# Patient Record
Sex: Female | Born: 1970 | Race: Black or African American | Hispanic: No | Marital: Married | Smoking: Never smoker
Health system: Southern US, Community
[De-identification: ages and names within clinical notes are randomized; demographics above are authoritative.]

## PROBLEM LIST (undated history)

## (undated) DIAGNOSIS — K56609 Unspecified intestinal obstruction, unspecified as to partial versus complete obstruction: Secondary | ICD-10-CM

## (undated) DIAGNOSIS — F329 Major depressive disorder, single episode, unspecified: Secondary | ICD-10-CM

## (undated) DIAGNOSIS — K219 Gastro-esophageal reflux disease without esophagitis: Secondary | ICD-10-CM

## (undated) DIAGNOSIS — D219 Benign neoplasm of connective and other soft tissue, unspecified: Secondary | ICD-10-CM

## (undated) DIAGNOSIS — F32A Depression, unspecified: Secondary | ICD-10-CM

## (undated) DIAGNOSIS — R0602 Shortness of breath: Secondary | ICD-10-CM

## (undated) DIAGNOSIS — G4730 Sleep apnea, unspecified: Secondary | ICD-10-CM

## (undated) DIAGNOSIS — J45909 Unspecified asthma, uncomplicated: Secondary | ICD-10-CM

## (undated) DIAGNOSIS — C801 Malignant (primary) neoplasm, unspecified: Secondary | ICD-10-CM

## (undated) DIAGNOSIS — E669 Obesity, unspecified: Secondary | ICD-10-CM

## (undated) DIAGNOSIS — K589 Irritable bowel syndrome without diarrhea: Secondary | ICD-10-CM

## (undated) DIAGNOSIS — D649 Anemia, unspecified: Secondary | ICD-10-CM

## (undated) DIAGNOSIS — J302 Other seasonal allergic rhinitis: Secondary | ICD-10-CM

## (undated) DIAGNOSIS — G2581 Restless legs syndrome: Secondary | ICD-10-CM

## (undated) DIAGNOSIS — R112 Nausea with vomiting, unspecified: Secondary | ICD-10-CM

## (undated) DIAGNOSIS — C49A0 Gastrointestinal stromal tumor, unspecified site: Secondary | ICD-10-CM

## (undated) DIAGNOSIS — R51 Headache: Secondary | ICD-10-CM

## (undated) DIAGNOSIS — F419 Anxiety disorder, unspecified: Secondary | ICD-10-CM

## (undated) HISTORY — DX: Unspecified intestinal obstruction, unspecified as to partial versus complete obstruction: K56.609

## (undated) HISTORY — DX: Sleep apnea, unspecified: G47.30

## (undated) HISTORY — DX: Gastrointestinal stromal tumor, unspecified site: C49.A0

## (undated) HISTORY — PX: CHOLECYSTECTOMY: SHX55

## (undated) HISTORY — PX: GASTRECTOMY: SHX58

## (undated) HISTORY — PX: LAPAROSCOPIC GASTRIC SLEEVE RESECTION: SHX5895

## (undated) HISTORY — DX: Nausea with vomiting, unspecified: R11.2

## (undated) HISTORY — DX: Irritable bowel syndrome, unspecified: K58.9

## (undated) HISTORY — DX: Benign neoplasm of connective and other soft tissue, unspecified: D21.9

## (undated) HISTORY — DX: Obesity, unspecified: E66.9

---

## 1998-08-01 ENCOUNTER — Encounter: Payer: Self-pay | Admitting: Emergency Medicine

## 1998-08-01 ENCOUNTER — Emergency Department (HOSPITAL_COMMUNITY): Admission: EM | Admit: 1998-08-01 | Discharge: 1998-08-01 | Payer: Self-pay | Admitting: Emergency Medicine

## 1998-10-04 ENCOUNTER — Emergency Department (HOSPITAL_COMMUNITY): Admission: EM | Admit: 1998-10-04 | Discharge: 1998-10-04 | Payer: Self-pay | Admitting: Emergency Medicine

## 2000-01-31 ENCOUNTER — Emergency Department (HOSPITAL_COMMUNITY): Admission: EM | Admit: 2000-01-31 | Discharge: 2000-01-31 | Payer: Self-pay | Admitting: Emergency Medicine

## 2000-08-10 ENCOUNTER — Emergency Department (HOSPITAL_COMMUNITY): Admission: EM | Admit: 2000-08-10 | Discharge: 2000-08-10 | Payer: Self-pay

## 2000-11-24 ENCOUNTER — Encounter: Payer: Self-pay | Admitting: Emergency Medicine

## 2000-11-24 ENCOUNTER — Emergency Department (HOSPITAL_COMMUNITY): Admission: EM | Admit: 2000-11-24 | Discharge: 2000-11-24 | Payer: Self-pay | Admitting: Emergency Medicine

## 2000-12-06 ENCOUNTER — Emergency Department (HOSPITAL_COMMUNITY): Admission: EM | Admit: 2000-12-06 | Discharge: 2000-12-06 | Payer: Self-pay | Admitting: Emergency Medicine

## 2001-04-19 ENCOUNTER — Emergency Department (HOSPITAL_COMMUNITY): Admission: EM | Admit: 2001-04-19 | Discharge: 2001-04-20 | Payer: Self-pay | Admitting: Emergency Medicine

## 2001-06-25 ENCOUNTER — Emergency Department (HOSPITAL_COMMUNITY): Admission: EM | Admit: 2001-06-25 | Discharge: 2001-06-25 | Payer: Self-pay | Admitting: Emergency Medicine

## 2001-11-28 ENCOUNTER — Emergency Department (HOSPITAL_COMMUNITY): Admission: EM | Admit: 2001-11-28 | Discharge: 2001-11-28 | Payer: Self-pay | Admitting: *Deleted

## 2001-11-29 ENCOUNTER — Emergency Department (HOSPITAL_COMMUNITY): Admission: EM | Admit: 2001-11-29 | Discharge: 2001-11-29 | Payer: Self-pay | Admitting: Emergency Medicine

## 2002-03-12 ENCOUNTER — Emergency Department (HOSPITAL_COMMUNITY): Admission: EM | Admit: 2002-03-12 | Discharge: 2002-03-12 | Payer: Self-pay | Admitting: Emergency Medicine

## 2002-03-13 ENCOUNTER — Emergency Department (HOSPITAL_COMMUNITY): Admission: EM | Admit: 2002-03-13 | Discharge: 2002-03-13 | Payer: Self-pay | Admitting: Emergency Medicine

## 2002-06-16 ENCOUNTER — Emergency Department (HOSPITAL_COMMUNITY): Admission: EM | Admit: 2002-06-16 | Discharge: 2002-06-16 | Payer: Self-pay | Admitting: Emergency Medicine

## 2004-03-05 ENCOUNTER — Other Ambulatory Visit: Admission: RE | Admit: 2004-03-05 | Discharge: 2004-03-05 | Payer: Self-pay | Admitting: Family Medicine

## 2005-01-17 ENCOUNTER — Emergency Department (HOSPITAL_COMMUNITY): Admission: EM | Admit: 2005-01-17 | Discharge: 2005-01-17 | Payer: Self-pay | Admitting: Emergency Medicine

## 2005-01-21 ENCOUNTER — Ambulatory Visit: Payer: Self-pay | Admitting: Family Medicine

## 2005-02-04 ENCOUNTER — Encounter: Admission: RE | Admit: 2005-02-04 | Discharge: 2005-02-04 | Payer: Self-pay | Admitting: Family Medicine

## 2005-02-08 ENCOUNTER — Ambulatory Visit: Payer: Self-pay | Admitting: Family Medicine

## 2005-02-15 ENCOUNTER — Ambulatory Visit: Payer: Self-pay | Admitting: Family Medicine

## 2005-04-15 ENCOUNTER — Emergency Department (HOSPITAL_COMMUNITY): Admission: EM | Admit: 2005-04-15 | Discharge: 2005-04-15 | Payer: Self-pay | Admitting: Emergency Medicine

## 2005-12-02 ENCOUNTER — Emergency Department (HOSPITAL_COMMUNITY): Admission: EM | Admit: 2005-12-02 | Discharge: 2005-12-03 | Payer: Self-pay | Admitting: *Deleted

## 2006-03-09 ENCOUNTER — Emergency Department (HOSPITAL_COMMUNITY): Admission: EM | Admit: 2006-03-09 | Discharge: 2006-03-09 | Payer: Self-pay | Admitting: Emergency Medicine

## 2006-08-25 ENCOUNTER — Inpatient Hospital Stay (HOSPITAL_COMMUNITY): Admission: AD | Admit: 2006-08-25 | Discharge: 2006-08-25 | Payer: Self-pay | Admitting: Gynecology

## 2006-08-28 ENCOUNTER — Inpatient Hospital Stay (HOSPITAL_COMMUNITY): Admission: AD | Admit: 2006-08-28 | Discharge: 2006-08-28 | Payer: Self-pay | Admitting: Obstetrics and Gynecology

## 2006-09-11 ENCOUNTER — Ambulatory Visit (HOSPITAL_COMMUNITY): Admission: RE | Admit: 2006-09-11 | Discharge: 2006-09-11 | Payer: Self-pay | Admitting: Gynecology

## 2007-01-04 ENCOUNTER — Encounter (INDEPENDENT_AMBULATORY_CARE_PROVIDER_SITE_OTHER): Payer: Self-pay | Admitting: *Deleted

## 2007-01-04 ENCOUNTER — Inpatient Hospital Stay (HOSPITAL_COMMUNITY): Admission: AD | Admit: 2007-01-04 | Discharge: 2007-01-05 | Payer: Self-pay | Admitting: Obstetrics & Gynecology

## 2008-08-18 ENCOUNTER — Emergency Department (HOSPITAL_COMMUNITY): Admission: EM | Admit: 2008-08-18 | Discharge: 2008-08-18 | Payer: Self-pay | Admitting: Emergency Medicine

## 2009-04-16 ENCOUNTER — Encounter: Admission: RE | Admit: 2009-04-16 | Discharge: 2009-04-16 | Payer: Self-pay | Admitting: Family Medicine

## 2010-05-27 ENCOUNTER — Emergency Department (HOSPITAL_COMMUNITY): Admission: EM | Admit: 2010-05-27 | Discharge: 2010-05-27 | Payer: Self-pay | Admitting: Emergency Medicine

## 2011-11-25 ENCOUNTER — Other Ambulatory Visit (HOSPITAL_COMMUNITY)
Admission: RE | Admit: 2011-11-25 | Discharge: 2011-11-25 | Disposition: A | Discharge status: Home / Self Care | Payer: BC Managed Care – PPO | Source: Ambulatory Visit | Attending: Gynecology | Admitting: Gynecology

## 2011-11-25 ENCOUNTER — Ambulatory Visit (INDEPENDENT_AMBULATORY_CARE_PROVIDER_SITE_OTHER): Payer: BC Managed Care – PPO | Admitting: Gynecology

## 2011-11-25 ENCOUNTER — Encounter: Payer: Self-pay | Admitting: Gynecology

## 2011-11-25 VITALS — BP 130/80 | Ht 63.0 in | Wt 309.0 lb

## 2011-11-25 DIAGNOSIS — N946 Dysmenorrhea, unspecified: Secondary | ICD-10-CM

## 2011-11-25 DIAGNOSIS — N920 Excessive and frequent menstruation with regular cycle: Secondary | ICD-10-CM

## 2011-11-25 DIAGNOSIS — Z131 Encounter for screening for diabetes mellitus: Secondary | ICD-10-CM

## 2011-11-25 DIAGNOSIS — D219 Benign neoplasm of connective and other soft tissue, unspecified: Secondary | ICD-10-CM | POA: Insufficient documentation

## 2011-11-25 DIAGNOSIS — Z01419 Encounter for gynecological examination (general) (routine) without abnormal findings: Secondary | ICD-10-CM

## 2011-11-25 DIAGNOSIS — Z13220 Encounter for screening for lipoid disorders: Secondary | ICD-10-CM

## 2011-11-25 DIAGNOSIS — D259 Leiomyoma of uterus, unspecified: Secondary | ICD-10-CM

## 2011-11-25 LAB — URINALYSIS W MICROSCOPIC + REFLEX CULTURE
Leukocytes, UA: NEGATIVE
Protein, ur: NEGATIVE mg/dL
Urobilinogen, UA: 0.2 mg/dL (ref 0.0–1.0)

## 2011-11-25 LAB — CBC WITH DIFFERENTIAL/PLATELET
Basophils Relative: 0 % (ref 0–1)
Eosinophils Absolute: 0.4 10*3/uL (ref 0.0–0.7)
HCT: 28.7 % — ABNORMAL LOW (ref 36.0–46.0)
Hemoglobin: 8.4 g/dL — ABNORMAL LOW (ref 12.0–15.0)
MCH: 21.2 pg — ABNORMAL LOW (ref 26.0–34.0)
MCHC: 29.3 g/dL — ABNORMAL LOW (ref 30.0–36.0)
Monocytes Absolute: 0.5 10*3/uL (ref 0.1–1.0)
Monocytes Relative: 8 % (ref 3–12)

## 2011-11-25 LAB — LIPID PANEL
Total CHOL/HDL Ratio: 2.9 Ratio
VLDL: 11 mg/dL (ref 0–40)

## 2011-11-25 LAB — GLUCOSE, RANDOM: Glucose, Bld: 83 mg/dL (ref 70–99)

## 2011-11-25 NOTE — Patient Instructions (Signed)
Follow up for your ultrasound study and blood work results.

## 2011-11-25 NOTE — Progress Notes (Signed)
Stacy Hill [birth year] [id number]        41 y.o. new patient for annual exam.  Has several issues as noted below  Past medical history,surgical history, medications, allergies, family history and social history were all reviewed and documented in the EPIC chart. ROS:  Was performed and pertinent positives and negatives are included in the history.  Exam: Kim chaperone present Filed Vitals:   11/25/11 0959  BP: 130/80   General appearance  Normal Skin grossly normal Head/Neck normal with no cervical or supraclavicular adenopathy thyroid normal Lungs  clear Cardiac RR, without RMG Abdominal  soft, nontender, midline 18 week mass consistent with leiomyoma, no organomegaly or hernia Breasts  examined lying and sitting without masses, retractions, discharge or axillary adenopathy. Pelvic  Ext/BUS/vagina  normal   Cervix  normal  Pap done  Uterus  18 weeks a regular consistent with leiomyoma, midline and well baby nontender   Adnexa difficult to evaluate due to uterine size  Anus and perineum  normal   Rectovaginal  normal sphincter tone without palpated masses or tenderness.    Assessment/Plan:  41 y.o. female for annual exam.    1. Leiomyoma. Patient has history of leiomyoma last evaluated 4 years ago. Says her periods are getting worse more crampy and heavier where she wears double protection has bleed through episodes. Exam consistent with 18 week size uterus. We'll start with sonohysterogram although her cervix was somewhat distorted pointing posteriorly which I think is due to the fibroids which may make this more difficult.  Options for management to include observation hormonal manipulation Mirena IUD uterine artery embolization myomectomy and hysterectomy were all discussed. She does not feel childbearing is an issue. Her last pregnancy ended in stillbirth which was traumatic to her and she's not interested in pursuing pregnancy at this point. Possible Depo-Lupron suppression  temporarily or try low-dose oral contraceptives discussed. The side effect profile of the Depo-Lupron was reviewed and she actually  did have a course of Depo-Lupron before and did not like the side effects. Low-dose oral contraceptives was also discussed the side effect risk profile reviewed to include stroke heart attack DVT. She is not being followed for any medical issues does not smoke and is thinking about temporarily trying this. Patient will follow up for her sonohysterogram and then we'll rediscuss options. I'm also checking a TSH and CBC given her menorrhagia history. 2. Birth control. We discussed condom use and the potential for failure and risk of pregnancy. We'll further discuss options after the ultrasound I did review in general options available to her. 3. Pap smear. Pap smear was done today as she has not had one done in a number of years. She has no history of abnormalities previously. 4. Breast self. Screening mammography between 35 and 40 was reviewed. She has no strong family history firstly closer to 40. SBE monthly discussed. 5. Weight loss. Patient is planning on attempting a weight loss regimen and I encouraged her to do so. 6. Health maintenance. We'll check baseline lipid profile glucose hemoglobin A1c and urinalysis along with her CBC and TSH.  Dara Lords MD, 11:10 AM 11/25/2011

## 2011-11-28 ENCOUNTER — Other Ambulatory Visit: Payer: Self-pay | Admitting: *Deleted

## 2011-11-28 DIAGNOSIS — D582 Other hemoglobinopathies: Secondary | ICD-10-CM

## 2011-11-30 ENCOUNTER — Telehealth: Payer: Self-pay | Admitting: *Deleted

## 2011-11-30 DIAGNOSIS — D219 Benign neoplasm of connective and other soft tissue, unspecified: Secondary | ICD-10-CM

## 2011-11-30 NOTE — Telephone Encounter (Signed)
Pt is a new patient with practice had office visit on 2/8. She is scheduled for [facility] on 2/22, pt said that she will have to pay $500 due to insurance. Pt would like to know if there is any other ultrasound that could be done? Please advise

## 2011-12-01 NOTE — Telephone Encounter (Signed)
Pt informed with the below note, order in computer for ultrasound.

## 2011-12-01 NOTE — Telephone Encounter (Signed)
We can do just a regular ultrasound as given her clinical picture I don't think that the sonohysterogram part of it will ultimately change the recommendations.

## 2011-12-09 ENCOUNTER — Other Ambulatory Visit: Payer: BC Managed Care – PPO

## 2011-12-09 ENCOUNTER — Other Ambulatory Visit (HOSPITAL_COMMUNITY)
Admission: RE | Admit: 2011-12-09 | Discharge: 2011-12-09 | Disposition: A | Discharge status: Home / Self Care | Payer: BC Managed Care – PPO | Source: Ambulatory Visit | Attending: Gynecology | Admitting: Gynecology

## 2011-12-09 ENCOUNTER — Encounter: Payer: Self-pay | Admitting: Gynecology

## 2011-12-09 ENCOUNTER — Other Ambulatory Visit: Payer: Self-pay | Admitting: Gynecology

## 2011-12-09 ENCOUNTER — Ambulatory Visit (INDEPENDENT_AMBULATORY_CARE_PROVIDER_SITE_OTHER): Payer: BC Managed Care – PPO

## 2011-12-09 ENCOUNTER — Ambulatory Visit: Payer: BC Managed Care – PPO | Admitting: Gynecology

## 2011-12-09 ENCOUNTER — Ambulatory Visit (INDEPENDENT_AMBULATORY_CARE_PROVIDER_SITE_OTHER): Payer: BC Managed Care – PPO | Admitting: Gynecology

## 2011-12-09 DIAGNOSIS — Z01419 Encounter for gynecological examination (general) (routine) without abnormal findings: Secondary | ICD-10-CM | POA: Insufficient documentation

## 2011-12-09 DIAGNOSIS — D219 Benign neoplasm of connective and other soft tissue, unspecified: Secondary | ICD-10-CM

## 2011-12-09 DIAGNOSIS — N920 Excessive and frequent menstruation with regular cycle: Secondary | ICD-10-CM

## 2011-12-09 DIAGNOSIS — D259 Leiomyoma of uterus, unspecified: Secondary | ICD-10-CM

## 2011-12-09 DIAGNOSIS — R87616 Satisfactory cervical smear but lacking transformation zone: Secondary | ICD-10-CM

## 2011-12-09 DIAGNOSIS — N83209 Unspecified ovarian cyst, unspecified side: Secondary | ICD-10-CM

## 2011-12-09 MED ORDER — ETONOGESTREL-ETHINYL ESTRADIOL 0.12-0.015 MG/24HR VA RING: VAGINAL_RING | VAGINAL | Status: DC

## 2011-12-09 NOTE — Progress Notes (Signed)
  Patient follows up with to issues. The first is her Pap smear needs to be repeated as it had insufficient endocervical cells. Second is her follow up ultrasound due to her leiomyoma menorrhagia. She was initially scheduled for a sonohysterogram but only had the ultrasound done but I think in retrospect that's fine. The ultrasound shows an endometrial echo of 9.0 mm and a single large posterior myoma at 122 mm. Right ovary is normal left ovary has an echo-free thin-walled avascular cyst 30 mm mean. Internal echoes suggestive of hemorrhage versus endometrioma.  Exam with Elane Fritz chaperone present Abdomen with palpable uterus suprapubically. Pelvic external BUS vagina normal. Cervix normal Pap done. Uterus enlarged midline. Adnexa without gross masses.  Assessment and plan: 1. Pap redone. Assuming negative and follow expectantly. 2. Single large leiomyoma. Reviewed options with her to include attempted hormonal manipulation, Lupron suppression, Mirena IUD, myomectomy either robotic attempted versus open, uterine artery embolization, hysterectomy. Patient is interested in at least hearing about robotic myomectomy on going to refer her for this discussion. I reviewed with her that if I did an open myomectomy or hysterectomy that I would do her through a midline incision. She relates that she did have a Pfannenstiel with her C-section and became infected and took months to heal. 3. Contraception. Patient requested contraception. I reviewed options with her to include pill patch ring, Mirena IUD, Depo-Provera, Nexplanon. She's not defaulting medical issues does not smoke does not have hypertension or diabetes. She wants to try the NuvaRing I gave her sample ring with annual refill a starter pack and instructions how to use it. 4. Ovarian cyst. I reviewed the left ovarian cyst. I think it's either a hemorrhagic cyst possible endometrioma. Possibilities of 2 including cancer were reviewed. The recommendation would  be to repeat the ultrasound in 3 months if she does not have surgery before then. If it would enlarge and consider surgery. She agrees with this and the importance of follow up.

## 2011-12-09 NOTE — Patient Instructions (Signed)
Office will contact you to arrange surgical consult in reference to your leiomyoma. Start NuvaRing call if you have any questions or problems. Use backup contraception for at least the first month. If you do not have surgery sooner follow up in 3 months for ultrasound to recheck your left ovary.

## 2011-12-16 ENCOUNTER — Telehealth: Payer: Self-pay | Admitting: *Deleted

## 2011-12-16 NOTE — Telephone Encounter (Signed)
appt set up on 12/21/11 @2 :30 with Dr. Mia Creek.  Patient has been informed by their office.

## 2011-12-16 NOTE — Telephone Encounter (Signed)
Message copied by Libby Maw on Fri Dec 16, 2011 12:08 PM ------      Message from: Dara Lords      Created: Fri Dec 09, 2011  4:22 PM       Patient has single large fibroid and is interested in hearing about robotic myomectomy versus robotic hysterectomy. Make an appointment for her to see Dr. Mia Creek at [facility]

## 2012-01-04 ENCOUNTER — Telehealth: Payer: Self-pay | Admitting: *Deleted

## 2012-01-04 NOTE — Telephone Encounter (Signed)
Patient called to say she was not satisfied with the visit with Dr. Mia Creek and has many more questions and would like to see TF.  Appts to set up ov.

## 2012-01-11 ENCOUNTER — Encounter: Payer: Self-pay | Admitting: Gynecology

## 2012-01-11 ENCOUNTER — Ambulatory Visit (INDEPENDENT_AMBULATORY_CARE_PROVIDER_SITE_OTHER): Payer: BC Managed Care – PPO | Admitting: Gynecology

## 2012-01-11 DIAGNOSIS — D259 Leiomyoma of uterus, unspecified: Secondary | ICD-10-CM

## 2012-01-11 DIAGNOSIS — D500 Iron deficiency anemia secondary to blood loss (chronic): Secondary | ICD-10-CM

## 2012-01-11 DIAGNOSIS — N920 Excessive and frequent menstruation with regular cycle: Secondary | ICD-10-CM

## 2012-01-11 NOTE — Patient Instructions (Signed)
Office will contact you about starting Depo-Lupron. Continue with your iron one pill daily. Use backup contraception while you were on Depo-Lupron.

## 2012-01-11 NOTE — Progress Notes (Signed)
Patient presents having seen Dr. Mia Hill in reference to robotic hysterectomy. I received a copy of his office nowhere he had seen her that she was a candidate for by hysterectomy next that are scheduled for next week. He then called her back and said that he wanted her to lose weight first before proceeding with surgery and canceled her surgery. She was a little put off by this and wanted to check with me.  Exam was Stacy Hill chaperone present Abdomen with palpable uterus at umbilicus Pelvic external BUS vagina normal. Cervix grossly normal. Uterus grossly enlarged prostate 18 week size midline. Adnexa without gross masses.  Assessment and plan: Leiomyoma, iron deficiency anemia, menorrhagia with irregular bleeding. He did do a endometrial biopsy by her history I don't have a copy of this am going to request this. I reviewed options with her and her plan is to lose weight and then proceed with robotic hysterectomy. Apparently her sister had this done at [facility] she's to follow up with that physician to consider this. I did suggest Lupron in the antrum to suppress her bleeding as well as for hemoglobin recovery and she wants to go ahead and do this. We'll make arrangements for Depo-Lupron 11.25. The need for backup contraception on the Lupron was discussed with her. She does have significant hot flashes or symptoms which I discussed with her that we could start her on norethindrone she'll call me. The issues of transient loss of calcium from the bones was also reviewed with her.

## 2012-01-18 ENCOUNTER — Telehealth: Payer: Self-pay | Admitting: *Deleted

## 2012-01-18 NOTE — Telephone Encounter (Signed)
Lupron request sent by [facility] 01/12/12; yesterday I had to fax an order to [facility] per Insurance.

## 2012-01-20 NOTE — Telephone Encounter (Signed)
Pt informed of delivery date 01/24/12. She wants a call once it is here to set up a time to get shot. KW

## 2012-01-24 ENCOUNTER — Other Ambulatory Visit: Payer: Self-pay | Admitting: *Deleted

## 2012-01-24 MED ORDER — LEUPROLIDE ACETATE (3 MONTH) 11.25 MG IM KIT
11.2500 mg | PACK | Freq: Once | INTRAMUSCULAR | Status: AC
  Administered 2012-01-30: 11.25 mg via INTRAMUSCULAR

## 2012-01-24 NOTE — Telephone Encounter (Signed)
Lupron arrived, appt set up 01/27/12 @4pm .

## 2012-01-27 ENCOUNTER — Ambulatory Visit: Payer: BC Managed Care – PPO

## 2012-01-30 ENCOUNTER — Ambulatory Visit (INDEPENDENT_AMBULATORY_CARE_PROVIDER_SITE_OTHER): Payer: BC Managed Care – PPO | Admitting: *Deleted

## 2012-01-30 DIAGNOSIS — D219 Benign neoplasm of connective and other soft tissue, unspecified: Secondary | ICD-10-CM

## 2012-01-30 DIAGNOSIS — D259 Leiomyoma of uterus, unspecified: Secondary | ICD-10-CM

## 2012-02-17 ENCOUNTER — Telehealth: Payer: Self-pay | Admitting: *Deleted

## 2012-02-17 NOTE — Telephone Encounter (Signed)
Dr Audie Box received a letter from [facility] regarding patients recent Lupron injection and it contraindicating with her recent pregnancy. Dr Audie Box asked me to follow up on this with the patient. I called the patient to verify that she isnt pregnant or recently been pregnant without our knowledge. She stated "no". I went onto tell her why I was asking and therefore I called [facility] and they said they are forwarded diagnosis codes from her medical insurance. I Spoke with Ree Kida at [employer] and he documented that the patient was not pregnant. I called the patient back and told her that she would need to follow up with her medical insurance plan to look at those DOS. 11/04/11 and 11/08/11. These are the dates that [facility] have the diagnosis of pregnancy. The patient said she would call. She states she is on her first period since receiving the Lupron injection. I told her if its abnormal or has irregular bleeding then to let us know other wise this period is normal.

## 2012-05-23 ENCOUNTER — Ambulatory Visit (HOSPITAL_BASED_OUTPATIENT_CLINIC_OR_DEPARTMENT_OTHER): Payer: BC Managed Care – PPO | Attending: Internal Medicine | Admitting: Radiology

## 2012-05-23 VITALS — Ht 63.0 in | Wt 297.0 lb

## 2012-05-23 DIAGNOSIS — G4700 Insomnia, unspecified: Secondary | ICD-10-CM | POA: Insufficient documentation

## 2012-05-23 DIAGNOSIS — G4733 Obstructive sleep apnea (adult) (pediatric): Secondary | ICD-10-CM

## 2012-05-23 DIAGNOSIS — G4730 Sleep apnea, unspecified: Secondary | ICD-10-CM | POA: Insufficient documentation

## 2012-05-23 DIAGNOSIS — G4761 Periodic limb movement disorder: Secondary | ICD-10-CM | POA: Insufficient documentation

## 2012-06-03 DIAGNOSIS — G4730 Sleep apnea, unspecified: Secondary | ICD-10-CM

## 2012-06-03 DIAGNOSIS — G4761 Periodic limb movement disorder: Secondary | ICD-10-CM

## 2012-06-03 DIAGNOSIS — G4700 Insomnia, unspecified: Secondary | ICD-10-CM

## 2012-06-04 NOTE — Procedures (Signed)
NAME:  Stacy Hill, Stacy Hill           ACCOUNT NO.:  [id number]  MEDICAL RECORD NO.:  [id number]          PATIENT TYPE:  OUT  LOCATION:  SLEEP CENTER                 FACILITY:  [facility]  PHYSICIAN:  Breann Losano D. Maple Hudson, MD, FCCP, FACPDATE OF BIRTH:  25-Feb-1971  DATE OF STUDY:  05/23/2012                           NOCTURNAL POLYSOMNOGRAM  REFERRING PHYSICIAN:  Donnel Saxon, MD  INDICATION FOR STUDY:  Insomnia with sleep apnea.  EPWORTH SLEEPINESS SCORE:  11/24, BMI 52.6, weight 297 pounds, height 63 inches, neck 13 inches.  MEDICATIONS:  Home medications are charted and reviewed.  SLEEP ARCHITECTURE:  Total sleep time 283 minutes with sleep efficiency 79.2%.  Stage I was 10.4%, stage II 71.6%.  Stage III 0.2%, REM 17.8% of total sleep time.  Sleep latency 31.5 minutes, REM latency 179.5 minutes, awake after sleep onset 34.5 minutes.  Arousal index 23.1.  Bedtime Medication:  None.  RESPIRATORY DATA:  Apnea hypopnea index (AHI) 1.1 per hour.  A total of 5 events was scored including 2 central apneas and 3 hypopneas.  Events were not positional.  REM AHI 2.4 per hour.  OXYGEN DATA:  Snoring was absent to occasionally moderate with oxygen desaturation to a nadir of 92% and mean oxygen saturation on room air 98.1%.  CARDIAC DATA:  Normal sinus rhythm.  MOVEMENT-PARASOMNIA:  A total of 46 limb jerks were counted, of which 25 were associated with arousals or awakenings for periodic limb movement with arousal index of 5.3 per hour.  No bathroom trips.  IMPRESSION-RECOMMENDATIONS: 1. Occasional respiratory events with sleep disturbance, within normal     limits.  AHI 1.1 per hour (the normal range for adult is from 0-5     events per hour).  Occasional snoring was moderate with oxygen     desaturation to a nadir of 92% and mean oxygen saturation through     the study of 98.1% on room air.  This was ordered as a diagnostic     NPSG protocol and CPAP titration was not done. 2. Periodic limb  movement with arousal syndrome.  A total of 46 limb     jerks were counted, of which 25 were associated with arousals or     awakening for periodic limb movements with arousal index of 5.3 per     hour.  If     limb jerks are a significant cause of sleep disturbance in the home     environment, then a therapeutic trial of specific medication such     as ReQuip or Mirapex might be appropriate if clinically indicated.     Enid Maultsby D. Maple Hudson, MD, [facility], FACP Diplomate, American Board of Sleep Medicine    CDY/MEDQ  D:  06/03/2012 13:15:23  T:  06/04/2012 03:19:56  Job:  [id number]

## 2012-12-10 ENCOUNTER — Encounter: Payer: Self-pay | Admitting: Gynecology

## 2012-12-10 ENCOUNTER — Ambulatory Visit (INDEPENDENT_AMBULATORY_CARE_PROVIDER_SITE_OTHER): Payer: BC Managed Care – PPO | Admitting: Gynecology

## 2012-12-10 VITALS — BP 120/78 | Ht 63.0 in | Wt 296.0 lb

## 2012-12-10 DIAGNOSIS — Z13220 Encounter for screening for lipoid disorders: Secondary | ICD-10-CM

## 2012-12-10 DIAGNOSIS — Z01419 Encounter for gynecological examination (general) (routine) without abnormal findings: Secondary | ICD-10-CM

## 2012-12-10 DIAGNOSIS — D259 Leiomyoma of uterus, unspecified: Secondary | ICD-10-CM

## 2012-12-10 LAB — CBC WITH DIFFERENTIAL/PLATELET
Eosinophils Absolute: 0.2 10*3/uL (ref 0.0–0.7)
Eosinophils Relative: 4 % (ref 0–5)
Lymphs Abs: 2.5 10*3/uL (ref 0.7–4.0)
MCH: 25.5 pg — ABNORMAL LOW (ref 26.0–34.0)
MCV: 80.1 fL (ref 78.0–100.0)
Monocytes Absolute: 0.3 10*3/uL (ref 0.1–1.0)
Platelets: 330 10*3/uL (ref 150–400)
RBC: 4.28 MIL/uL (ref 3.87–5.11)

## 2012-12-10 NOTE — Patient Instructions (Signed)
Follow up for ultrasound as scheduled 

## 2012-12-10 NOTE — Progress Notes (Signed)
Stacy Hill January 10, 1971 [id number]        42 y.o.  G2P1101 for annual exam.  Several issues noted below.  Past medical history,surgical history, medications, allergies, family history and social history were all reviewed and documented in the EPIC chart. ROS:  Was performed and pertinent positives and negatives are included in the history.  Exam: Kim assistant Filed Vitals:   12/10/12 1000  BP: 120/78  Height: 5\' 3"  (1.6 m)  Weight: 296 lb (134.265 kg)   General appearance  Normal Skin grossly normal Head/Neck normal with no cervical or supraclavicular adenopathy thyroid normal Lungs  clear Cardiac RR, without RMG Abdominal  Obese, soft, nontender, without masses, organomegaly or hernia. Breasts  examined lying and sitting without masses, retractions, discharge or axillary adenopathy. Pelvic  Ext/BUS/vagina  normal ]\  Cervix  normal   Uterus  Grossly enlarged difficult to palpate due to obesity.  Adnexa  Difficult to assess. No gross masses or tenderness   Anus and perineum  normal   Rectovaginal  normal sphincter tone without palpated masses or tenderness.    Assessment/Plan:  42 y.o. G72P1101 female for annual exam.   1. Menorrhagia/frequent menses. History of large single myoma 12 cm. Had seen Dr. Pura Spice with tentative plans for robotic hysterectomy but ultimately was canceled and the patient was told to lose weight and then return. Periods still continue heavy and frequent. Check CBC TSH today. I again reviewed options of myomectomy versus hysterectomy. She strongly wants to proceed with hysterectomy. I reviewed abdominal versus attempted robotic. She did have a cesarean section with wound breakdown via Pfannenstiel incision afterwards.  I reviewed the options of midline incision but the risk of wound complications following the procedure. Patient wants to pursue attempted robotic and I will discuss with robotic surgeons to see where I can refer her.  Will repeat  ultrasound now for pelvic assessment as exam is limited. 2. Mammography. Patient turns 42 this year I recommended screening mammogram as she agrees to arrange this. SBE monthly reviewed. 3. Pap smear or 2013. No Pap smear done today. No history of abnormal Pap smears previously. Plan 3 year interval repeat. 4. Health maintenance. Will check baseline CBC, comprehensive metabolic panel, TSH urinalysis. Follow up for ultrasound.    Dara Lords MD, 11:15 AM 12/10/2012

## 2012-12-11 LAB — URINALYSIS W MICROSCOPIC + REFLEX CULTURE
Bacteria, UA: NONE SEEN
Bilirubin Urine: NEGATIVE
Crystals: NONE SEEN
Ketones, ur: NEGATIVE mg/dL
Nitrite: NEGATIVE
Specific Gravity, Urine: 1.025 (ref 1.005–1.030)
pH: 6 (ref 5.0–8.0)

## 2012-12-11 LAB — LIPID PANEL
Cholesterol: 126 mg/dL (ref 0–200)
HDL: 41 mg/dL (ref >39)
Total CHOL/HDL Ratio: 3.1 Ratio
Triglycerides: 53 mg/dL (ref <150)
VLDL: 11 mg/dL (ref 0–40)

## 2012-12-11 LAB — COMPREHENSIVE METABOLIC PANEL
ALT: 14 U/L (ref 0–35)
BUN: 10 mg/dL (ref 6–23)
CO2: 23 mEq/L (ref 19–32)
Creat: 0.54 mg/dL (ref 0.50–1.10)
Glucose, Bld: 81 mg/dL (ref 70–99)
Total Bilirubin: 0.3 mg/dL (ref 0.3–1.2)

## 2012-12-11 LAB — URINE CULTURE: Organism ID, Bacteria: NO GROWTH

## 2012-12-19 ENCOUNTER — Other Ambulatory Visit: Payer: BC Managed Care – PPO

## 2012-12-19 ENCOUNTER — Ambulatory Visit: Payer: BC Managed Care – PPO | Admitting: Gynecology

## 2012-12-28 ENCOUNTER — Other Ambulatory Visit: Payer: Self-pay | Admitting: Gynecology

## 2012-12-28 ENCOUNTER — Ambulatory Visit: Payer: BC Managed Care – PPO

## 2012-12-28 ENCOUNTER — Encounter: Payer: Self-pay | Admitting: Gynecology

## 2012-12-28 ENCOUNTER — Ambulatory Visit: Payer: BC Managed Care – PPO | Admitting: Gynecology

## 2012-12-28 DIAGNOSIS — D259 Leiomyoma of uterus, unspecified: Secondary | ICD-10-CM

## 2012-12-28 DIAGNOSIS — N852 Hypertrophy of uterus: Secondary | ICD-10-CM

## 2012-12-28 DIAGNOSIS — N920 Excessive and frequent menstruation with regular cycle: Secondary | ICD-10-CM

## 2012-12-28 NOTE — Progress Notes (Signed)
Patient goals up for ultrasound. History of leiomyoma and menorrhagia.  Ultrasound shows uterus enlarged with single myoma measuring 16 mm. Right left ovaries are visualized and normal. Cul-de-sac negative.   Assessment and plan: Large single myoma, menorrhagia wants to proceed with hysterectomy.  I again reviewed options to include medical management versus myomectomy versus hysterectomy she strongly wants to proceed with hysterectomy. I have discussed with Dr. Seymour Bars who has extensive experience with robotic surgery her case and she feels comfortable evaluating her for possible robotic hysterectomy with a large myoma in a morbidly obese patient.  Patient agrees with following up with her for further evaluation and possible surgery. We'll go ahead and help her make these arrangements.

## 2012-12-28 NOTE — Patient Instructions (Signed)
Office will contact you with Dr. Sharol Roussel appointment information.

## 2012-12-31 ENCOUNTER — Telehealth: Payer: Self-pay | Admitting: *Deleted

## 2012-12-31 NOTE — Telephone Encounter (Signed)
appt on 01/16/13 @ 10:30 am unable to leave voicemail on moblie due to vm not set up. Home # is no longer working, and work # no one answers the phone. Will try back later at [facility] #

## 2012-12-31 NOTE — Telephone Encounter (Signed)
Message copied by Aura Camps on Mon Dec 31, 2012 10:38 AM ------      Message from: Dara Lords      Created: Fri Dec 28, 2012  1:05 PM       Help patient arrange appointment with Dr.Lavoie at [facility] reference referral for robotic hysterectomy, obese patient with large myoma. I have talked to Dr. Seymour Bars personally and she agreed to see this patient. ------

## 2012-12-31 NOTE — Telephone Encounter (Signed)
Pt informed with the below note. 

## 2012-12-31 NOTE — Telephone Encounter (Signed)
Notes faxed to office

## 2013-01-17 ENCOUNTER — Other Ambulatory Visit: Payer: Self-pay | Admitting: Obstetrics & Gynecology

## 2013-01-17 DIAGNOSIS — D219 Benign neoplasm of connective and other soft tissue, unspecified: Secondary | ICD-10-CM

## 2013-01-22 ENCOUNTER — Encounter (HOSPITAL_COMMUNITY): Payer: Self-pay | Admitting: Pharmacy Technician

## 2013-01-23 ENCOUNTER — Ambulatory Visit
Admission: RE | Admit: 2013-01-23 | Discharge: 2013-01-23 | Disposition: A | Discharge status: Home / Self Care | Payer: BC Managed Care – PPO | Source: Ambulatory Visit | Attending: Obstetrics & Gynecology | Admitting: Obstetrics & Gynecology

## 2013-01-23 DIAGNOSIS — D219 Benign neoplasm of connective and other soft tissue, unspecified: Secondary | ICD-10-CM

## 2013-01-23 MED ORDER — GADOBENATE DIMEGLUMINE 529 MG/ML IV SOLN
20.0000 mL | Freq: Once | INTRAVENOUS | Status: AC | PRN
  Administered 2013-01-23: 20 mL via INTRAVENOUS

## 2013-01-28 ENCOUNTER — Encounter (HOSPITAL_COMMUNITY)
Admission: RE | Admit: 2013-01-28 | Discharge: 2013-01-28 | Disposition: A | Discharge status: Home / Self Care | Payer: BC Managed Care – PPO | Source: Ambulatory Visit | Attending: Obstetrics & Gynecology | Admitting: Obstetrics & Gynecology

## 2013-01-28 ENCOUNTER — Encounter (HOSPITAL_COMMUNITY): Payer: Self-pay

## 2013-01-28 DIAGNOSIS — Z01818 Encounter for other preprocedural examination: Secondary | ICD-10-CM | POA: Insufficient documentation

## 2013-01-28 DIAGNOSIS — Z01812 Encounter for preprocedural laboratory examination: Secondary | ICD-10-CM | POA: Insufficient documentation

## 2013-01-28 HISTORY — DX: Restless legs syndrome: G25.81

## 2013-01-28 HISTORY — DX: Shortness of breath: R06.02

## 2013-01-28 HISTORY — DX: Gastro-esophageal reflux disease without esophagitis: K21.9

## 2013-01-28 HISTORY — DX: Anemia, unspecified: D64.9

## 2013-01-28 HISTORY — DX: Anxiety disorder, unspecified: F41.9

## 2013-01-28 HISTORY — DX: Depression, unspecified: F32.A

## 2013-01-28 HISTORY — DX: Headache: R51

## 2013-01-28 HISTORY — DX: Major depressive disorder, single episode, unspecified: F32.9

## 2013-01-28 HISTORY — DX: Other seasonal allergic rhinitis: J30.2

## 2013-01-28 LAB — CBC
Hemoglobin: 11.5 g/dL — ABNORMAL LOW (ref 12.0–15.0)
MCH: 27.9 pg (ref 26.0–34.0)
MCHC: 32.4 g/dL (ref 30.0–36.0)
MCV: 86.2 fL (ref 78.0–100.0)
RBC: 4.12 MIL/uL (ref 3.87–5.11)

## 2013-01-28 NOTE — Patient Instructions (Addendum)
    Your procedure is scheduled on:Wednesday, April 30th Enter through the [employer] of [facility] at: [employer] up the phone at the desk and dial [phone] and inform us of your arrival.  Please call this number if you have any problems the morning of surgery: [phone]  Remember: Do not eat or drink anything after midnight on Tuesday. Please take these medications morning of surgery: Prozac with sips of water.  Do not wear jewelry, make-up, or FINGER nail polish No metal in your hair or on your body. Do not wear lotions, powders, perfumes. You may wear deodorant.  Please use your CHG wash as directed prior to surgery.  Do not shave anywhere for at least 12 hours prior to first CHG shower.  Do not bring valuables to the hospital. Contacts may not be worn to OR.  Leave suitcase in the car. After Surgery it may be brought to your room.  For patients being admitted to the hospital, checkout time is 11:00am the day of discharge.   For patients being discharged-you must have a ride home.

## 2013-01-31 ENCOUNTER — Other Ambulatory Visit: Payer: Self-pay | Admitting: Obstetrics & Gynecology

## 2013-02-12 ENCOUNTER — Encounter (HOSPITAL_COMMUNITY): Payer: Self-pay | Admitting: Anesthesiology

## 2013-02-12 MED ORDER — CEFOXITIN SODIUM 2 G IV SOLR
2.0000 g | Freq: Once | INTRAVENOUS | Status: AC
  Administered 2013-02-13: 2 g via INTRAVENOUS
  Filled 2013-02-12: qty 2

## 2013-02-12 NOTE — Anesthesia Preprocedure Evaluation (Addendum)
Anesthesia Evaluation    Airway Mallampati: III TM Distance: >3 FB Neck ROM: Full    Dental no notable dental hx. (+) Teeth Intact   Pulmonary shortness of breath,  breath sounds clear to auscultation  Pulmonary exam normal       Cardiovascular negative cardio ROS  Rhythm:Regular Rate:Normal     Neuro/Psych  Headaches, PSYCHIATRIC DISORDERS Anxiety Depression Restless Legs Syndrome    GI/Hepatic Neg liver ROS, GERD-  Medicated and Controlled,  Endo/Other  Morbid obesity  Renal/GU negative Renal ROS     Musculoskeletal negative musculoskeletal ROS (+)   Abdominal (+) + obese,   Peds  Hematology  (+) Blood dyscrasia, anemia ,   Anesthesia Other Findings   Reproductive/Obstetrics Large Symptomatic fibroid Anemia Menorrhagia                          Anesthesia Physical Anesthesia Plan  ASA: III  Anesthesia Plan: General   Post-op Pain Management:    Induction: Intravenous  Airway Management Planned: Oral ETT  Additional Equipment:   Intra-op Plan:   Post-operative Plan: Extubation in OR  Informed Consent: I have reviewed the patients History and Physical, chart, labs and discussed the procedure including the risks, benefits and alternatives for the proposed anesthesia with the patient or authorized representative who has indicated his/her understanding and acceptance.   Dental advisory given  Plan Discussed with: CRNA, Anesthesiologist and Surgeon  Anesthesia Plan Comments: (Risks of anesthesia for robotic surgery including but not limited to airway edema, post-op ventilatory support, post op visual loss, nausea/vomiting, difficulty with intubation,dental damage and aspiration discussed with patient in detail. )       Anesthesia Quick Evaluation

## 2013-02-13 ENCOUNTER — Encounter (HOSPITAL_COMMUNITY): Payer: Self-pay | Admitting: *Deleted

## 2013-02-13 ENCOUNTER — Ambulatory Visit (HOSPITAL_COMMUNITY): Payer: BC Managed Care – PPO | Admitting: Anesthesiology

## 2013-02-13 ENCOUNTER — Inpatient Hospital Stay (HOSPITAL_COMMUNITY)
Admission: RE | Admit: 2013-02-13 | Discharge: 2013-02-15 | DRG: 359 | Disposition: A | Discharge status: Home / Self Care | Payer: BC Managed Care – PPO | Source: Ambulatory Visit | Attending: Obstetrics & Gynecology | Admitting: Obstetrics & Gynecology

## 2013-02-13 ENCOUNTER — Encounter (HOSPITAL_COMMUNITY): Payer: Self-pay | Admitting: Anesthesiology

## 2013-02-13 ENCOUNTER — Encounter (HOSPITAL_COMMUNITY)
Admission: RE | Disposition: A | Discharge status: Home / Self Care | Payer: Self-pay | Source: Ambulatory Visit | Attending: Obstetrics & Gynecology

## 2013-02-13 DIAGNOSIS — D6489 Other specified anemias: Secondary | ICD-10-CM | POA: Diagnosis present

## 2013-02-13 DIAGNOSIS — N736 Female pelvic peritoneal adhesions (postinfective): Secondary | ICD-10-CM | POA: Diagnosis present

## 2013-02-13 DIAGNOSIS — Z90710 Acquired absence of both cervix and uterus: Secondary | ICD-10-CM | POA: Diagnosis not present

## 2013-02-13 DIAGNOSIS — N8000 Endometriosis of the uterus, unspecified: Secondary | ICD-10-CM | POA: Diagnosis present

## 2013-02-13 DIAGNOSIS — Z5331 Laparoscopic surgical procedure converted to open procedure: Secondary | ICD-10-CM

## 2013-02-13 DIAGNOSIS — N840 Polyp of corpus uteri: Secondary | ICD-10-CM | POA: Diagnosis present

## 2013-02-13 DIAGNOSIS — K219 Gastro-esophageal reflux disease without esophagitis: Secondary | ICD-10-CM | POA: Diagnosis present

## 2013-02-13 DIAGNOSIS — N920 Excessive and frequent menstruation with regular cycle: Secondary | ICD-10-CM | POA: Diagnosis present

## 2013-02-13 DIAGNOSIS — D250 Submucous leiomyoma of uterus: Secondary | ICD-10-CM | POA: Diagnosis present

## 2013-02-13 DIAGNOSIS — D251 Intramural leiomyoma of uterus: Principal | ICD-10-CM | POA: Diagnosis present

## 2013-02-13 DIAGNOSIS — N838 Other noninflammatory disorders of ovary, fallopian tube and broad ligament: Secondary | ICD-10-CM | POA: Diagnosis present

## 2013-02-13 DIAGNOSIS — D252 Subserosal leiomyoma of uterus: Secondary | ICD-10-CM | POA: Diagnosis present

## 2013-02-13 HISTORY — PX: ABDOMINAL HYSTERECTOMY: SHX81

## 2013-02-13 LAB — CREATININE, SERUM
GFR calc Af Amer: >90 mL/min (ref >90)
GFR calc non Af Amer: >90 mL/min (ref >90)

## 2013-02-13 LAB — CBC
HCT: 33.2 % — ABNORMAL LOW (ref 36.0–46.0)
MCV: 86.9 fL (ref 78.0–100.0)
RBC: 3.82 MIL/uL — ABNORMAL LOW (ref 3.87–5.11)
WBC: 11.5 10*3/uL — ABNORMAL HIGH (ref 4.0–10.5)

## 2013-02-13 LAB — ABO/RH: ABO/RH(D): B POS

## 2013-02-13 LAB — TYPE AND SCREEN
ABO/RH(D): B POS
Antibody Screen: NEGATIVE

## 2013-02-13 SURGERY — HYSTERECTOMY, ABDOMINAL
Anesthesia: General | Site: Abdomen | Wound class: Clean Contaminated

## 2013-02-13 MED ORDER — HYDROMORPHONE HCL PF 1 MG/ML IJ SOLN
INTRAMUSCULAR | Status: AC
  Administered 2013-02-13: 1 mg via INTRAVENOUS
  Filled 2013-02-13: qty 1

## 2013-02-13 MED ORDER — ONDANSETRON HCL 4 MG/2ML IJ SOLN: 4.0000 mg | Freq: Four times a day (QID) | INTRAMUSCULAR | Status: DC | PRN

## 2013-02-13 MED ORDER — ARTIFICIAL TEARS OP OINT
TOPICAL_OINTMENT | OPHTHALMIC | Status: DC | PRN
  Administered 2013-02-13: 1 via OPHTHALMIC

## 2013-02-13 MED ORDER — LIDOCAINE HCL (CARDIAC) 20 MG/ML IV SOLN
INTRAVENOUS | Status: DC | PRN
  Administered 2013-02-13: 50 mg via INTRAVENOUS

## 2013-02-13 MED ORDER — SCOPOLAMINE 1 MG/3DAYS TD PT72
1.0000 | MEDICATED_PATCH | TRANSDERMAL | Status: DC
Start: 2013-02-13 — End: 2013-02-13

## 2013-02-13 MED ORDER — DEXAMETHASONE SODIUM PHOSPHATE 10 MG/ML IJ SOLN
INTRAMUSCULAR | Status: AC
  Filled 2013-02-13: qty 1

## 2013-02-13 MED ORDER — LACTATED RINGERS IV SOLN
INTRAVENOUS | Status: DC
  Administered 2013-02-13 (×2): via INTRAVENOUS

## 2013-02-13 MED ORDER — EPHEDRINE SULFATE 50 MG/ML IJ SOLN
INTRAMUSCULAR | Status: DC | PRN
  Administered 2013-02-13 (×2): 10 mg via INTRAVENOUS

## 2013-02-13 MED ORDER — LACTATED RINGERS IR SOLN: Status: DC | PRN

## 2013-02-13 MED ORDER — FERROUS SULFATE 325 (65 FE) MG PO TABS
325.0000 mg | ORAL_TABLET | Freq: Three times a day (TID) | ORAL | Status: DC
  Administered 2013-02-14 – 2013-02-15 (×4): 325 mg via ORAL
  Filled 2013-02-13 (×4): qty 1

## 2013-02-13 MED ORDER — METOCLOPRAMIDE HCL 5 MG/ML IJ SOLN: 10.0000 mg | Freq: Once | INTRAMUSCULAR | Status: DC | PRN

## 2013-02-13 MED ORDER — FLUOXETINE HCL 10 MG PO CAPS
10.0000 mg | ORAL_CAPSULE | Freq: Three times a day (TID) | ORAL | Status: DC
  Administered 2013-02-14 – 2013-02-15 (×4): 10 mg via ORAL
  Filled 2013-02-13 (×9): qty 1

## 2013-02-13 MED ORDER — ARTIFICIAL TEARS OP OINT
TOPICAL_OINTMENT | OPHTHALMIC | Status: AC
  Filled 2013-02-13: qty 3.5

## 2013-02-13 MED ORDER — OXYCODONE-ACETAMINOPHEN 5-325 MG PO TABS
1.0000 | ORAL_TABLET | ORAL | Status: DC | PRN
  Administered 2013-02-14 – 2013-02-15 (×5): 2 via ORAL
  Filled 2013-02-13 (×6): qty 2

## 2013-02-13 MED ORDER — PHENYLEPHRINE 40 MCG/ML (10ML) SYRINGE FOR IV PUSH (FOR BLOOD PRESSURE SUPPORT)
PREFILLED_SYRINGE | INTRAVENOUS | Status: AC
  Filled 2013-02-13: qty 5

## 2013-02-13 MED ORDER — DIPHENHYDRAMINE HCL 50 MG/ML IJ SOLN: 12.5000 mg | Freq: Four times a day (QID) | INTRAMUSCULAR | Status: DC | PRN

## 2013-02-13 MED ORDER — DEXTROSE 5 % IV SOLN
2.0000 g | Freq: Once | INTRAVENOUS | Status: AC
  Administered 2013-02-13: 2 g via INTRAVENOUS
  Filled 2013-02-13: qty 2

## 2013-02-13 MED ORDER — VASOPRESSIN 20 UNIT/ML IJ SOLN
INTRAMUSCULAR | Status: AC
  Filled 2013-02-13: qty 1

## 2013-02-13 MED ORDER — FENTANYL CITRATE 0.05 MG/ML IJ SOLN
INTRAMUSCULAR | Status: AC
  Administered 2013-02-13: 50 ug via INTRAVENOUS
  Filled 2013-02-13: qty 2

## 2013-02-13 MED ORDER — DEXAMETHASONE SODIUM PHOSPHATE 4 MG/ML IJ SOLN
INTRAMUSCULAR | Status: DC | PRN
  Administered 2013-02-13: 8 mg via INTRAVENOUS

## 2013-02-13 MED ORDER — FENTANYL CITRATE 0.05 MG/ML IJ SOLN
INTRAMUSCULAR | Status: DC | PRN
  Administered 2013-02-13: 100 ug via INTRAVENOUS
  Administered 2013-02-13: 150 ug via INTRAVENOUS

## 2013-02-13 MED ORDER — ACETAMINOPHEN 10 MG/ML IV SOLN
INTRAVENOUS | Status: AC
  Filled 2013-02-13: qty 100

## 2013-02-13 MED ORDER — PHENYLEPHRINE HCL 10 MG/ML IJ SOLN
INTRAMUSCULAR | Status: DC | PRN
  Administered 2013-02-13 (×2): .08 mg via INTRAVENOUS
  Administered 2013-02-13: .04 mg via INTRAVENOUS

## 2013-02-13 MED ORDER — EPHEDRINE 5 MG/ML INJ
INTRAVENOUS | Status: AC
  Filled 2013-02-13: qty 10

## 2013-02-13 MED ORDER — HYDROMORPHONE 0.3 MG/ML IV SOLN
INTRAVENOUS | Status: DC
  Administered 2013-02-13: 4.5 mg via INTRAVENOUS
  Administered 2013-02-13: 9 mg via INTRAVENOUS
  Administered 2013-02-14: 0.9 mg via INTRAVENOUS
  Administered 2013-02-14: 1.2 mg via INTRAVENOUS
  Filled 2013-02-13: qty 25

## 2013-02-13 MED ORDER — HYDROMORPHONE HCL PF 1 MG/ML IJ SOLN: 1.0000 mg | Freq: Once | INTRAMUSCULAR | Status: AC

## 2013-02-13 MED ORDER — ONDANSETRON HCL 4 MG/2ML IJ SOLN
INTRAMUSCULAR | Status: DC | PRN
  Administered 2013-02-13: 4 mg via INTRAVENOUS

## 2013-02-13 MED ORDER — MEPERIDINE HCL 25 MG/ML IJ SOLN: 6.2500 mg | INTRAMUSCULAR | Status: DC | PRN

## 2013-02-13 MED ORDER — SODIUM CHLORIDE 0.9 % IJ SOLN: 9.0000 mL | INTRAMUSCULAR | Status: DC | PRN

## 2013-02-13 MED ORDER — DIPHENHYDRAMINE HCL 12.5 MG/5ML PO ELIX
12.5000 mg | ORAL_SOLUTION | Freq: Four times a day (QID) | ORAL | Status: DC | PRN
  Administered 2013-02-13 – 2013-02-14 (×3): 12.5 mg via ORAL
  Filled 2013-02-13 (×3): qty 5

## 2013-02-13 MED ORDER — BUPIVACAINE LIPOSOME 1.3 % IJ SUSP
20.0000 mL | Freq: Once | INTRAMUSCULAR | Status: DC
  Filled 2013-02-13: qty 20

## 2013-02-13 MED ORDER — ROCURONIUM BROMIDE 50 MG/5ML IV SOLN
INTRAVENOUS | Status: AC
  Filled 2013-02-13: qty 1

## 2013-02-13 MED ORDER — HYDROMORPHONE HCL PF 1 MG/ML IJ SOLN
INTRAMUSCULAR | Status: AC
  Filled 2013-02-13: qty 1

## 2013-02-13 MED ORDER — BUPIVACAINE HCL (PF) 0.25 % IJ SOLN
INTRAMUSCULAR | Status: AC
  Filled 2013-02-13: qty 30

## 2013-02-13 MED ORDER — ACETAMINOPHEN 10 MG/ML IV SOLN
1000.0000 mg | Freq: Once | INTRAVENOUS | Status: AC
  Administered 2013-02-13: 1000 mg via INTRAVENOUS

## 2013-02-13 MED ORDER — ONDANSETRON HCL 4 MG/2ML IJ SOLN
INTRAMUSCULAR | Status: AC
  Filled 2013-02-13: qty 2

## 2013-02-13 MED ORDER — LACTATED RINGERS IV SOLN
INTRAVENOUS | Status: DC
  Administered 2013-02-13 – 2013-02-14 (×2): via INTRAVENOUS

## 2013-02-13 MED ORDER — HYDROMORPHONE HCL PF 1 MG/ML IJ SOLN
INTRAMUSCULAR | Status: DC | PRN
  Administered 2013-02-13 (×2): 0.5 mg via INTRAVENOUS

## 2013-02-13 MED ORDER — SCOPOLAMINE 1 MG/3DAYS TD PT72
MEDICATED_PATCH | TRANSDERMAL | Status: AC
  Filled 2013-02-13: qty 1

## 2013-02-13 MED ORDER — BUPIVACAINE HCL (PF) 0.25 % IJ SOLN
INTRAMUSCULAR | Status: DC | PRN
  Administered 2013-02-13: 20 mL

## 2013-02-13 MED ORDER — ROCURONIUM BROMIDE 100 MG/10ML IV SOLN
INTRAVENOUS | Status: DC | PRN
  Administered 2013-02-13: 50 mg via INTRAVENOUS
  Administered 2013-02-13: 20 mg via INTRAVENOUS

## 2013-02-13 MED ORDER — MIDAZOLAM HCL 5 MG/5ML IJ SOLN
INTRAMUSCULAR | Status: DC | PRN
  Administered 2013-02-13: 2 mg via INTRAVENOUS

## 2013-02-13 MED ORDER — FENTANYL CITRATE 0.05 MG/ML IJ SOLN
INTRAMUSCULAR | Status: AC
  Filled 2013-02-13: qty 5

## 2013-02-13 MED ORDER — MIDAZOLAM HCL 2 MG/2ML IJ SOLN
INTRAMUSCULAR | Status: AC
  Filled 2013-02-13: qty 2

## 2013-02-13 MED ORDER — HEPARIN SODIUM (PORCINE) 5000 UNIT/ML IJ SOLN
5000.0000 [IU] | Freq: Three times a day (TID) | INTRAMUSCULAR | Status: DC
  Administered 2013-02-13 – 2013-02-15 (×6): 5000 [IU] via SUBCUTANEOUS
  Filled 2013-02-13 (×9): qty 1

## 2013-02-13 MED ORDER — BUPIVACAINE LIPOSOME 1.3 % IJ SUSP
INTRAMUSCULAR | Status: DC | PRN
  Administered 2013-02-13: 20 mL

## 2013-02-13 MED ORDER — GLYCOPYRROLATE 0.2 MG/ML IJ SOLN
INTRAMUSCULAR | Status: DC | PRN
  Administered 2013-02-13: 0.2 mg via INTRAVENOUS
  Administered 2013-02-13: 0.4 mg via INTRAVENOUS

## 2013-02-13 MED ORDER — LIDOCAINE HCL (CARDIAC) 20 MG/ML IV SOLN
INTRAVENOUS | Status: AC
  Filled 2013-02-13: qty 5

## 2013-02-13 MED ORDER — GLYCOPYRROLATE 0.2 MG/ML IJ SOLN
INTRAMUSCULAR | Status: AC
  Filled 2013-02-13: qty 3

## 2013-02-13 MED ORDER — 0.9 % SODIUM CHLORIDE (POUR BTL) OPTIME
TOPICAL | Status: DC | PRN
  Administered 2013-02-13 (×2): 1000 mL

## 2013-02-13 MED ORDER — IBUPROFEN 600 MG PO TABS
600.0000 mg | ORAL_TABLET | Freq: Four times a day (QID) | ORAL | Status: DC | PRN
  Administered 2013-02-14 – 2013-02-15 (×3): 600 mg via ORAL
  Filled 2013-02-13 (×3): qty 1

## 2013-02-13 MED ORDER — PROPOFOL 10 MG/ML IV EMUL
INTRAVENOUS | Status: AC
  Filled 2013-02-13: qty 20

## 2013-02-13 MED ORDER — SCOPOLAMINE 1 MG/3DAYS TD PT72
MEDICATED_PATCH | TRANSDERMAL | Status: AC
  Administered 2013-02-13: 1.5 mg via TRANSDERMAL
  Filled 2013-02-13: qty 1

## 2013-02-13 MED ORDER — NEOSTIGMINE METHYLSULFATE 1 MG/ML IJ SOLN
INTRAMUSCULAR | Status: AC
  Filled 2013-02-13: qty 1

## 2013-02-13 MED ORDER — NALOXONE HCL 0.4 MG/ML IJ SOLN: 0.4000 mg | INTRAMUSCULAR | Status: DC | PRN

## 2013-02-13 MED ORDER — NEOSTIGMINE METHYLSULFATE 1 MG/ML IJ SOLN
INTRAMUSCULAR | Status: DC | PRN
  Administered 2013-02-13: 2 mg via INTRAVENOUS

## 2013-02-13 MED ORDER — PROPOFOL 10 MG/ML IV EMUL
INTRAVENOUS | Status: DC | PRN
  Administered 2013-02-13: 40 mg via INTRAVENOUS
  Administered 2013-02-13 (×2): 20 mg via INTRAVENOUS
  Administered 2013-02-13: 40 mg via INTRAVENOUS
  Administered 2013-02-13: 300 mg via INTRAVENOUS
  Administered 2013-02-13: 30 mg via INTRAVENOUS

## 2013-02-13 MED ORDER — FENTANYL CITRATE 0.05 MG/ML IJ SOLN
25.0000 ug | INTRAMUSCULAR | Status: DC | PRN
  Administered 2013-02-13: 50 ug via INTRAVENOUS

## 2013-02-13 MED ORDER — PROPOFOL 10 MG/ML IV EMUL
INTRAVENOUS | Status: AC
  Filled 2013-02-13: qty 40

## 2013-02-13 SURGICAL SUPPLY — 75 items
BAG URINE DRAINAGE (UROLOGICAL SUPPLIES) ×3 IMPLANT
BARRIER ADHS 3X4 INTERCEED (GAUZE/BANDAGES/DRESSINGS) ×3 IMPLANT
BLADE LAPAROSCOPIC MORCELL KIT (BLADE) IMPLANT
CATH FOLEY 3WAY  5CC 16FR (CATHETERS) ×1
CATH FOLEY 3WAY 5CC 16FR (CATHETERS) ×2 IMPLANT
CLOTH BEACON ORANGE TIMEOUT ST (SAFETY) ×3 IMPLANT
CONT PATH 16OZ SNAP LID 3702 (MISCELLANEOUS) ×3 IMPLANT
COVER MAYO STAND STRL (DRAPES) ×3 IMPLANT
COVER TABLE BACK 60X90 (DRAPES) ×6 IMPLANT
COVER TIP SHEARS 8 DVNC (MISCELLANEOUS) ×2 IMPLANT
COVER TIP SHEARS 8MM DA VINCI (MISCELLANEOUS) ×1
DECANTER SPIKE VIAL GLASS SM (MISCELLANEOUS) IMPLANT
DERMABOND ADVANCED (GAUZE/BANDAGES/DRESSINGS) ×1
DERMABOND ADVANCED .7 DNX12 (GAUZE/BANDAGES/DRESSINGS) ×2 IMPLANT
DRAPE HUG U DISPOSABLE (DRAPE) ×3 IMPLANT
DRAPE LG THREE QUARTER DISP (DRAPES) ×6 IMPLANT
DRAPE STERI URO 9X17 APER PCH (DRAPES) ×3 IMPLANT
DRAPE WARM FLUID 44X44 (DRAPE) ×3 IMPLANT
ELECT LIGASURE LONG (ELECTRODE) ×3 IMPLANT
ELECT REM PT RETURN 9FT ADLT (ELECTROSURGICAL) ×3
ELECTRODE REM PT RTRN 9FT ADLT (ELECTROSURGICAL) ×2 IMPLANT
EVACUATOR SMOKE 8.L (FILTER) ×3 IMPLANT
GAUZE VASELINE 3X9 (GAUZE/BANDAGES/DRESSINGS) IMPLANT
GLOVE BIO SURGEON STRL SZ 6.5 (GLOVE) ×3 IMPLANT
GLOVE BIO SURGEON STRL SZ7 (GLOVE) ×3 IMPLANT
GLOVE BIOGEL PI IND STRL 7.0 (GLOVE) ×2 IMPLANT
GLOVE BIOGEL PI INDICATOR 7.0 (GLOVE) ×1
GOWN STRL REIN XL XLG (GOWN DISPOSABLE) ×18 IMPLANT
IV STOPCOCK 4 WAY 40  W/Y SET (IV SOLUTION)
IV STOPCOCK 4 WAY 40 W/Y SET (IV SOLUTION) IMPLANT
KIT ACCESSORY DA VINCI DISP (KITS) ×1
KIT ACCESSORY DVNC DISP (KITS) ×2 IMPLANT
LEGGING LITHOTOMY PAIR STRL (DRAPES) ×3 IMPLANT
NEEDLE HYPO 22GX1.5 SAFETY (NEEDLE) ×3 IMPLANT
OCCLUDER COLPOPNEUMO (BALLOONS) ×3 IMPLANT
PACK LAVH (CUSTOM PROCEDURE TRAY) ×3 IMPLANT
PAD PREP 24X48 CUFFED NSTRL (MISCELLANEOUS) ×6 IMPLANT
PLUG CATH AND CAP STER (CATHETERS) ×3 IMPLANT
PROTECTOR NERVE ULNAR (MISCELLANEOUS) ×6 IMPLANT
RETAINER VISCERAL (MISCELLANEOUS) ×3 IMPLANT
SET CYSTO W/LG BORE CLAMP LF (SET/KITS/TRAYS/PACK) IMPLANT
SET IRRIG TUBING LAPAROSCOPIC (IRRIGATION / IRRIGATOR) ×3 IMPLANT
SOLUTION ELECTROLUBE (MISCELLANEOUS) ×3 IMPLANT
SPONGE LAP 18X18 X RAY DECT (DISPOSABLE) ×15 IMPLANT
STAPLER VISISTAT 35W (STAPLE) ×3 IMPLANT
SUT PLAIN 2 0 XLH (SUTURE) ×6 IMPLANT
SUT VIC AB 0 CT1 18XCR BRD8 (SUTURE) ×4 IMPLANT
SUT VIC AB 0 CT1 27 (SUTURE) ×2
SUT VIC AB 0 CT1 27XBRD ANTBC (SUTURE) IMPLANT
SUT VIC AB 0 CT1 27XCR 8 STRN (SUTURE) ×4 IMPLANT
SUT VIC AB 0 CT1 36 (SUTURE) ×3 IMPLANT
SUT VIC AB 0 CT1 8-18 (SUTURE) ×2
SUT VIC AB 4-0 PS2 18 (SUTURE) ×9 IMPLANT
SUT VIC AB 4-0 PS2 27 (SUTURE) ×6 IMPLANT
SUT VICRYL 0 27 CT2 27 ABS (SUTURE) IMPLANT
SUT VICRYL 0 TIES 12 18 (SUTURE) ×3 IMPLANT
SUT VICRYL 0 UR6 27IN ABS (SUTURE) ×6 IMPLANT
SUT VLOC 180 0 9IN  GS21 (SUTURE)
SUT VLOC 180 0 9IN GS21 (SUTURE) IMPLANT
SYR 50ML LL SCALE MARK (SYRINGE) ×3 IMPLANT
SYSTEM CONVERTIBLE TROCAR (TROCAR) ×3 IMPLANT
TIP RUMI ORANGE 6.7MMX12CM (TIP) IMPLANT
TIP UTERINE 5.1X6CM LAV DISP (MISCELLANEOUS) IMPLANT
TIP UTERINE 6.7X10CM GRN DISP (MISCELLANEOUS) IMPLANT
TIP UTERINE 6.7X6CM WHT DISP (MISCELLANEOUS) IMPLANT
TIP UTERINE 6.7X8CM BLUE DISP (MISCELLANEOUS) IMPLANT
TOWEL OR 17X24 6PK STRL BLUE (TOWEL DISPOSABLE) ×9 IMPLANT
TROCAR 12M 150ML BLUNT (TROCAR) ×3 IMPLANT
TROCAR DISP BLADELESS 8 DVNC (TROCAR) ×2 IMPLANT
TROCAR DISP BLADELESS 8MM (TROCAR) ×1
TROCAR XCEL 12X100 BLDLESS (ENDOMECHANICALS) IMPLANT
TROCAR XCEL NON-BLD 5MMX100MML (ENDOMECHANICALS) ×3 IMPLANT
TUBING FILTER THERMOFLATOR (ELECTROSURGICAL) ×3 IMPLANT
WARMER LAPAROSCOPE (MISCELLANEOUS) ×3 IMPLANT
WATER STERILE IRR 1000ML POUR (IV SOLUTION) ×9 IMPLANT

## 2013-02-13 NOTE — H&P (Signed)
Stacy Hill is an 42 y.o. female G2P1101  RP:  Large uterine myoma for [facility]  Pertinent Gynecological History: Menses: flow is excessive with use of many pads or tampons on heaviest days Contraception: oral progesterone-only contraceptive Blood transfusions: none Sexually transmitted diseases: no past history Last pap: normal  OB History: G2P1101  C/S  Menstrual History:  No LMP recorded.    Past Medical History  Diagnosis Date  . Fibroid   . Depression   . Seasonal allergies   . Shortness of breath     On exhertion,recently,with anemia per pt.  . Anemia   . Headache   . GERD (gastroesophageal reflux disease)     Takes Tums prn.  . Restless leg syndrome   . Anxiety     [facility] visit to [facility] 2008 gave birth to stillborn, pt teary today discussing    Past Surgical History  Procedure Laterality Date  . Cholecystectomy    . Cesarean section      Family History  Problem Relation Age of Onset  . Cancer Mother     Bladder cancer  . Hypertension Mother   . COPD Mother   . Hypertension Sister   . Heart attack Maternal Grandfather     Social History:  reports that she has never smoked. She has never used smokeless tobacco. She reports that  drinks alcohol. She reports that she does not use illicit drugs.  Allergies:  Allergies  Allergen Reactions  . Ivp Dye (Iodinated Diagnostic Agents) Hives  . Shellfish Allergy Hives    Prescriptions prior to admission  Medication Sig Dispense Refill  . FLUoxetine (PROZAC) 10 MG capsule Take 10 mg by mouth 3 (three) times daily.      . IRON PO Take 3 tablets by mouth daily.       . cetirizine (ZYRTEC) 10 MG tablet Take 20 mg by mouth daily.      . norethindrone (CAMILA) 0.35 MG tablet Take 1 tablet by mouth daily.         Blood pressure 122/82, pulse 97, temperature 98.1 F (36.7 C), temperature source Oral, resp. rate 20, SpO2 100.00%.  MRI 16 cm uterine myoma with benign features.  Both ovaries wnl.  Results  for orders placed during the hospital encounter of 02/13/13 (from the past 24 hour(s))  PREGNANCY, URINE     Status: None   Collection Time    02/13/13  5:50 AM      Result Value Range   Preg Test, Ur NEGATIVE  NEGATIVE    No results found.  Assessment/Plan: Large symptomatic uterine myoma with menorrhagia for [facility], morcellation.  16 cm myoma with benign appearance on MRI.  Surgery and risks reviewed.  Stacy Hill,Stacy Hill 02/13/2013, 7:15 AM

## 2013-02-13 NOTE — Op Note (Signed)
02/13/2013  11:10 AM  PATIENT:  Stacy Hill  42 y.o. female  PRE-OPERATIVE DIAGNOSIS:  Large Uterine Myoma, Menorrhagia, Anemia  [id number]  POST-OPERATIVE DIAGNOSIS:  Large uterine myoma,menorrhagia,severe omental adhesions, morbid obesity  PROCEDURE:  Procedure(s): DIAGNOSTIC LAPAROSCOPY, TOTAL ABDOMINAL HYSTERECTOMY WITH BILATERAL SALPINGECTOMY AND EXTENSIVE LYSIS OF ADHESIONS  SURGEON:  Surgeon(s): Genia Del, MD Lenoard Aden, MD  ASSISTANTS: Olivia Mackie MD   ANESTHESIA:   general  PROCEDURE:  Under general anesthesia with endotracheal intubation the patient is in lithotomy position. She is prepped with ChloraPrep on the abdomen and with Chloroxylenol PCMX on the suprapubic, vulvar and vaginal areas. She is draped as usual.  The Foley is inserted in the bladder.  The vaginal exam confirms a very large uterus, above the umbilicus, mobile. The weighted speculum was introduced in the vagina and the anterior lip of the cervix is grasped with a tenaculum. We dilate the cervix with Hegar dilators. The hysterometry is more than 12 cm therefore a #12 roomy is used with the medium Coe ring. Those aren't inserted and put in place easily. The other instruments are removed.  Abdominally we infiltrate the subcutaneous tissue with Marcaine one quarter plain at about 5 cm above the fundus of the uterus in the midline. We make a 2 cm incision at that level with the scalpel. We opened the aponeurosis with Mayo scissors under direct vision. We opened the parietal peritoneum with Mayo scissors under direct vision as well. A pursestring stitch of Vicryl 0 is done on the aponeurosis. The Roseanne Reno was introduced at that level and a pneumoperitoneum is created. The camera is then inserted.  We note extensive adhesions of the omentum with on the anterior aspect of the uterus all the way from the pelvis to the fundus.  The omentum is also adherent to the anterior abdominal wall in the midline and  laterally.  The uterus is barely moving with manipulation of the Coe ring.  The decision is therefore taken to proceed with a total abdominal hysterectomy.  The camera is therefore removed. The port is removed as well. The pursestring stitch is attached.  A subcuticular stitch of Vicryl 4-0 is done and Dermabond was added. The Coe ring is removed from the vagina. The patient is repositioned for laparotomy.        We make a Pfannenstiel incision with the scalpel at the site of the previous C-section incision.  The adipose tissue was opened with the scalpel and then the electrocautery.  We opened the aponeurosis transversely with the electrocautery and then completed on each side with Mayo scissors.  The parietal peritoneum was opened longitudinally with Metzenbaums scissors.  Extensive adhesions of the omentum with the anterior wall and with the uterus and with the lateral walls of the abdomen and pelvis were lysed with the electrocautery and Metzenbaums scissors.  We were then able to bring the uterus out at the skin incision.  We used the long LigaSure for most of the surgery.  We cauterized with ligature and section with Metzenbaums scissors the right round ligament. We cauterized and section the right utero-ovarian ligament.  We were then able to make a window at the level of the broad ligament on the right side and completed the cauterization and section of the right utero ovarian ligament.  We then cauterized and section the lower right mesosalpinx to remove the right tube.  The visceral anterior peritoneum was opened with the Metzenbaums scissors and the bladder was reclined downwards. Adhesions were  present at that level which were lysed. We proceeded exactly the same way on the left side. The bladder was reclined downwards further.  We then skeletonized the left uterine artery.  We cauterized it with LigaSure.  Were also cauterized on the uterine side for prevention of backflow. We proceeded the same way on  the right side. We then section the left uterine artery and the right uterine artery. We descended on each side of the uterus cauterizing with ligature, staying very close to the cervix.  We then sectioned the cervix just above that level with the scalpel.  The upper part of the cervix with the uterus and both tubes were removed and sent to pathology.  We grasped the cervix anteriorly and posteriorly with Coker clamps.  We then used straight Heaneys to clamp on either side of the uterus and section with the scalpel and sutured with Vicryl 0 in a Heaney stitch.  We then clamped the uterosacral ligaments on the left side sectioned with Mayo scissors and sutured with Vicryl 0 in a Heaney stitch. We we did the same thing on the right side. Those were kept on a  Hemostat. We then reached the the angle of the vagina. We Used a Heaney clamp on both sides.  We Sectioned with mayo scissors and sutured with Vicryl 0 in a Heaney stitch.  Those were a Hemostat as well. We closed the middle of the vaginal vault with a Vicryl 0 in a figure-of-eight stitch.  Were irrigated and suctioned the abdominopelvic cavities. We verified all pedicles and completed where necessary with the electrocautery.  We then closed the aponeurosis with a running suture of Vicryl 0 to close back an extension we had to do in the midline vertically at the lower aponeurosis to increase exposure.  We then closed the aponeurosis transversely with 2 half running suture of Vicryl 0.  The adipose tissue was closed with a running suture of plain 2-0. We reapproximated the skin with staples. A dry dressing was applied.      Dr. Brayton Caves performed a transverse abdominal block under ultrasound guidance.      The patient was brought to recovery room in good and stable status.  ESTIMATED BLOOD LOSS:  600 cc   Intake/Output Summary (Last 24 hours) at 02/13/13 1110 Last data filed at 02/13/13 1028  Gross per 24 hour  Intake   3000 ml  Output    800 ml   Net   2200 ml     BLOOD ADMINISTERED:none   LOCAL MEDICATIONS USED:  MARCAINE     SPECIMEN:  Source of Specimen:  Uterus with large IM myoma, tubes and cervix  DISPOSITION OF SPECIMEN:  PATHOLOGY  COUNTS:  YES  PLAN OF CARE: Transfer to PACU  Genia Del  MD  02/13/2013 at 11:10 am

## 2013-02-13 NOTE — Transfer of Care (Signed)
Immediate Anesthesia Transfer of Care Note  Patient: Stacy Hill  Procedure(s) Performed: Procedure(s): HYSTERECTOMY ABDOMINAL WITH BILATERAL SALPINGECTOMY WITH EXTENSIVE LYSIS OF ADHESIONS (N/A)  Patient Location: PACU  Anesthesia Type:General  Level of Consciousness: awake, alert  and oriented  Airway & Oxygen Therapy: Patient Spontanous Breathing and Patient connected to nasal cannula oxygen  Post-op Assessment: Report given to PACU RN and Post -op Vital signs reviewed and stable  Post vital signs: Reviewed and stable  Complications: No apparent anesthesia complications

## 2013-02-13 NOTE — Anesthesia Procedure Notes (Addendum)
Procedure Name: Intubation Date/Time: 02/13/2013 7:31 AM Performed by: Shanon Payor Pre-anesthesia Checklist: Suction available, Emergency Drugs available, Timeout performed, Patient identified and Patient being monitored Patient Re-evaluated:Patient Re-evaluated prior to inductionOxygen Delivery Method: Circle system utilized Preoxygenation: Pre-oxygenation with 100% oxygen Intubation Type: IV induction Ventilation: Mask ventilation without difficulty Laryngoscope Size: Mac and 3 Grade View: Grade I Tube type: Oral Tube size: 7.0 mm Number of attempts: 1 Placement Confirmation: ETT inserted through vocal cords under direct vision,  positive ETCO2 and breath sounds checked- equal and bilateral Secured at: 22 cm Tube secured with: Tape Dental Injury: Teeth and Oropharynx as per pre-operative assessment  Comments: Patient positioned on several blankets for adequate sniffing position. Preoxygenated for 5 minutes. Atraumatic intubation.    Anesthesia Regional Block:  TAP block  Pre-Anesthetic Checklist: ,, timeout performed, Correct Patient, Correct Site, Correct Laterality, Correct Procedure, Correct Position, site marked, Risks and benefits discussed,  Surgical consent,  Pre-op evaluation,  At surgeon's request and post-op pain management   Prep: chloraprep       Needles:  Injection technique: Single-shot  Needle Type: Stimiplex     Needle Length: 10cm 10 cm     Additional Needles:  Procedures: ultrasound guided (picture in chart) TAP block Narrative:  Start time: 02/13/2013 10:40 AM End time: 02/13/2013 10:58 AM Injection made incrementally with aspirations every 5 mL.  Performed by: Personally   Additional Notes: Rescue block at surgeons request.  Patient tolerated procedure well, without complications.  Supraclavicular block

## 2013-02-13 NOTE — Anesthesia Postprocedure Evaluation (Signed)
  Anesthesia Post Note  Patient: Stacy Hill  Procedure(s) Performed: Procedure(s) (LRB): HYSTERECTOMY ABDOMINAL WITH BILATERAL SALPINGECTOMY WITH EXTENSIVE LYSIS OF ADHESIONS (N/A)  Anesthesia type: GA  Patient location: PACU  Post pain: Pain level controlled  Post assessment: Post-op Vital signs reviewed  Last Vitals:  Filed Vitals:   02/13/13 1245  BP: 120/65  Pulse: 95  Temp:   Resp: 10    Post vital signs: Reviewed  Level of consciousness: sedated  Complications: No apparent anesthesia complications

## 2013-02-14 ENCOUNTER — Encounter (HOSPITAL_COMMUNITY): Payer: Self-pay | Admitting: Obstetrics & Gynecology

## 2013-02-14 DIAGNOSIS — Z90710 Acquired absence of both cervix and uterus: Secondary | ICD-10-CM | POA: Diagnosis not present

## 2013-02-14 LAB — CBC
HCT: 29.2 % — ABNORMAL LOW (ref 36.0–46.0)
Hemoglobin: 9.5 g/dL — ABNORMAL LOW (ref 12.0–15.0)
MCHC: 32.5 g/dL (ref 30.0–36.0)
RDW: 17.6 % — ABNORMAL HIGH (ref 11.5–15.5)
WBC: 7.6 10*3/uL (ref 4.0–10.5)

## 2013-02-14 MED ORDER — DIPHENHYDRAMINE HCL 25 MG PO CAPS
25.0000 mg | ORAL_CAPSULE | Freq: Four times a day (QID) | ORAL | Status: DC | PRN
  Administered 2013-02-15: 25 mg via ORAL
  Filled 2013-02-14: qty 1

## 2013-02-14 MED ORDER — DIPHENHYDRAMINE HCL 25 MG PO CAPS
50.0000 mg | ORAL_CAPSULE | Freq: Once | ORAL | Status: AC
  Administered 2013-02-14: 50 mg via ORAL
  Filled 2013-02-14: qty 2

## 2013-02-14 NOTE — Progress Notes (Signed)
1 Day Post-Op Procedure(s) (LRB): HYSTERECTOMY ABDOMINAL WITH BILATERAL SALPINGECTOMY WITH EXTENSIVE LYSIS OF ADHESIONS (N/A)  Subjective: Patient reports that pain is well managed.  Tolerating normal diet as tolerated  diet without difficulty. No nausea / vomiting.  Ambulating and voiding.  Objective: BP 123/70  Pulse 83  Temp(Src) 97.8 F (36.6 C) (Oral)  Resp 18  Ht 5\' 3"  (1.6 m)  Wt 142.429 kg (314 lb)  BMI 55.64 kg/m2  SpO2 100% Lungs: clear Heart: normal rate and rhythm Abdomen:soft and appropriately tender Extremities: Homans sign is negative, no sign of DVT Incision: healing well  Hb postop 9.5 (from 10.7)  Assessment: s/p Procedure(s): HYSTERECTOMY ABDOMINAL WITH BILATERAL SALPINGECTOMY WITH EXTENSIVE LYSIS OF ADHESIONS: progressing well  Plan: Advance diet Encourage ambulation Discontinue IV fluids D/C foley Will d/c Prophylaxis Heparin tomorrow if ambulating well.  LOS: 1 day    Draylon Mercadel,MARIE-LYNE 02/14/2013, 11:02 AM

## 2013-02-15 MED ORDER — OXYCODONE-ACETAMINOPHEN 7.5-325 MG PO TABS: 1.0000 | ORAL_TABLET | ORAL | Status: DC | PRN

## 2013-02-15 NOTE — Plan of Care (Signed)
Problem: Discharge Progression Outcomes Goal: Discontinue staples (if applicable) Outcome: Not Applicable Date Met:  02/15/13 Staples to be removed in MD office 02-19-13

## 2013-02-15 NOTE — Discharge Summary (Signed)
  Physician Discharge Summary  Patient ID: Stacy Hill MRN: [id number] DOB/AGE: [birth year] 40 y.o.  Admit date: 02/13/2013 Discharge date: 02/15/2013  Admission Diagnoses: Large Uterine Myoma, Menorrhagia, Anemia  [address]  Discharge Diagnoses: Large Uterine Myoma, Menorrhagia, Anemia  78469, Adhesions        Active Problems:   S/P hysterectomy   Discharged Condition: good  Hospital Course: Good  Consults: None  Treatments: surgery: Dx LPS, TAH, lysis of adhesions  Disposition:      Medication List    STOP taking these medications       CAMILA 0.35 MG tablet  Generic drug:  norethindrone      TAKE these medications       cetirizine 10 MG tablet  Commonly known as:  ZYRTEC  Take 20 mg by mouth daily.     FLUoxetine 10 MG capsule  Commonly known as:  PROZAC  Take 10 mg by mouth 3 (three) times daily.     IRON PO  Take 3 tablets by mouth daily.     oxyCODONE-acetaminophen 7.5-325 MG per tablet  Commonly known as:  PERCOCET  Take 1 tablet by mouth every 4 (four) hours as needed for pain.           Follow-up Information   Follow up with Jeriyah Granlund,MARIE-LYNE, MD In 3 weeks. (F/U to remove staple 02/19/13 at [facility] office.)    Contact information:   [address] [place] [address] [phone]       Signed: Genia Del, MD 02/15/2013, 1:36 PM

## 2013-02-15 NOTE — Progress Notes (Signed)
2 Days Post-Op Procedure(s) (LRB): HYSTERECTOMY ABDOMINAL WITH BILATERAL SALPINGECTOMY WITH EXTENSIVE LYSIS OF ADHESIONS (N/A)  Subjective: Patient reports that pain is well managed.  Tolerating normal diet as tolerated  diet without difficulty. No nausea / vomiting.  Ambulating and voiding.  Objective: BP 125/62  Pulse 89  Temp(Src) 98 F (36.7 C) (Oral)  Resp 20  Ht 5\' 3"  (1.6 m)  Wt 142.429 kg (314 lb)  BMI 55.64 kg/m2  SpO2 100% Lungs: clear Heart: normal rate and rhythm Abdomen:soft and appropriately tender Extremities: Homans sign is negative, no sign of DVT Incision: healing well  Assessment: s/p Procedure(s): HYSTERECTOMY ABDOMINAL WITH BILATERAL SALPINGECTOMY WITH EXTENSIVE LYSIS OF ADHESIONS: progressing well  Plan: Discharge home  LOS: 2 days    Christiann Hagerty,MARIE-LYNE 02/15/2013, 1:25 PM

## 2013-02-15 NOTE — Progress Notes (Addendum)
Pt discharged to home with adult niece.  Condition stable.  Pt ambulated to car with E. Pinion, NT.  No equipment for home ordered at discharge.  Pt walking with cane from home per her choice - states her mother gave it to her for post-op use.  States she does not use cane normally.

## 2013-03-13 ENCOUNTER — Other Ambulatory Visit: Payer: Self-pay | Admitting: Obstetrics & Gynecology

## 2013-03-13 DIAGNOSIS — R1900 Intra-abdominal and pelvic swelling, mass and lump, unspecified site: Secondary | ICD-10-CM

## 2013-03-14 ENCOUNTER — Ambulatory Visit
Admission: RE | Admit: 2013-03-14 | Discharge: 2013-03-14 | Disposition: A | Discharge status: Home / Self Care | Payer: BC Managed Care – PPO | Source: Ambulatory Visit | Attending: Obstetrics & Gynecology | Admitting: Obstetrics & Gynecology

## 2013-03-14 DIAGNOSIS — R1900 Intra-abdominal and pelvic swelling, mass and lump, unspecified site: Secondary | ICD-10-CM

## 2013-03-14 MED ORDER — IOHEXOL 300 MG/ML  SOLN
125.0000 mL | Freq: Once | INTRAMUSCULAR | Status: AC | PRN
  Administered 2013-03-14: 125 mL via INTRAVENOUS

## 2013-03-15 ENCOUNTER — Other Ambulatory Visit (HOSPITAL_COMMUNITY): Payer: Self-pay | Admitting: Obstetrics & Gynecology

## 2013-03-15 DIAGNOSIS — R1900 Intra-abdominal and pelvic swelling, mass and lump, unspecified site: Secondary | ICD-10-CM

## 2013-03-16 ENCOUNTER — Emergency Department (HOSPITAL_COMMUNITY)
Admission: EM | Admit: 2013-03-16 | Discharge: 2013-03-17 | Disposition: A | Discharge status: Home / Self Care | Payer: BC Managed Care – PPO | Attending: Emergency Medicine | Admitting: Emergency Medicine

## 2013-03-16 ENCOUNTER — Encounter (HOSPITAL_COMMUNITY): Payer: Self-pay | Admitting: *Deleted

## 2013-03-16 ENCOUNTER — Emergency Department (HOSPITAL_COMMUNITY): Payer: BC Managed Care – PPO

## 2013-03-16 DIAGNOSIS — Y839 Surgical procedure, unspecified as the cause of abnormal reaction of the patient, or of later complication, without mention of misadventure at the time of the procedure: Secondary | ICD-10-CM | POA: Insufficient documentation

## 2013-03-16 DIAGNOSIS — R109 Unspecified abdominal pain: Secondary | ICD-10-CM | POA: Insufficient documentation

## 2013-03-16 DIAGNOSIS — IMO0002 Reserved for concepts with insufficient information to code with codable children: Secondary | ICD-10-CM | POA: Insufficient documentation

## 2013-03-16 LAB — COMPREHENSIVE METABOLIC PANEL
ALT: 15 U/L (ref 0–35)
AST: 15 U/L (ref 0–37)
Alkaline Phosphatase: 86 U/L (ref 39–117)
CO2: 25 mEq/L (ref 19–32)
Calcium: 9 mg/dL (ref 8.4–10.5)
Chloride: 100 mEq/L (ref 96–112)
GFR calc Af Amer: >90 mL/min (ref >90)
GFR calc non Af Amer: >90 mL/min (ref >90)
Glucose, Bld: 78 mg/dL (ref 70–99)
Potassium: 3.6 mEq/L (ref 3.5–5.1)
Sodium: 136 mEq/L (ref 135–145)
Total Bilirubin: 0.1 mg/dL — ABNORMAL LOW (ref 0.3–1.2)

## 2013-03-16 LAB — CBC WITH DIFFERENTIAL/PLATELET
Basophils Absolute: 0 10*3/uL (ref 0.0–0.1)
Lymphocytes Relative: 40 % (ref 12–46)
Lymphs Abs: 3.3 10*3/uL (ref 0.7–4.0)
MCV: 83 fL (ref 78.0–100.0)
Neutro Abs: 4.2 10*3/uL (ref 1.7–7.7)
Neutrophils Relative %: 51 % (ref 43–77)
Platelets: 319 10*3/uL (ref 150–400)
RBC: 3.65 MIL/uL — ABNORMAL LOW (ref 3.87–5.11)
RDW: 14.5 % (ref 11.5–15.5)
WBC: 8.3 10*3/uL (ref 4.0–10.5)

## 2013-03-16 LAB — URINALYSIS, ROUTINE W REFLEX MICROSCOPIC
Hgb urine dipstick: NEGATIVE
Leukocytes, UA: NEGATIVE
Protein, ur: NEGATIVE mg/dL
Specific Gravity, Urine: 1.025 (ref 1.005–1.030)
Urobilinogen, UA: 0.2 mg/dL (ref 0.0–1.0)

## 2013-03-16 MED ORDER — HYDROMORPHONE HCL PF 1 MG/ML IJ SOLN
1.0000 mg | Freq: Once | INTRAMUSCULAR | Status: AC
  Administered 2013-03-16: 1 mg via INTRAVENOUS
  Filled 2013-03-16: qty 1

## 2013-03-16 NOTE — ED Notes (Signed)
The pt is here for abd pain that she had had for 2 weeks.  She had a mri  Thursday. She is due to have fluid in her abd drained Tuesday by general surgery.  She is having pain

## 2013-03-16 NOTE — ED Notes (Signed)
Pt. Completed po contrast. CT made aware.

## 2013-03-17 MED ORDER — OXYCODONE-ACETAMINOPHEN 5-325 MG PO TABS: 1.0000 | ORAL_TABLET | ORAL | Status: DC | PRN

## 2013-03-17 NOTE — ED Provider Notes (Signed)
History     CSN: [id number]  Arrival date & time 03/16/13  1758   First MD Initiated Contact with Patient 03/16/13 1814      Chief Complaint  Patient presents with  . Abdominal Pain    (Consider location/radiation/quality/duration/timing/severity/associated sxs/prior treatment) Patient is a 42 y.o. female presenting with abdominal pain. The history is provided by the patient and a relative. No language interpreter was used.  Abdominal Pain The current episode started 1 to 4 weeks ago. Associated symptoms include abdominal pain. Pertinent negatives include no chest pain, coughing, fever, myalgias, nausea, rash, vomiting or weakness. Associated symptoms comments: She reports recent hysterectomy by Dr. Seymour Bars at the end of April. For the past 2 weeks she has had increasing lower and periumbilical abdominal pain and in follow up with Dr. Seymour Bars a CT scan was done that showed a seroma that was likely post-surgical. She is scheduled to have it drained next week but presents tonight with increased size of the area that is painful and increased pain that makes it difficult to stand or move. She reports normal bowel habits, normal urination. No fever. She has not had nausea or vomiting. .    Past Medical History  Diagnosis Date  . Fibroid   . Depression   . Seasonal allergies   . Shortness of breath     On exhertion,recently,with anemia per pt.  . Anemia   . Headache(784.0)   . GERD (gastroesophageal reflux disease)     Takes Tums prn.  . Restless leg syndrome   . Anxiety     Last visit to [facility] 2008 gave birth to stillborn, pt teary today discussing    Past Surgical History  Procedure Laterality Date  . Cholecystectomy    . Cesarean section    . Abdominal hysterectomy N/A 02/13/2013    Procedure: HYSTERECTOMY ABDOMINAL WITH BILATERAL SALPINGECTOMY WITH EXTENSIVE LYSIS OF ADHESIONS;  Surgeon: Genia Del, MD;  Location: [facility];  Service: Gynecology;  Laterality: N/A;    Family  History  Problem Relation Age of Onset  . Cancer Mother     Bladder cancer  . Hypertension Mother   . COPD Mother   . Hypertension Sister   . Heart attack Maternal Grandfather     History  Substance Use Topics  . Smoking status: Never Smoker   . Smokeless tobacco: Never Used  . Alcohol Use: Yes     Comment: rare    OB History   Grav Para Term Preterm Abortions TAB SAB Ect Mult Living   2 2 1 1      1       Review of Systems  Constitutional: Negative for fever.  Respiratory: Negative for cough and shortness of breath.   Cardiovascular: Negative for chest pain.  Gastrointestinal: Positive for abdominal pain. Negative for nausea and vomiting.  Genitourinary: Negative for dysuria and vaginal bleeding.  Musculoskeletal: Negative for myalgias.  Skin: Negative for rash and wound.  Neurological: Negative for dizziness, weakness and light-headedness.    Allergies  Ivp dye and Shellfish allergy  Home Medications   Current Outpatient Rx  Name  Route  Sig  Dispense  Refill  . cetirizine (ZYRTEC) 10 MG tablet   Oral   Take 20 mg by mouth daily.         . diphenhydrAMINE (SOMINEX) 25 MG tablet   Oral   Take 25 mg by mouth at bedtime as needed for allergies or sleep.         Marland Kitchen  FLUoxetine (PROZAC) 10 MG capsule   Oral   Take 10 mg by mouth 3 (three) times daily.         Marland Kitchen ibuprofen (ADVIL,MOTRIN) 200 MG tablet   Oral   Take 200 mg by mouth every 6 (six) hours as needed for pain.         . IRON PO   Oral   Take 3 tablets by mouth daily.          . Multiple Vitamin (MULTIVITAMIN WITH MINERALS) TABS   Oral   Take 1 tablet by mouth daily.           BP 106/57  Pulse 72  Temp(Src) 98.4 F (36.9 C) (Oral)  Resp 20  SpO2 96%  LMP 01/10/2013  Physical Exam  Constitutional: She is oriented to person, place, and time. She appears well-developed and well-nourished. No distress.  HENT:  Head: Normocephalic.  Neck: Normal range of motion.   Pulmonary/Chest: Effort normal. She has no wheezes. She has no rales.  Abdominal: Soft. There is tenderness. There is guarding.  Obese abdomen with large pannus that is tender left of midline with mild induration. No redness or warmth. No ulceration or wound.   Neurological: She is alert and oriented to person, place, and time.  Skin: Skin is warm and dry.  Psychiatric: She has a normal mood and affect.    ED Course  Procedures (including critical care time)  Labs Reviewed  CBC WITH DIFFERENTIAL - Abnormal; Notable for the following:    RBC 3.65 (*)    Hemoglobin 10.1 (*)    HCT 30.3 (*)    All other components within normal limits  COMPREHENSIVE METABOLIC PANEL - Abnormal; Notable for the following:    Creatinine, Ser 0.46 (*)    Albumin 3.0 (*)    Total Bilirubin 0.1 (*)    All other components within normal limits  URINALYSIS, ROUTINE W REFLEX MICROSCOPIC  LIPASE, BLOOD   Results for orders placed during the hospital encounter of 03/16/13  URINALYSIS, ROUTINE W REFLEX MICROSCOPIC      Result Value Range   Color, Urine YELLOW  YELLOW   APPearance CLEAR  CLEAR   Specific Gravity, Urine 1.025  1.005 - 1.030   pH 6.0  5.0 - 8.0   Glucose, UA NEGATIVE  NEGATIVE mg/dL   Hgb urine dipstick NEGATIVE  NEGATIVE   Bilirubin Urine NEGATIVE  NEGATIVE   Ketones, ur NEGATIVE  NEGATIVE mg/dL   Protein, ur NEGATIVE  NEGATIVE mg/dL   Urobilinogen, UA 0.2  0.0 - 1.0 mg/dL   Nitrite NEGATIVE  NEGATIVE   Leukocytes, UA NEGATIVE  NEGATIVE  CBC WITH DIFFERENTIAL      Result Value Range   WBC 8.3  4.0 - 10.5 K/uL   RBC 3.65 (*) 3.87 - 5.11 MIL/uL   Hemoglobin 10.1 (*) 12.0 - 15.0 g/dL   HCT 45.4 (*) 09.8 - 11.9 %   MCV 83.0  78.0 - 100.0 fL   MCH 27.7  26.0 - 34.0 pg   MCHC 33.3  30.0 - 36.0 g/dL   RDW 14.7  82.9 - 56.2 %   Platelets 319  150 - 400 K/uL   Neutrophils Relative % 51  43 - 77 %   Neutro Abs 4.2  1.7 - 7.7 K/uL   Lymphocytes Relative 40  12 - 46 %   Lymphs Abs 3.3  0.7  - 4.0 K/uL   Monocytes Relative 5  3 - 12 %  Monocytes Absolute 0.4  0.1 - 1.0 K/uL   Eosinophils Relative 4  0 - 5 %   Eosinophils Absolute 0.3  0.0 - 0.7 K/uL   Basophils Relative 0  0 - 1 %   Basophils Absolute 0.0  0.0 - 0.1 K/uL  COMPREHENSIVE METABOLIC PANEL      Result Value Range   Sodium 136  135 - 145 mEq/L   Potassium 3.6  3.5 - 5.1 mEq/L   Chloride 100  96 - 112 mEq/L   CO2 25  19 - 32 mEq/L   Glucose, Bld 78  70 - 99 mg/dL   BUN 10  6 - 23 mg/dL   Creatinine, Ser 1.61 (*) 0.50 - 1.10 mg/dL   Calcium 9.0  8.4 - 09.6 mg/dL   Total Protein 7.1  6.0 - 8.3 g/dL   Albumin 3.0 (*) 3.5 - 5.2 g/dL   AST 15  0 - 37 U/L   ALT 15  0 - 35 U/L   Alkaline Phosphatase 86  39 - 117 U/L   Total Bilirubin 0.1 (*) 0.3 - 1.2 mg/dL   GFR calc non Af Amer >90  >90 mL/min   GFR calc Af Amer >90  >90 mL/min  LIPASE, BLOOD      Result Value Range   Lipase 17  11 - 59 U/L  Ct Abdomen Pelvis Wo Contrast  03/17/2013   *RADIOLOGY REPORT*  Clinical Data: Abdominal pain  CT ABDOMEN AND PELVIS WITHOUT CONTRAST  Technique:  Multidetector CT imaging of the abdomen and pelvis was performed following the standard protocol without intravenous contrast.  Comparison: 01/24/1939 MRI, 02/23/1939 CT  Findings: Limited images through the lung bases demonstrate no significant appreciable abnormality. The heart size is within normal limits. No pleural or pericardial effusion.  Organ abnormality/lesion detection is limited in the absence of intravenous contrast. Within this limitation, unremarkable liver, spleen, pancreas, adrenal glands.  Absent gallbladder.  No biliary ductal dilatation.  Symmetric renal size.  No hydronephrosis or hydroureter.  No CT evidence for colitis.  Normal appendix.  Small bowel loops are normal course and caliber.  Lobular mass arising off the posterolateral margin of the stomach, measuring approximately 6.6 x 5.6 cm.  No free intraperitoneal air.  Reactive size inguinal lymph nodes.  Normal  caliber aorta and branch vessels.  Streak artifact from patient contact with the gantry obscures detailed evaluation of the pelvis.  Mild stranding of the anterior peritoneal fat, in keeping with the recent hysterectomy.  Partially decompressed bladder. 4.7 x 2.9 cm soft tissue attenuation along the left adnexa, abutting the external iliac vasculature.  A loop of bowel herniates between the rectus abdominus musculature. Anterior subcutaneous fluid collection measuring 7.1 x 12.6 cm, slightly higher than that of water density.  Multilevel degenerative changes without acute osseous finding.  IMPRESSION: Anterior subcutaneous fluid collection is similar to recent prior at 7.1 x 12.6 cm.  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  Recommend surgical consultation.  Postoperative changes of recent hysterectomy with a small amount of blood/stranding within the pelvis.  4.7 x 2.9 cm density along the left adnexa/external iliac vasculature may reflect the ovary. A pelvic ultrasound could better characterize.   Original Report Authenticated By: Jearld Lesch, M.D.   Ct Abdomen Pelvis W Contrast  03/14/2013   *RADIOLOGY REPORT*  Clinical Data: Enlarging left sided abdominal mass, prior hysterectomy  CT ABDOMEN AND PELVIS WITH CONTRAST  Technique:  Multidetector CT imaging of the abdomen and pelvis was performed following the standard protocol during bolus administration of intravenous contrast.  Contrast: OMNIPAQUE IOHEXOL 300 MG/ML  SOLN  Comparison: CT abdomen pelvis of 06/27/2007 and MRI pelvis of 01/23/2013  Findings: The lung bases are clear.  The liver enhances with no focal abnormality and no ductal dilatation is seen.  Surgical clips are noted from prior cholecystectomy.  The pancreas is normal in size and the pancreatic duct is not dilated.  The adrenal glands and spleen are unremarkable.  The stomach is moderately fluid distended.  Immediately adjacent to the greater curvature of the stomach near the gastric fundus there is a lobular soft tissue mass present of approximately 7.7 x 5.4 x 4.8 cm, not seen on the prior unenhanced study from 2008. This lesion has an attenuation of 56 HU.  This most likely represents a gastrointestinal stromal tumor, (G I S T tumor) being associated with the gastric wall.  The more distal stomach is unremarkable. The kidneys enhance with no calculi and no hydronephrosis is seen.  The abdominal aorta is normal in caliber.  No adenopathy is noted.  Slightly to the left of midline and below the umbilicus there is an oval subcutaneous collection present with an attenuation of 30 HU, measuring 6.6 by 13.4 x 7.8 cm most consistent with postoperative seroma.  Clinical correlation is recommended.  The uterus has been resected.  No adnexal lesion is seen.  No fluid is seen within the pelvis.  Postoperative strandiness is noted anteriorly within the pelvis.  The urinary bladder is not well distended.  No abnormality of the colon is seen.  The terminal ileum and appendix are unremarkable.  No acute bony abnormality is seen.  IMPRESSION:  1.  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 2.  Oval fluid collection just to the left midline below the umbilicus most consistent with postoperative seroma of 6.6 x 13.4 x 7.8 cm.   Original Report Authenticated By: Dwyane Dee, M.D.    No results found.   No diagnosis found.  1. Abdominal pain  MDM  Lab studies are normal. Discussed discharge home with patient and family and patient is concerned about the growing size and increased pain involved with her diagnosis. Discussed CT with radiology who advised PO CM only would be acceptable as she requires allergic prep for IVP dye allergy. Pain is currently controlled.   CT abdomen confirms little interim change  in seroma. No other acute process. Stable for discharge.         Arnoldo Hooker, PA-C 03/17/13 0107

## 2013-03-17 NOTE — ED Provider Notes (Signed)
Medical screening examination/treatment/procedure(s) were performed by non-physician practitioner and as supervising physician I was immediately available for consultation/collaboration. Devoria Albe, MD, Armando Gang   Ward Givens, MD 03/17/13 [phone]

## 2013-03-18 ENCOUNTER — Encounter (HOSPITAL_COMMUNITY): Payer: Self-pay | Admitting: Pharmacy Technician

## 2013-03-18 ENCOUNTER — Other Ambulatory Visit: Payer: Self-pay | Admitting: Radiology

## 2013-03-19 ENCOUNTER — Ambulatory Visit (HOSPITAL_COMMUNITY)
Admission: RE | Admit: 2013-03-19 | Discharge: 2013-03-19 | Disposition: A | Discharge status: Home / Self Care | Payer: BC Managed Care – PPO | Source: Ambulatory Visit | Attending: Obstetrics & Gynecology | Admitting: Obstetrics & Gynecology

## 2013-03-19 ENCOUNTER — Encounter (HOSPITAL_COMMUNITY): Payer: Self-pay

## 2013-03-19 ENCOUNTER — Ambulatory Visit (INDEPENDENT_AMBULATORY_CARE_PROVIDER_SITE_OTHER): Payer: BC Managed Care – PPO | Admitting: General Surgery

## 2013-03-19 ENCOUNTER — Encounter (INDEPENDENT_AMBULATORY_CARE_PROVIDER_SITE_OTHER): Payer: Self-pay | Admitting: General Surgery

## 2013-03-19 VITALS — BP 118/80 | HR 98 | Temp 98.9°F | Resp 18 | Ht 63.0 in | Wt 322.0 lb

## 2013-03-19 DIAGNOSIS — K319 Disease of stomach and duodenum, unspecified: Secondary | ICD-10-CM

## 2013-03-19 DIAGNOSIS — R1900 Intra-abdominal and pelvic swelling, mass and lump, unspecified site: Secondary | ICD-10-CM

## 2013-03-19 DIAGNOSIS — IMO0002 Reserved for concepts with insufficient information to code with codable children: Secondary | ICD-10-CM | POA: Insufficient documentation

## 2013-03-19 DIAGNOSIS — K3189 Other diseases of stomach and duodenum: Secondary | ICD-10-CM

## 2013-03-19 DIAGNOSIS — Y836 Removal of other organ (partial) (total) as the cause of abnormal reaction of the patient, or of later complication, without mention of misadventure at the time of the procedure: Secondary | ICD-10-CM | POA: Insufficient documentation

## 2013-03-19 LAB — CBC
Platelets: 328 10*3/uL (ref 150–400)
RDW: 14.1 % (ref 11.5–15.5)
WBC: 7.6 10*3/uL (ref 4.0–10.5)

## 2013-03-19 LAB — PROTIME-INR
INR: 1.03 (ref 0.00–1.49)
Prothrombin Time: 13.4 seconds (ref 11.6–15.2)

## 2013-03-19 LAB — APTT: aPTT: 32 seconds (ref 24–37)

## 2013-03-19 MED ORDER — MIDAZOLAM HCL 2 MG/2ML IJ SOLN
INTRAMUSCULAR | Status: AC | PRN
  Administered 2013-03-19 (×2): 1 mg via INTRAVENOUS

## 2013-03-19 MED ORDER — FENTANYL CITRATE 0.05 MG/ML IJ SOLN
INTRAMUSCULAR | Status: AC | PRN
  Administered 2013-03-19 (×2): 50 ug via INTRAVENOUS

## 2013-03-19 MED ORDER — FENTANYL CITRATE 0.05 MG/ML IJ SOLN
INTRAMUSCULAR | Status: AC
  Filled 2013-03-19: qty 4

## 2013-03-19 MED ORDER — SODIUM CHLORIDE 0.9 % IV SOLN
Freq: Once | INTRAVENOUS | Status: AC
  Administered 2013-03-19: 08:00:00 via INTRAVENOUS

## 2013-03-19 MED ORDER — MIDAZOLAM HCL 2 MG/2ML IJ SOLN
INTRAMUSCULAR | Status: AC
  Filled 2013-03-19: qty 4

## 2013-03-19 NOTE — H&P (Signed)
Stacy Hill is an 42 y.o. female.   Chief Complaint: Abd pain x 2-3 weeks Hysterectomy 02/13/13 Developed N/V abd pain 1 week post surgery CT reveals LLQ seroma Scheduled now for seroma aspiration Pt also has abd mass near stomach- to see general surgeon late today HPI: Uterine fibroid; RLS; GERD  Past Medical History  Diagnosis Date  . Fibroid   . Depression   . Seasonal allergies   . Shortness of breath     On exhertion,recently,with anemia per pt.  . Anemia   . Headache(784.0)   . GERD (gastroesophageal reflux disease)     Takes Tums prn.  . Restless leg syndrome   . Anxiety     Last visit to [facility] 2008 gave birth to stillborn, pt teary today discussing    Past Surgical History  Procedure Laterality Date  . Cholecystectomy    . Cesarean section    . Abdominal hysterectomy N/A 02/13/2013    Procedure: HYSTERECTOMY ABDOMINAL WITH BILATERAL SALPINGECTOMY WITH EXTENSIVE LYSIS OF ADHESIONS;  Surgeon: Genia Del, MD;  Location: [facility];  Service: Gynecology;  Laterality: N/A;    Family History  Problem Relation Age of Onset  . Cancer Mother     Bladder cancer  . Hypertension Mother   . COPD Mother   . Hypertension Sister   . Heart attack Maternal Grandfather    Social History:  reports that she has never smoked. She has never used smokeless tobacco. She reports that  drinks alcohol. She reports that she does not use illicit drugs.  Allergies:  Allergies  Allergen Reactions  . Ivp Dye (Iodinated Diagnostic Agents) Hives  . Shellfish Allergy Hives     (Not in a hospital admission)  Results for orders placed during the hospital encounter of 03/19/13 (from the past 48 hour(s))  CBC     Status: Abnormal   Collection Time    03/19/13  8:00 AM      Result Value Range   WBC 7.6  4.0 - 10.5 K/uL   RBC 3.65 (*) 3.87 - 5.11 MIL/uL   Hemoglobin 10.1 (*) 12.0 - 15.0 g/dL   HCT 30.8 (*) 65.7 - 84.6 %   MCV 83.3  78.0 - 100.0 fL   MCH 27.7  26.0 - 34.0 pg   MCHC  33.2  30.0 - 36.0 g/dL   RDW 96.2  95.2 - 84.1 %   Platelets 328  150 - 400 K/uL   No results found.  Review of Systems  Constitutional: Negative for fever and weight loss.  Respiratory: Negative for cough and shortness of breath.   Cardiovascular: Negative for chest pain.  Gastrointestinal: Positive for nausea, vomiting and abdominal pain.       N/V resolved  Neurological: Negative for weakness and headaches.    Blood pressure 130/87, pulse 77, temperature 97.5 F (36.4 C), temperature source Oral, resp. rate 18, height 5\' 3"  (1.6 m), weight 326 lb (147.873 kg), last menstrual period 01/10/2013, SpO2 98.00%. Physical Exam  Constitutional: She is oriented to person, place, and time.  Cardiovascular: Normal rate, regular rhythm and normal heart sounds.   No murmur heard. Respiratory: Effort normal and breath sounds normal. She has no wheezes.  GI: Soft. Bowel sounds are normal. There is tenderness.  Obese L abd painful  Musculoskeletal: Normal range of motion.  Neurological: She is alert and oriented to person, place, and time.  Skin: Skin is warm.  Psychiatric: She has a normal mood and affect. Her behavior is normal.  Judgment and thought content normal.     Assessment/Plan L abd seroma post hysterectomy 02/13/13 Painful  Scheduled for seroma aspiration Pt aware of procedure benefits and risks and agreeable to proceed Consent signed and in chart Known abd mass near stomach- general surgery consult today  Catrice Zuleta A 03/19/2013, 8:19 AM

## 2013-03-19 NOTE — Addendum Note (Signed)
Encounter addended by: Berdie Ogren, RN on: 03/19/2013 11:29 AM<BR>     Documentation filed: Inpatient Discharge Instruction Writer, Discharge Instructions, Orders

## 2013-03-19 NOTE — Addendum Note (Signed)
Encounter addended by: Brigid Re, RN on: 03/19/2013 11:46 AM<BR>     Documentation filed: Notes Section

## 2013-03-19 NOTE — Patient Instructions (Signed)
You have a mass coming off of your stomach consistent with a gastrointestinal stromal tumor - GIST tumor We will remove it in the operating room

## 2013-03-19 NOTE — Progress Notes (Signed)
Patient ID: Stacy Hill, female   DOB: 07/26/1971, 42 y.o.   MRN: [id number]  Chief Complaint  Patient presents with  . 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 female.   HPI 42 yo morbidly obese AAF referred by Dr Cherly Hensen for evaluation of a gastric mass. The patient states that she has been having problems with her periods. She had heavy flow as well as a uterine leiomyoma. This prompted her to undergo a hysterectomy. It was supposed to be robotic however she was converted to an open total abdominal hysterectomy due to omental adhesions to her abdominal wall. Postoperatively she developed periumbilical pain and swelling. This prompted a CT scan which demonstrated a large subcutaneous seroma in her lower midline position as well as a gastric mass. The mass was immediately adjacent to the greater curvature of the stomach near the gastric fundus. The mass appeared to be lobular approximately 7.7 x 5.4 x 4.8 cm. 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 She underwent percutaneous drainage of the seroma this morning in radiology. She was referred here to discuss management of the gastric mass.  She states that she has been belching and burping for a long time but recently worsened after her hysterectomy. She describes developing burping spells. She also reports feeling bloated over the past couple months. She also reports a sensation of intermittent nausea. She does have problems with periods of constipation. She reports getting full quick. She was taking phentermine for weight loss but stopped 3 weeks prior to her hysterectomy. She denies any melena or hematochezia. She denies any NSAID use on a frequent basis. She denies any unplanned weight loss. She denies any family history of malignancy other than bladder cancer. Past Medical History  Diagnosis Date  . Fibroid   . Depression   . Seasonal allergies   . Shortness of breath      On exhertion,recently,with anemia per pt.  . Anemia   . Headache(784.0)   . GERD (gastroesophageal reflux disease)     Takes Tums prn.  . Restless leg syndrome   . Anxiety     Last visit to [facility] 2008 gave birth to stillborn, pt teary today discussing    Past Surgical History  Procedure Laterality Date  . Cholecystectomy    . Cesarean section    . Abdominal hysterectomy N/A 02/13/2013    Procedure: HYSTERECTOMY ABDOMINAL WITH BILATERAL SALPINGECTOMY WITH EXTENSIVE LYSIS OF ADHESIONS;  Surgeon: Genia Del, MD;  Location: [facility];  Service: Gynecology;  Laterality: N/A;    Family History  Problem Relation Age of Onset  . Cancer Mother     Bladder cancer  . Hypertension Mother   . COPD Mother   . Hypertension Sister   . Heart attack Maternal Grandfather     Social History History  Substance Use Topics  . Smoking status: Never Smoker   . Smokeless tobacco: Never Used  . Alcohol Use: Yes     Comment: rare    Allergies  Allergen Reactions  . Ivp Dye (Iodinated Diagnostic Agents) Hives  . Shellfish Allergy Hives    Current Outpatient Prescriptions  Medication Sig Dispense Refill  . cetirizine (ZYRTEC) 10 MG tablet Take 20 mg by mouth daily.      . diphenhydrAMINE (BENADRYL) 25 MG tablet Take 50 mg by mouth at bedtime as needed for itching.       . ferrous sulfate 325 (65  FE) MG tablet Take 650 mg by mouth daily with breakfast.      . FLUoxetine (PROZAC) 10 MG capsule Take 30 mg by mouth daily.       Marland Kitchen ibuprofen (ADVIL,MOTRIN) 200 MG tablet Take 200 mg by mouth every 6 (six) hours as needed for pain.      . Multiple Vitamin (MULTIVITAMIN WITH MINERALS) TABS Take 1 tablet by mouth daily.      Marland Kitchen oxyCODONE-acetaminophen (PERCOCET/ROXICET) 5-325 MG per tablet Take 1 tablet by mouth every 4 (four) hours as needed for pain.  15 tablet  0   No current facility-administered medications for this visit.   Facility-Administered Medications Ordered in Other Visits    Medication Dose Route Frequency Provider Last Rate Last Dose  . fentaNYL (SUBLIMAZE) 0.05 MG/ML injection           . midazolam (VERSED) 2 MG/2ML injection             Review of Systems Review of Systems  Constitutional: Negative for fever, chills and unexpected weight change.  HENT: Negative for hearing loss, congestion, sore throat, trouble swallowing and voice change.   Eyes: Negative for visual disturbance.  Respiratory: Negative for cough and wheezing.   Cardiovascular: Negative for chest pain, palpitations and leg swelling.       +DOE, +orthopnea.   Gastrointestinal: Positive for nausea, abdominal pain and constipation. Negative for vomiting, diarrhea, blood in stool, abdominal distention and anal bleeding.  Genitourinary: Negative for hematuria, vaginal bleeding and difficulty urinating.       See HPI  Musculoskeletal: Negative for arthralgias.  Skin: Negative for rash and wound.  Neurological: Negative for seizures, syncope and headaches.  Hematological: Negative for adenopathy. Does not bruise/bleed easily.  Psychiatric/Behavioral: Negative for confusion.    Blood pressure 118/80, pulse 98, temperature 98.9 F (37.2 C), temperature source Temporal, resp. rate 18, height 5\' 3"  (1.6 m), weight 322 lb (146.058 kg), last menstrual period 01/10/2013, SpO2 98.00%.  Physical Exam Physical Exam  Vitals reviewed. Constitutional: She is oriented to person, place, and time. She appears well-developed and well-nourished. No distress.  Morbidly obese, burping consistently throughout appt  HENT:  Head: Normocephalic and atraumatic.  Right Ear: External ear normal.  Left Ear: External ear normal.  Eyes: Conjunctivae are normal. No scleral icterus.  Neck: Normal range of motion. Neck supple. No tracheal deviation present. No thyromegaly present.  Cardiovascular: Normal rate, regular rhythm, normal heart sounds and intact distal pulses.   Pulmonary/Chest: Effort normal and breath  sounds normal. No respiratory distress. She has no wheezes.  Abdominal: Soft. She exhibits no distension. There is no tenderness. There is no rebound and no guarding.    Obese, lower abd pannus  Musculoskeletal: Normal range of motion. She exhibits no edema and no tenderness.  Lymphadenopathy:    She has no cervical adenopathy.  Neurological: She is alert and oriented to person, place, and time.  Skin: Skin is warm and dry. No rash noted. She is not diaphoretic. No erythema.  Psychiatric: She has a normal mood and affect. Her behavior is normal. Judgment and thought content normal.    Data Reviewed Dr Sharol Roussel office notes Dr Sharol Roussel OP note from [facility] procedure Path report from hysterectomy CTs from 5/28, 6/1, 6/3 ED note  CT ABDOMEN AND PELVIS WITH CONTRAST 03/13/13 Technique: Multidetector CT imaging of the abdomen and pelvis was  performed following the standard protocol during bolus  administration of intravenous contrast.  Contrast: OMNIPAQUE IOHEXOL 300 MG/ML SOLN  Comparison: CT abdomen pelvis of 06/27/2007 and MRI pelvis of  01/23/2013  Findings: The lung bases are clear. The liver enhances with no  focal abnormality and no ductal dilatation is seen. Surgical clips  are noted from prior cholecystectomy. The pancreas is normal in  size and the pancreatic duct is not dilated. The adrenal glands  and spleen are unremarkable. The stomach is moderately fluid  distended. Immediately adjacent to the greater curvature of the  stomach near the gastric fundus there is a lobular soft tissue mass  present of approximately 7.7 x 5.4 x 4.8 cm, not seen on the prior  unenhanced study from 2008. This lesion has an attenuation of 56  HU. This most likely represents a gastrointestinal stromal tumor,  (G I S T tumor) being associated with the gastric wall. The more  distal stomach is unremarkable. The kidneys enhance with no calculi  and no hydronephrosis is seen. The abdominal aorta  is normal in  caliber. No adenopathy is noted.  Slightly to the left of midline and below the umbilicus there is an  oval subcutaneous collection present with an attenuation of 30 HU,  measuring 6.6 by 13.4 x 7.8 cm most consistent with postoperative  seroma. Clinical correlation is recommended.  The uterus has been resected. No adnexal lesion is seen. No fluid  is seen within the pelvis. Postoperative strandiness is noted  anteriorly within the pelvis. The urinary bladder is not well  distended. No abnormality of the colon is seen. The terminal  ileum and appendix are unremarkable. No acute bony abnormality is  seen.   IMPRESSION:  1. Soft tissue mass high in the left upper quadrant adjacent to  the fundus of the stomach most consistent with a gastrointestinal  stromal tumor of 7.7 x 5.4 x 4.8 cm.  2. Oval fluid collection just to the left midline below the  umbilicus most consistent with postoperative seroma of 6.6 x 13.4 x  7.8 cm.  Assessment    Gastric fundal mass Morbid obesity S/p TAH with postoperative seroma s/p aspiration     Plan    I reviewed the imaging with the patient and her family members. 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 We discussed gastrointestinal stromal tumors. We discussed how they can they have benign or malignant. We discussed management of this gastric mass.  We discussed 2 options-upper endoscopy with EUS and biopsy to confirm that the mass is a gastrointestinal stromal tumor or proceeding to the operating room for upper endoscopy and laparoscopic gastric wedge resection. We discussed the pros and cons of each.  We discussed the risk and benefits of surgery including but not limited to conversion to an open procedure, bleeding, infection, staple line leak, staple line bleed, blood clot formation, incisional hernia, need for additional procedures, ileus, and the typical postoperative recovery course.  We did discuss the  possibility of needing additional treatment such as IV medication pending on the final pathology report. We also discussed that the patient would need ongoing surveillance to monitor for signs of recurrence.  It appears the mass is abutting her diaphragm and hopefully by removing the mass and will ameliorate her belching and burping. However I made no guarantees.  After answering all of her questions, the patient would like to proceed directly to the operating room.  She will be scheduled for upper endoscopy, laparoscopic gastric wedge resection in the near future  Stacy Hill M. Andrey Campanile, MD, FACS General, Bariatric, & Minimally Invasive [facility]        [facility] M 03/19/2013, 5:13 PM

## 2013-03-19 NOTE — Progress Notes (Signed)
Pt. Awake and alert.  Instructions post biopsy with moderate sedation explained to pt. And her mom. Pt. Verbalized understanding.

## 2013-03-19 NOTE — Addendum Note (Signed)
Encounter addended by: Brigid Re, RN on: 03/19/2013 11:25 AM<BR>     Documentation filed: Orders

## 2013-03-19 NOTE — Procedures (Signed)
SQ fat seroma aspiration 230 cc dark fluid Sent for culture. No comp

## 2013-03-21 ENCOUNTER — Telehealth (INDEPENDENT_AMBULATORY_CARE_PROVIDER_SITE_OTHER): Payer: Self-pay

## 2013-03-21 NOTE — Telephone Encounter (Signed)
Stacy Hill called from Dr Sharol Roussel office and reports the patient didn't feel comfortable with Dr Andrey Campanile after seeing him 6/3.  They want her to see someone else.  I read Dr Tawana Scale note and he had made plans for surgery.  I told her I wanted to let him know before we change doctors since he saw the patient and spent time with her.  Please advise if ok to schedule with another md.

## 2013-03-22 LAB — CULTURE, ROUTINE-ABSCESS

## 2013-03-22 NOTE — Telephone Encounter (Signed)
Spoke to [place] and she was given new appt information to pass onto the patient

## 2013-03-22 NOTE — Telephone Encounter (Signed)
Spoke with Silvio Pate at [employer] office. Pt did not give much information about reason for requesting a difft surgeon. She did pt was reluctant to see a surgeon in the first place regarding the gastric mass. We will honor pt's request and set up her with another [facility] surgeon

## 2013-03-24 LAB — ANAEROBIC CULTURE

## 2013-04-02 ENCOUNTER — Encounter (INDEPENDENT_AMBULATORY_CARE_PROVIDER_SITE_OTHER): Payer: Self-pay | Admitting: Surgery

## 2013-04-02 ENCOUNTER — Ambulatory Visit (INDEPENDENT_AMBULATORY_CARE_PROVIDER_SITE_OTHER): Payer: BC Managed Care – PPO | Admitting: Surgery

## 2013-04-02 VITALS — BP 128/88 | HR 82 | Temp 97.6°F | Resp 16 | Ht 63.0 in | Wt 328.0 lb

## 2013-04-02 DIAGNOSIS — K319 Disease of stomach and duodenum, unspecified: Secondary | ICD-10-CM

## 2013-04-02 DIAGNOSIS — K3189 Other diseases of stomach and duodenum: Secondary | ICD-10-CM

## 2013-04-02 NOTE — Progress Notes (Signed)
The patient is here for a second opinion visit for her large GI stromal tumor. I reviewed the records from Dr. Andrey Campanile as well as the patient's CT scan. The patient has symptoms of chronic belching and epigastric discomfort with meals. This epigastric discomfort is slightly improved which is a bland diet. However she enjoys spicy foods and these tend to bother her more. She had her hysterectomy recently and seems to be recovering from that nicely. The seroma was aspirated and she feels much better. She has some concern about further surgery while she is still on her short-term disability.  Impression 7.7 cm gastric mass Located on the greater curvature of the stomach near the gastric fundus. This is most consistent with a GI stromal tumor. I agree with Dr. Tawana Scale recommendation for resection. The patient could undergo endoscopy with possible EUS and needle aspiration. However I don't think that that would change the recommendation to have this mass resected. I did mention to her that bariatric surgeons operated on the stomach routinely and that she would be better suited to have Dr. Andrey Campanile on the bariatric surgeons or one of our foregut surgeons to perform her surgery. As large as this mass seems to measure, I will probably attempt this to open procedure. She is interested in pursuing laparoscopic approach. We will have her see Dr. Michaell Cowing tomorrow for another opinion and surgical management.  Wilmon Arms. Corliss Skains, MD, [facility]  General/ Trauma Surgery  04/02/2013 10:37 AM

## 2013-04-03 ENCOUNTER — Ambulatory Visit (INDEPENDENT_AMBULATORY_CARE_PROVIDER_SITE_OTHER): Payer: BC Managed Care – PPO | Admitting: Surgery

## 2013-04-03 VITALS — BP 128/76 | HR 88 | Resp 14 | Ht 63.0 in | Wt 327.2 lb

## 2013-04-03 DIAGNOSIS — K3189 Other diseases of stomach and duodenum: Secondary | ICD-10-CM

## 2013-04-03 DIAGNOSIS — K319 Disease of stomach and duodenum, unspecified: Secondary | ICD-10-CM

## 2013-04-03 NOTE — Patient Instructions (Addendum)
See the Handout(s) we gave you.  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  Please call our office at [phone] if you wish to schedule surgery or if you have further questions / concerns.   LAPAROSCOPIC SURGERY: POST OP INSTRUCTIONS  1. DIET: Follow a light bland diet the first 24 hours after arrival home, such as soup, liquids, crackers, etc.  Be sure to include lots of fluids daily.  Avoid fast food or heavy meals as your are more likely to get nauseated.  Eat a low fat the next few days after surgery.   2. Take your usually prescribed home medications unless otherwise directed. 3. PAIN CONTROL: a. Pain is best controlled by a usual combination of three different methods TOGETHER: i. Ice/Heat ii. Over the counter pain medication iii. Prescription pain medication b. Most patients will experience some swelling and bruising around the incisions.  Ice packs or heating pads (30-60 minutes up to 6 times a day) will help. Use ice for the first few days to help decrease swelling and bruising, then switch to heat to help relax tight/sore spots and speed recovery.  Some people prefer to use ice alone, heat alone, alternating between ice & heat.  Experiment to what works for you.  Swelling and bruising can take several weeks to resolve.   c. It is helpful to take an over-the-counter pain medication regularly for the first few weeks.  Choose one of the following that works best for you: i. Naproxen (Aleve, etc)  Two 220mg  tabs twice a day ii. Ibuprofen (Advil, etc) Three 200mg  tabs four times a day (every meal & bedtime) iii. Acetaminophen (Tylenol, etc) 500-650mg  four times a day (every meal & bedtime) d. A  prescription for pain medication (such as oxycodone, hydrocodone, etc) should be given to you upon discharge.  Take your pain medication as prescribed.  i. If you are having problems/concerns with the prescription medicine (does not control  pain, nausea, vomiting, rash, itching, etc), please call us [phone] to see if we need to switch you to a different pain medicine that will work better for you and/or control your side effect better. ii. If you need a refill on your pain medication, please contact your pharmacy.  They will contact our office to request authorization. Prescriptions will not be filled after 5 pm or on week-ends. 4. Avoid getting constipated.  Between the surgery and the pain medications, it is common to experience some constipation.  Increasing fluid intake and taking a fiber supplement (such as Metamucil, Citrucel, FiberCon, MiraLax, etc) 1-2 times a day regularly will usually help prevent this problem from occurring.  A mild laxative (prune juice, Milk of Magnesia, MiraLax, etc) should be taken according to package directions if there are no bowel movements after 48 hours.   5. Watch out for diarrhea.  If you have many loose bowel movements, simplify your diet to bland foods & liquids for a few days.  Stop any stool softeners and decrease your fiber supplement.  Switching to mild anti-diarrheal medications (Kayopectate, Pepto Bismol) can help.  If this worsens or does not improve, please call us. 6. Wash / shower every day.  You may shower over the dressings as they are waterproof.  Continue to shower over incision(s) after the dressing is off. 7. Remove your waterproof bandages 5 days after surgery.  You may leave the incision open to air.  You may replace a dressing/Band-Aid to cover the  incision for comfort if you wish.  8. ACTIVITIES as tolerated:   a. You may resume regular (light) daily activities beginning the next day-such as daily self-care, walking, climbing stairs-gradually increasing activities as tolerated.  If you can walk 30 minutes without difficulty, it is safe to try more intense activity such as jogging, treadmill, bicycling, low-impact aerobics, swimming, etc. b. Save the most intensive and  strenuous activity for last such as sit-ups, heavy lifting, contact sports, etc  Refrain from any heavy lifting or straining until you are off narcotics for pain control.   c. DO NOT PUSH THROUGH PAIN.  Let pain be your guide: If it hurts to do something, don't do it.  Pain is your body warning you to avoid that activity for another week until the pain goes down. d. You may drive when you are no longer taking prescription pain medication, you can comfortably wear a seatbelt, and you can safely maneuver your car and apply brakes. e. Bonita Quin may have sexual intercourse when it is comfortable.  9. FOLLOW UP in our office a. Please call [facility] at [phone] to set up an appointment to see your surgeon in the office for a follow-up appointment approximately 2-3 weeks after your surgery. b. Make sure that you call for this appointment the day you arrive home to insure a convenient appointment time. 10. IF YOU HAVE DISABILITY OR FAMILY LEAVE FORMS, BRING THEM TO THE OFFICE FOR PROCESSING.  DO NOT GIVE THEM TO YOUR DOCTOR.   WHEN TO CALL us [phone]: 1. Poor pain control 2. Reactions / problems with new medications (rash/itching, nausea, etc)  3. Fever over 101.5 F (38.5 C) 4. Inability to urinate 5. Nausea and/or vomiting 6. Worsening swelling or bruising 7. Continued bleeding from incision. 8. Increased pain, redness, or drainage from the incision   The clinic staff is available to answer your questions during regular business hours (8:30am-5pm).  Please don't hesitate to call and ask to speak to one of our nurses for clinical concerns.   If you have a medical emergency, go to the nearest emergency room or call 911.  A surgeon from [facility] is always on call at the [facility], [place] [address], [place], [place]  [address] ? MAIN: [phone] ? TOLL FREE: [phone] ?  FAX [phone] [email or web address]  EATING  AFTER YOUR ESOPHAGEAL SURGERY (Stomach Fundoplication, Hiatal Hernia repair, Achalasia surgery, etc)  After your esophageal surgery, expect some sticking with swallowing over the next 1-2 months.    If food sticks when you eat, it is called "dysphagia".  This is due to swelling around your esophagus at the wrap & hiatal diaphragm repair.  It will gradually ease off over the next few months.  To help you through this temporary phase, we start you out on a pureed (blenderized) diet.  Your first meal in the hospital was thin liquids.  You should have been given a pureed diet by the time you left the hospital.  We ask patients to stay on a pureed diet for the first 2-3 weeks to avoid anything getting "stuck" near your recent surgery.  Don't be alarmed if your ability to swallow doesn't progress according to this plan.  Everyone is different and some diets can advance more or less quickly.     Some BASIC RULES to follow are:  Maintain an upright position whenever eating or drinking.  Take small bites - just a  teaspoon size bite at a time.  Eat slowly.  It may also help to eat only one food at a time.  Consider nibbling through smaller, more frequent meals & avoid the urge to eat BIG meals  Do not push through feelings of fullness, nausea, or bloatedness  Do not mix solid foods and liquids in the same mouthful  Try not to "wash foods down" with large gulps of liquids. Avoid carbonated (bubbly/fizzy) drinks.  Understand that it will be hard to burp and belch at first.  This gradually improves with time.  Expect to be more gassy/flatulent/bloated initially.  Walking will help you work through that.  Maalox/Gas-X can help as well.  Eat in a relaxed atmosphere & minimize distractions.  Avoid talking while eating.    Do not use straws.  Following each meal, sit in an upright position (90 degree angle) for 60 to 90 minutes.  Going for a short walk can help as well  If food does stick, don't  panic.  Try to relax and let the food pass on its own.  Sipping WARM LIQUID such as strong hot black tea can also help slide it down.   Be gradual in changes & use common sense:  -If you easily tolerating a certain "level" of foods, advance to the next level gradually -If you are having trouble swallowing a particular food, then avoid it.   -If food is sticking when you advance your diet, go back to thinner previous diet (the lower LEVEL) for 1-2 days.  LEVEL 1 = PUREED DIET  Do for the first 2 WEEKS AFTER SURGERY  -Foods in this group are pureed or blenderized to a smooth, mashed potato-like consistency.  -If necessary, the pureed foods can keep their shape with the addition of a thickening agent.   -Meat should be pureed to a smooth, pasty consistency.  Hot broth or gravy may be added to the pureed meat, approximately 1 oz. of liquid per 3 oz. serving of meat. -CAUTION:  If any foods do not puree into a smooth consistency, swallowing will be more difficult.  (For example, nuts or seeds sometimes do not blend well.)  Hot Foods Cold Foods  Pureed scrambled eggs and cheese Pureed cottage cheese  Baby cereals Thickened juices and nectars  Thinned cooked cereals (no lumps) Thickened milk or eggnog  Pureed [place] toast or pancakes Ensure  Mashed potatoes Ice cream  Pureed parsley, au gratin, scalloped potatoes, candied sweet potatoes Fruit or [place] ice, sherbet  Pureed buttered or alfredo noodles Plain yogurt  Pureed vegetables (no corn or peas) Instant breakfast  Pureed soups and creamed soups Smooth pudding, mousse, custard  Pureed scalloped apples Whipped gelatin  Gravies Sugar, syrup, honey, jelly  Sauces, cheese, tomato, barbecue, white, creamed Cream  Any baby food Creamer  Alcohol in moderation (not beer or champagne) Margarine  Coffee or tea Mayonnaise   Ketchup, mustard   Apple sauce   SAMPLE MENU:  PUREED DIET Breakfast Lunch Dinner   Orange juice, 1/2 cup  Cream of  wheat, 1/2 cup  Pineapple juice, 1/2 cup  Pureed [place], barley soup, 3/4 cup  Pureed Hawaiian chicken, 3 oz   Scrambled eggs, mashed or blended with cheese, 1/2 cup  Tea or coffee, 1 cup   Whole milk, 1 cup   Non-dairy creamer, 2 Tbsp.  Mashed potatoes, 1/2 cup  Pureed cooled broccoli, 1/2 cup  Apple sauce, 1/2 cup  Coffee or tea  Mashed potatoes, 1/2 cup  Pureed  spinach, 1/2 cup  Frozen yogurt, 1/2 cup  Tea or coffee      LEVEL 2 = SOFT DIET  After your first 2 weeks, you can advance to a soft diet.   Keep on this diet until everything goes down easily.  Hot Foods Cold Foods  White fish Cottage cheese  Stuffed fish Junior baby fruit  Baby food meals Semi thickened juices  Minced soft cooked, scrambled, poached eggs nectars  Souffle & omelets Ripe mashed bananas  Cooked cereals Canned fruit, pineapple sauce, milk  potatoes Milkshake  Buttered or Alfredo noodles Custard  Cooked cooled vegetable Puddings, including tapioca  Sherbet Yogurt  Vegetable soup or alphabet soup Fruit ice, [place] ice  Gravies Whipped gelatin  Sugar, syrup, honey, jelly Junior baby desserts  Sauces:  Cheese, creamed, barbecue, tomato, white Cream  Coffee or tea Margarine   SAMPLE MENU:  LEVEL 2 Breakfast Lunch Dinner   Orange juice, 1/2 cup  Oatmeal, 1/2 cup  Scrambled eggs with cheese, 1/2 cup  Decaffeinated tea, 1 cup  Whole milk, 1 cup  Non-dairy creamer, 2 Tbsp  Pineapple juice, 1/2 cup  Minced beef, 3 oz  Gravy, 2 Tbsp  Mashed potatoes, 1/2 cup  Minced fresh broccoli, 1/2 cup  Applesauce, 1/2 cup  Coffee, 1 cup  [place], barley soup, 3/4 cup  Minced Hawaiian chicken, 3 oz  Mashed potatoes, 1/2 cup  Cooked spinach, 1/2 cup  Frozen yogurt, 1/2 cup  Non-dairy creamer, 2 Tbsp      LEVEL 3 = CHOPPED DIET  -After all the foods in level 2 (soft diet) are passing through well you should advance up to more chopped foods.  -It is still important to  cut these foods into small pieces and eat slowly.  Hot Foods Cold Foods  Poultry Cottage cheese  Chopped Swedish meatballs Yogurt  Meat salads (ground or flaked meat) Milk  Flaked fish (tuna) Milkshakes  Poached or scrambled eggs Soft, cold, dry cereal  Souffles and omelets Fruit juices or nectars  Cooked cereals Chopped canned fruit  Chopped [place] toast or pancakes Canned fruit cocktail  Noodles or pasta (no rice) Pudding, mousse, custard  Cooked vegetables (no frozen peas, corn, or mixed vegetables) Green salad  Canned small sweet peas Ice cream  Creamed soup or vegetable soup Fruit ice, [place] ice  Pureed vegetable soup or alphabet soup Non-dairy creamer  Ground scalloped apples Margarine  Gravies Mayonnaise  Sauces:  Cheese, creamed, barbecue, tomato, white Ketchup  Coffee or tea Mustard   SAMPLE MENU:  LEVEL 3 Breakfast Lunch Dinner   Orange juice, 1/2 cup  Oatmeal, 1/2 cup  Scrambled eggs with cheese, 1/2 cup  Decaffeinated tea, 1 cup  Whole milk, 1 cup  Non-dairy creamer, 2 Tbsp  Ketchup, 1 Tbsp  Margarine, 1 tsp  Salt, 1/4 tsp  Sugar, 2 tsp  Pineapple juice, 1/2 cup  Ground beef, 3 oz  Gravy, 2 Tbsp  Mashed potatoes, 1/2 cup  Cooked spinach, 1/2 cup  Applesauce, 1/2 cup  Decaffeinated coffee  Whole milk  Non-dairy creamer, 2 Tbsp  Margarine, 1 tsp  Salt, 1/4 tsp  Pureed [place], barley soup, 3/4 cup  Barbecue chicken, 3 oz  Mashed potatoes, 1/2 cup  Ground fresh broccoli, 1/2 cup  Frozen yogurt, 1/2 cup  Decaffeinated tea, 1 cup  Non-dairy creamer, 2 Tbsp  Margarine, 1 tsp  Salt, 1/4 tsp  Sugar, 1 tsp    LEVEL 4:  REGULAR FOODS  -Foods in this group are soft,  moist, regularly textured foods.   -This level includes meat and breads, which tend to be the hardest things to swallow.   -Eat very slowly, chew well and continue to avoid carbonated drinks. -most people are at this level in 4-6 weeks  Hot Foods Cold Foods   Baked fish or skinned Soft cheeses - cottage cheese  Souffles and omelets Cream cheese  Eggs Yogurt  Stuffed shells Milk  Spaghetti with meat sauce Milkshakes  Cooked cereal Cold dry cereals (no nuts, dried fruit, coconut)  [place] toast or pancakes Crackers  Buttered toast Fruit juices or nectars  Noodles or pasta (no rice) Canned fruit  Potatoes (all types) Ripe bananas  Soft, cooked vegetables (no corn, lima, or baked beans) Peeled, ripe, fresh fruit  Creamed soups or vegetable soup Cakes (no nuts, dried fruit, coconut)  Canned chicken noodle soup Plain doughnuts  Gravies Ice cream  Bacon dressing Pudding, mousse, custard  Sauces:  Cheese, creamed, barbecue, tomato, white Fruit ice, [place] ice, sherbet  Decaffeinated tea or coffee Whipped gelatin  Pork chops Regular gelatin   Canned fruited gelatin molds   Sugar, syrup, honey, jam, jelly   Cream   Non-dairy   Margarine   Oil   Mayonnaise   Ketchup   Mustard    If you have any questions please call our office at [facility]: [phone].

## 2013-04-03 NOTE — Progress Notes (Signed)
Subjective:     Patient ID: Stacy Hill, female   DOB: 1971-05-09, 42 y.o.   MRN: [id number]  HPI  Stacy Hill  June 21, 1971 [id number]  Patient Care Team: No Pcp Per Patient as PCP - General (General Practice) Genia Del, MD as Consulting Physician (Obstetrics and Gynecology)  This patient is a 42 y.o.female who presents today for surgical evaluation at the request of Dr. Harlon Flor.   Reason for visit: Enlarging upper abdominal mass.  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  She comes today with her family including her mother.  She had a recent abdominal hysterectomy with salpingectomy.  Developed some swelling.  CT scan confirmed an incisional seroma.  Also noted was a large mass in near the upper stomach.  Suspicious for gastrointestinal stromal tumor.  7.7 cm in size.  Patient has had issues with heartburn and reflux for a fair amount of her life.  She has lost weight and modified her diet.  She has been on Nexium and Prevacid in the past.  Occasionally uses Prevacid and TUMS now.  It seems to be in relatively decent control.  However, does have a lot of belching.  Some occasional hiccups.  No real dysphagia to solids or liquids.  No early satiety or bloating.  Normally has a bowel movement every day.  Can walk 20 minutes easily before having to stop.  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  She was sent to surgery to consider removal of the mass.  Recommendation was made for a laparoscopic possible open resection.  She wished to get extra opinions.  Therefore, my partner sent the patient to me for evaluation.  Patient Active Problem List   Diagnosis Date Noted  . 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 S/P hysterectomy 02/14/2013  . Fibroid     Past Medical History  Diagnosis Date  . Fibroid   . Depression   . Seasonal allergies   . Shortness of breath     On exhertion,recently,with anemia per  pt.  . Anemia   . Headache(784.0)   . GERD (gastroesophageal reflux disease)     Takes Tums prn.  . Restless leg syndrome   . Anxiety     Last visit to [facility] 2008 gave birth to stillborn, pt teary today discussing    Past Surgical History  Procedure Laterality Date  . Cholecystectomy    . Cesarean section    . Abdominal hysterectomy N/A 02/13/2013    Procedure: HYSTERECTOMY ABDOMINAL WITH BILATERAL SALPINGECTOMY WITH EXTENSIVE LYSIS OF ADHESIONS;  Surgeon: Genia Del, MD;  Location: [facility];  Service: Gynecology;  Laterality: N/A;    History   Social History  . Marital Status: Single    Spouse Name: N/A    Number of Children: N/A  . Years of Education: N/A   Occupational History  . Not on file.   Social History Main Topics  . Smoking status: Never Smoker   . Smokeless tobacco: Never Used  . Alcohol Use: Yes     Comment: rare  . Drug Use: No  . Sexually Active: No   Other Topics Concern  . Not on file   Social History Narrative  . No narrative on file    Family History  Problem Relation Age of Onset  . Cancer Mother     Bladder cancer  . Hypertension Mother   . COPD Mother   . Hypertension Sister   . Heart  attack Maternal Grandfather     Current Outpatient Prescriptions  Medication Sig Dispense Refill  . cetirizine (ZYRTEC) 10 MG tablet Take 20 mg by mouth daily.      . diphenhydrAMINE (BENADRYL) 25 MG tablet Take 50 mg by mouth at bedtime as needed for itching.       . ferrous sulfate 325 (65 FE) MG tablet Take 650 mg by mouth daily with breakfast.      . FLUoxetine (PROZAC) 10 MG capsule Take 30 mg by mouth daily.       Marland Kitchen ibuprofen (ADVIL,MOTRIN) 200 MG tablet Take 200 mg by mouth every 6 (six) hours as needed for pain.      . Multiple Vitamin (MULTIVITAMIN WITH MINERALS) TABS Take 1 tablet by mouth daily.      Marland Kitchen oxyCODONE-acetaminophen (PERCOCET/ROXICET) 5-325 MG per tablet Take 1 tablet by mouth every 4 (four) hours as needed for pain.  15 tablet  0     No current facility-administered medications for this visit.     Allergies  Allergen Reactions  . Ivp Dye (Iodinated Diagnostic Agents) Hives  . Shellfish Allergy Hives    BP 128/76  Pulse 88  Resp 14  Ht 5\' 3"  (1.6 m)  Wt 327 lb 3.2 oz (148.417 kg)  BMI 57.98 kg/m2  LMP 01/10/2013  Ct Abdomen Pelvis Wo Contrast  03/17/2013   *RADIOLOGY REPORT*  Clinical Data: Abdominal pain  CT ABDOMEN AND PELVIS WITHOUT CONTRAST  Technique:  Multidetector CT imaging of the abdomen and pelvis was performed following the standard protocol without intravenous contrast.  Comparison: 01/24/1939 MRI, 02/23/1939 CT  Findings: Limited images through the lung bases demonstrate no significant appreciable abnormality. The heart size is within normal limits. No pleural or pericardial effusion.  Organ abnormality/lesion detection is limited in the absence of intravenous contrast. Within this limitation, unremarkable liver, spleen, pancreas, adrenal glands.  Absent gallbladder.  No biliary ductal dilatation.  Symmetric renal size.  No hydronephrosis or hydroureter.  No CT evidence for colitis.  Normal appendix.  Small bowel loops are normal course and caliber.  Lobular mass arising off the posterolateral margin of the stomach, measuring approximately 6.6 x 5.6 cm.  No free intraperitoneal air.  Reactive size inguinal lymph nodes.  Normal caliber aorta and branch vessels.  Streak artifact from patient contact with the gantry obscures detailed evaluation of the pelvis.  Mild stranding of the anterior peritoneal fat, in keeping with the recent hysterectomy.  Partially decompressed bladder. 4.7 x 2.9 cm soft tissue attenuation along the left adnexa, abutting the external iliac vasculature.  A loop of bowel herniates between the rectus abdominus musculature. Anterior subcutaneous fluid collection measuring 7.1 x 12.6 cm, slightly higher than that of water density.  Multilevel degenerative changes without acute osseous finding.   IMPRESSION: Anterior subcutaneous fluid collection is similar to recent prior at 7.1 x 12.6 cm.  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  Recommend surgical consultation.  Postoperative changes of recent hysterectomy with a small amount of blood/stranding within the pelvis.  4.7 x 2.9 cm density along the left adnexa/external iliac vasculature may reflect the ovary. A pelvic ultrasound could better characterize.   Original Report Authenticated By: Jearld Lesch, M.D.   Ct Abdomen Pelvis W Contrast  03/14/2013   *RADIOLOGY REPORT*  Clinical Data: Enlarging left sided abdominal mass, prior hysterectomy  CT ABDOMEN AND PELVIS WITH CONTRAST  Technique:  Multidetector CT imaging of the abdomen  and pelvis was performed following the standard protocol during bolus administration of intravenous contrast.  Contrast: OMNIPAQUE IOHEXOL 300 MG/ML  SOLN  Comparison: CT abdomen pelvis of 06/27/2007 and MRI pelvis of 01/23/2013  Findings: The lung bases are clear.  The liver enhances with no focal abnormality and no ductal dilatation is seen.  Surgical clips are noted from prior cholecystectomy.  The pancreas is normal in size and the pancreatic duct is not dilated.  The adrenal glands and spleen are unremarkable.  The stomach is moderately fluid distended. Immediately adjacent to the greater curvature of the stomach near the gastric fundus there is a lobular soft tissue mass present of approximately 7.7 x 5.4 x 4.8 cm, not seen on the prior unenhanced study from 2008. This lesion has an attenuation of 56 HU.  This most likely represents a gastrointestinal stromal tumor, (G I S T tumor) being associated with the gastric wall.  The more distal stomach is unremarkable. The kidneys enhance with no calculi and no hydronephrosis is seen.  The abdominal aorta is normal in caliber.  No adenopathy is noted.  Slightly to the left of midline and below  the umbilicus there is an oval subcutaneous collection present with an attenuation of 30 HU, measuring 6.6 by 13.4 x 7.8 cm most consistent with postoperative seroma.  Clinical correlation is recommended.  The uterus has been resected.  No adnexal lesion is seen.  No fluid is seen within the pelvis.  Postoperative strandiness is noted anteriorly within the pelvis.  The urinary bladder is not well distended.  No abnormality of the colon is seen.  The terminal ileum and appendix are unremarkable.  No acute bony abnormality is seen.  IMPRESSION:  1.  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 2.  Oval fluid collection just to the left midline below the umbilicus most consistent with postoperative seroma of 6.6 x 13.4 x 7.8 cm.   Original Report Authenticated By: Dwyane Dee, M.D.   Ct Aspiration  03/19/2013   *RADIOLOGY REPORT*  Clinical Data/Indication: ANTERIOR SUBCUTANEOUS FAT POSTOPERATIVE SEROMA.  CT GUIDANCE NEEDLE PLACEMENT  Sedation: Versed 2.0 mg, Fentanyl 100 mg.  Total Moderate Sedation Time: 13 minutes.  Procedure: The procedure, risks, benefits, and alternatives were explained to the patient. Questions regarding the procedure were encouraged and answered. The patient understands and consents to the procedure.  The lower abdomen was prepped with betadine in a sterile fashion, and a sterile drape was applied covering the operative field. A sterile gown and sterile gloves were used for the procedure.  Under CT guidance, a yearly angiocath was inserted into the anterior subcutaneous seroma.  230 ml clear dark fluid was aspirated and sent for culture.  Post aspiration imaging was performed.  Findings: Imaging demonstrates a catheter placement in the seroma. Post aspiration imaging confirms complete resolution of the fluid collection.  Complications: None.  IMPRESSION: Successful subcutaneous  seroma aspiration yielding 230 ml clear dark fluid, sent for culture.   Original Report Authenticated By: Jolaine Click, M.D.   '  Review of Systems  Constitutional: Negative for fever, chills, diaphoresis, appetite change and fatigue.  HENT: Negative for ear pain, sore throat, trouble swallowing, neck pain and ear discharge.   Eyes: Negative for photophobia, discharge and visual disturbance.  Respiratory: Negative for cough, choking, chest tightness and shortness of breath.   Cardiovascular: Negative  for chest pain and palpitations.  Gastrointestinal: Negative for nausea, vomiting, abdominal pain, diarrhea, constipation, anal bleeding and rectal pain.  Endocrine: Negative for cold intolerance and heat intolerance.  Genitourinary: Negative for dysuria, frequency and difficulty urinating.  Musculoskeletal: Negative for myalgias and gait problem.  Skin: Negative for color change, pallor and rash.  Allergic/Immunologic: Negative for environmental allergies, food allergies and immunocompromised state.  Neurological: Negative for dizziness, speech difficulty, weakness and numbness.  Hematological: Negative for adenopathy.  Psychiatric/Behavioral: Negative for confusion and agitation. The patient is not nervous/anxious.        Objective:   Physical Exam  Constitutional: She is oriented to person, place, and time. She appears well-developed and well-nourished. No distress.  HENT:  Head: Normocephalic.  Mouth/Throat: Oropharynx is clear and moist. No oropharyngeal exudate.  Eyes: Conjunctivae and EOM are normal. Pupils are equal, round, and reactive to light. No scleral icterus.  Neck: Normal range of motion. Neck supple. No tracheal deviation present.  Cardiovascular: Normal rate, regular rhythm and intact distal pulses.   Pulmonary/Chest: Effort normal and breath sounds normal. No stridor. No respiratory distress. She exhibits no tenderness.  Abdominal: Soft. She exhibits no distension, no  abdominal bruit and no mass. There is no tenderness. There is no rigidity, no rebound, no guarding, no CVA tenderness, no tenderness at McBurney's point and negative Murphy's sign. No hernia. Hernia confirmed negative in the ventral area, confirmed negative in the right inguinal area and confirmed negative in the left inguinal area.    Morbidly obese but soft.  Apple body habitus  Genitourinary: No vaginal discharge found.  Musculoskeletal: Normal range of motion. She exhibits no tenderness.       Right elbow: She exhibits normal range of motion.       Left elbow: She exhibits normal range of motion.       Right wrist: She exhibits normal range of motion.       Left wrist: She exhibits normal range of motion.       Right hand: Normal strength noted.       Left hand: Normal strength noted.  Lymphadenopathy:       Head (right side): No posterior auricular adenopathy present.       Head (left side): No posterior auricular adenopathy present.    She has no cervical adenopathy.    She has no axillary adenopathy.       Right: No inguinal adenopathy present.       Left: No inguinal adenopathy present.  Neurological: She is alert and oriented to person, place, and time. No cranial nerve deficit. She exhibits normal muscle tone. Coordination normal.  Skin: Skin is warm and dry. No rash noted. She is not diaphoretic. No erythema.  Psychiatric: She has a normal mood and affect. Her behavior is normal. Judgment and thought content normal.       Assessment:     7 cm mass Most likely coming off of upper stomach.  Most consistent with gastrointestinal stromal tumor.    Plan:     A long discussion with the patient and her family.  Numerous appropriate questions answered.  I believe this mass needs to be removed.  It is too large to observe.  Irregularity on the gastric border and CT findings seem consistent with a gastric GIST stromal tumor.  I am hopeful this can be removed with the wedge resections  of the stomach laterally.  It is near the cardia, so I think endoscopy be helpful to  help preserve and protect the esophagogastric junction.  While is close to spleen, I doubt splenectomy be needed.  It does touch the diaphragm.  I do note a risk that we may have to remove part of the diaphragmatic wall with a mesh patch were sutured closure.  Hopefully that is not too likely.  They have seen my partners before.  They seem comfortable with myself performing the surgery:  The anatomy & physiology of the foregut and anti-reflux mechanism was discussed.  The pathophysiology of Gastrointestinal tumors And differential diagnosis was discussed.  Natural history risks without surgery was discussed.   The patient's symptoms are not adequately controlled by medicines and other non-operative treatments.  I feel the risks of no intervention will lead to serious problems that outweigh the operative risks; therefore, I recommended surgery to Remove the mass.  Most likely involving partial gastrectomy wedge resection.  Possibility of removing part of the diaphragm with mesh repair discussed as well.  Need for a thorough workup to rule out the differential diagnosis and plan treatment was explained.  I explained laparoscopic techniques with possible need for an open approach.  I will need to remove the mass en bloc.  Therefore I will need extraction incision.  Possible hand assist port as well.  Risks such as bleeding, infection, abscess, leak, need for further treatment, heart attack, death, and other risks were discussed.   I noted a good likelihood this will help address the problem.  Goals of post-operative recovery were discussed as well.  Possibility that this will not correct all symptoms was explained.  Post-operative dysphagia, need for short-term liquid & pureed diet, possible need for medicines to help control symptoms in addition to surgery were discussed.  We will work to minimize complications.   A low fat and  liquid diet would be helpful to help shrink the left lateral sector of the liver and make surgery safer.  She is motivated to do that.  Educational handouts further explaining the pathology, treatment options, and dysphagia diet was given as well.  Questions were answered.  The patient & family expressed understanding & wished to proceed with surgery.

## 2013-05-10 ENCOUNTER — Ambulatory Visit: Admit: 2013-05-10 | Payer: BC Managed Care – PPO | Admitting: Surgery

## 2013-05-10 SURGERY — LAPAROSCOPIC PARTIAL GASTRECTOMY
Anesthesia: General

## 2013-06-03 ENCOUNTER — Encounter (INDEPENDENT_AMBULATORY_CARE_PROVIDER_SITE_OTHER): Payer: BC Managed Care – PPO | Admitting: Surgery

## 2013-07-19 ENCOUNTER — Emergency Department (HOSPITAL_COMMUNITY)
Admission: EM | Admit: 2013-07-19 | Discharge: 2013-07-19 | Disposition: A | Discharge status: Home / Self Care | Payer: BC Managed Care – PPO | Attending: Emergency Medicine | Admitting: Emergency Medicine

## 2013-07-19 ENCOUNTER — Emergency Department (HOSPITAL_COMMUNITY): Payer: BC Managed Care – PPO

## 2013-07-19 ENCOUNTER — Other Ambulatory Visit (HOSPITAL_COMMUNITY): Payer: BC Managed Care – PPO

## 2013-07-19 ENCOUNTER — Encounter (HOSPITAL_COMMUNITY): Payer: Self-pay | Admitting: Emergency Medicine

## 2013-07-19 DIAGNOSIS — N83209 Unspecified ovarian cyst, unspecified side: Secondary | ICD-10-CM | POA: Insufficient documentation

## 2013-07-19 DIAGNOSIS — C494 Malignant neoplasm of connective and soft tissue of abdomen: Secondary | ICD-10-CM | POA: Insufficient documentation

## 2013-07-19 DIAGNOSIS — D649 Anemia, unspecified: Secondary | ICD-10-CM | POA: Insufficient documentation

## 2013-07-19 DIAGNOSIS — R197 Diarrhea, unspecified: Secondary | ICD-10-CM | POA: Insufficient documentation

## 2013-07-19 DIAGNOSIS — E669 Obesity, unspecified: Secondary | ICD-10-CM | POA: Insufficient documentation

## 2013-07-19 DIAGNOSIS — Z8669 Personal history of other diseases of the nervous system and sense organs: Secondary | ICD-10-CM | POA: Insufficient documentation

## 2013-07-19 DIAGNOSIS — C49A0 Gastrointestinal stromal tumor, unspecified site: Secondary | ICD-10-CM

## 2013-07-19 DIAGNOSIS — H5702 Anisocoria: Secondary | ICD-10-CM | POA: Insufficient documentation

## 2013-07-19 DIAGNOSIS — Z8659 Personal history of other mental and behavioral disorders: Secondary | ICD-10-CM | POA: Insufficient documentation

## 2013-07-19 DIAGNOSIS — Z8719 Personal history of other diseases of the digestive system: Secondary | ICD-10-CM | POA: Insufficient documentation

## 2013-07-19 DIAGNOSIS — Z79899 Other long term (current) drug therapy: Secondary | ICD-10-CM | POA: Insufficient documentation

## 2013-07-19 DIAGNOSIS — R51 Headache: Secondary | ICD-10-CM | POA: Insufficient documentation

## 2013-07-19 LAB — URINALYSIS, ROUTINE W REFLEX MICROSCOPIC
Bilirubin Urine: NEGATIVE
Ketones, ur: NEGATIVE mg/dL
Nitrite: NEGATIVE
pH: 6 (ref 5.0–8.0)

## 2013-07-19 LAB — CBC WITH DIFFERENTIAL/PLATELET
Basophils Relative: 0 % (ref 0–1)
Eosinophils Absolute: 0.6 10*3/uL (ref 0.0–0.7)
Eosinophils Relative: 7 % — ABNORMAL HIGH (ref 0–5)
HCT: 34.6 % — ABNORMAL LOW (ref 36.0–46.0)
Hemoglobin: 11.7 g/dL — ABNORMAL LOW (ref 12.0–15.0)
Lymphs Abs: 2.6 10*3/uL (ref 0.7–4.0)
MCH: 29.8 pg (ref 26.0–34.0)
MCHC: 33.8 g/dL (ref 30.0–36.0)
MCV: 88 fL (ref 78.0–100.0)
Monocytes Absolute: 0.4 10*3/uL (ref 0.1–1.0)
Monocytes Relative: 4 % (ref 3–12)
RBC: 3.93 MIL/uL (ref 3.87–5.11)

## 2013-07-19 LAB — COMPREHENSIVE METABOLIC PANEL
Albumin: 3.2 g/dL — ABNORMAL LOW (ref 3.5–5.2)
BUN: 12 mg/dL (ref 6–23)
Calcium: 8.9 mg/dL (ref 8.4–10.5)
Creatinine, Ser: 0.63 mg/dL (ref 0.50–1.10)
Total Protein: 7.3 g/dL (ref 6.0–8.3)

## 2013-07-19 LAB — LIPASE, BLOOD: Lipase: 25 U/L (ref 11–59)

## 2013-07-19 MED ORDER — MORPHINE SULFATE 4 MG/ML IJ SOLN
4.0000 mg | Freq: Once | INTRAMUSCULAR | Status: AC
  Administered 2013-07-19: 4 mg via INTRAVENOUS
  Filled 2013-07-19: qty 1

## 2013-07-19 MED ORDER — MORPHINE SULFATE 4 MG/ML IJ SOLN
4.0000 mg | Freq: Once | INTRAMUSCULAR | Status: DC
  Filled 2013-07-19: qty 1

## 2013-07-19 MED ORDER — DIPHENHYDRAMINE HCL 50 MG/ML IJ SOLN
50.0000 mg | Freq: Once | INTRAMUSCULAR | Status: DC
  Filled 2013-07-19: qty 1

## 2013-07-19 MED ORDER — ONDANSETRON HCL 4 MG/2ML IJ SOLN
4.0000 mg | Freq: Once | INTRAMUSCULAR | Status: AC
  Administered 2013-07-19: 4 mg via INTRAVENOUS
  Filled 2013-07-19: qty 2

## 2013-07-19 MED ORDER — SODIUM CHLORIDE 0.9 % IV BOLUS (SEPSIS)
1000.0000 mL | Freq: Once | INTRAVENOUS | Status: AC
  Administered 2013-07-19: 1000 mL via INTRAVENOUS

## 2013-07-19 MED ORDER — METHYLPREDNISOLONE SODIUM SUCC 125 MG IJ SOLR
125.0000 mg | Freq: Once | INTRAMUSCULAR | Status: DC
  Filled 2013-07-19: qty 2

## 2013-07-19 NOTE — ED Provider Notes (Signed)
CSN: [id number]     Arrival date & time 07/19/13  1414 History   First MD Initiated Contact with Patient 07/19/13 1502     Chief Complaint  Patient presents with  . Flank Pain   (Consider location/radiation/quality/duration/timing/severity/associated sxs/prior Treatment) HPI Comments: Patient is a 42 year old female with history of stromal tumor, fibroids, depression, seasonal allergies, shortness of breath, anemia, headache, GERD, restless leg syndrome, anxiety who presents today with flank pain since yesterday. The pain is crampy in nature. It begins in her low back and radiates around her sides into both her right and left lower quadrant. She has been followed by surgery for her stromal tumor diagnosed just after her hysterectomy in April. She denies any fevers, nausea, vomiting. She denies any worsening of her diarrhea. She is on Gleevec which gives her diarrhea. No blood in her stool. Last bowel movement was this morning. Nothing makes her pain better. Palpation makes her pain worse. She also notes that her right pupil has been dilated for the past 3 weeks. This was noticed incidentally by her oncologist. She was then sent to her opthomologist. She reports that no one can tell her what is wrong. She denies any visual disturbance, photophobia, pain with EOMs. She has headaches. These headaches have been occuring in the same pattern for approximately 1 year. They occur about 2x a week and wake her from sleep. They last 2 hours and resolve without intervention.    The history is provided by the patient. No language interpreter was used.    Past Medical History  Diagnosis Date  . Fibroid   . Depression   . Seasonal allergies   . Shortness of breath     On exhertion,recently,with anemia per pt.  . Anemia   . Headache(784.0)   . GERD (gastroesophageal reflux disease)     Takes Tums prn.  . Restless leg syndrome   . Anxiety     Last visit to [facility] 2008 gave birth to stillborn, pt teary today  discussing   Past Surgical History  Procedure Laterality Date  . Cholecystectomy    . Cesarean section    . Abdominal hysterectomy N/A 02/13/2013    Procedure: HYSTERECTOMY ABDOMINAL WITH BILATERAL SALPINGECTOMY WITH EXTENSIVE LYSIS OF ADHESIONS;  Surgeon: Genia Del, MD;  Location: [facility];  Service: Gynecology;  Laterality: N/A;   Family History  Problem Relation Age of Onset  . Cancer Mother     Bladder cancer  . Hypertension Mother   . COPD Mother   . Hypertension Sister   . Heart attack Maternal Grandfather    History  Substance Use Topics  . Smoking status: Never Smoker   . Smokeless tobacco: Never Used  . Alcohol Use: Yes     Comment: rare   OB History   Grav Para Term Preterm Abortions TAB SAB Ect Mult Living   2 2 1 1      1      Review of Systems  Constitutional: Negative for fever and chills.  Eyes: Negative for photophobia, pain, discharge, redness, itching and visual disturbance.  Respiratory: Negative for shortness of breath.   Cardiovascular: Negative for chest pain.  Gastrointestinal: Positive for diarrhea. Negative for nausea and vomiting.  Genitourinary: Positive for flank pain.  Neurological: Positive for headaches.  All other systems reviewed and are negative.    Allergies  Ivp dye and Shellfish allergy  Home Medications   Current Outpatient Rx  Name  Route  Sig  Dispense  Refill  .  cetirizine (ZYRTEC) 10 MG tablet   Oral   Take 20 mg by mouth at bedtime.          . diphenhydrAMINE (BENADRYL) 25 MG tablet   Oral   Take 50 mg by mouth at bedtime as needed for itching.          . ferrous sulfate 325 (65 FE) MG tablet   Oral   Take 650 mg by mouth daily with breakfast.         . ibuprofen (ADVIL,MOTRIN) 200 MG tablet   Oral   Take 200 mg by mouth every 6 (six) hours as needed for pain.         Marland Kitchen imatinib (GLEEVEC) 400 MG tablet   Oral   Take 400 mg by mouth daily. Take with meals and large glass of  water.Caution:Chemotherapy.         . Multiple Vitamin (MULTIVITAMIN WITH MINERALS) TABS   Oral   Take 1 tablet by mouth daily.          BP 139/93  Temp(Src) 98.5 F (36.9 C) (Oral)  Resp 20  Wt 359 lb (162.841 kg)  BMI 63.61 kg/m2  SpO2 99%  LMP 01/10/2013 Physical Exam  Nursing note and vitals reviewed. Constitutional: She is oriented to person, place, and time. She appears well-developed and well-nourished. No distress.  obese  HENT:  Head: Normocephalic and atraumatic.  Right Ear: External ear normal.  Left Ear: External ear normal.  Nose: Nose normal.  Mouth/Throat: Oropharynx is clear and moist.  Eyes: Conjunctivae and EOM are normal. Pupils are unequal.  Right pupil dilated, mildly reactive to both direct and consensual light.  Left pupil is briskly reactive.   Neck: Normal range of motion.  Cardiovascular: Normal rate, regular rhythm and normal heart sounds.   Pulmonary/Chest: Effort normal and breath sounds normal. No stridor. No respiratory distress. She has no wheezes. She has no rales.  Abdominal: Soft. She exhibits no distension. There is generalized tenderness. There is no rigidity, no rebound and no guarding.  Musculoskeletal: Normal range of motion.  Neurological: She is alert and oriented to person, place, and time. She has normal strength.  Skin: Skin is warm and dry. She is not diaphoretic. No erythema.  Psychiatric: She has a normal mood and affect. Her behavior is normal.    ED Course  Procedures (including critical care time) Labs Review Labs Reviewed  CBC WITH DIFFERENTIAL - Abnormal; Notable for the following:    Hemoglobin 11.7 (*)    HCT 34.6 (*)    RDW 16.1 (*)    Eosinophils Relative 7 (*)    All other components within normal limits  COMPREHENSIVE METABOLIC PANEL - Abnormal; Notable for the following:    Albumin 3.2 (*)    Total Bilirubin 0.2 (*)    All other components within normal limits  URINALYSIS, ROUTINE W REFLEX MICROSCOPIC   LIPASE, BLOOD   Imaging Review Ct Abdomen Pelvis Wo Contrast  07/19/2013   CLINICAL DATA:  Abdominal pain.  GI stromal tumor.  IV dye allergy.  EXAM: CT ABDOMEN AND PELVIS WITHOUT CONTRAST  TECHNIQUE: Multidetector CT imaging of the abdomen and pelvis was performed following the standard protocol without intravenous contrast.  COMPARISON:  CT 03/16/2013  FINDINGS: Lung bases are clear. Non IV contrast images demonstrate no focal hepatic lesion. Patient status post cholecystectomy. The pancreas, spleen, adrenal glands, and kidneys are normal.  Stomach is normal. Superior to the gastric fundus there is a rounded mass measuring  3.9 x 5.0 cm decreases in size from 6.0 x 6.5 cm on prior. The stomach, small bowel, appendix, cecum normal. The colon and rectosigmoid colon are normal.  Abdominal aorta is normal caliber. No retroperitoneal periportal lymphadenopathy. Small left periaortic lymph nodes are less than 10 mm.  There is interval decrease in volume of the seroma within the ventral subcutaneous tissues midline with now only thickening remaining.  Again demonstrated enlargement of the left adnexa is a 5.7 x 3.1 cm similar to a 4.7 x 3.0 cm on prior. The bladder is normal. Post hysterectomy anatomy. No pelvic abscess evident.  IMPRESSION: 1. Lobular mass superior to the gastric fundus is decreased in size compared to prior.  2. Left adnexal lobular lesion is increased slightly in size compared to prior. This likely represents cystic change of left ovary.  3. Interval decrease in volume of the a ventral subcutaneous seroma in the upper pelvis.   Electronically Signed   By: Genevive Bi M.D.   On: 07/19/2013 17:39   Ct Head Wo Contrast  07/19/2013   CLINICAL DATA:  Headache and dilated right pupil.  EXAM: CT HEAD WITHOUT CONTRAST  TECHNIQUE: Contiguous axial images were obtained from the base of the skull through the vertex without intravenous contrast.  COMPARISON:  None.  FINDINGS: The brain demonstrates  no evidence of hemorrhage, infarction, edema, mass effect, extra-axial fluid collection, hydrocephalus or mass lesion. The skull is unremarkable. Mucosal thickening is present in bilateral ethmoid air cells.  IMPRESSION: No acute findings by head CT.   Electronically Signed   By: Irish Lack M.D.   On: 07/19/2013 18:23    MDM   1. Malignant GIST (gastrointestinal stromal tumor)   2. Anisocoria   3. Ovarian cyst    Patient presents to emergency department with chief complaint of flank pain. Hx of stromal tumor. Pt has been taking Gleevec which has reduced the size of her tumor seen on CT scan. Cystic change of left ovary possibly responsible for today's pain. Patient also mentions her right pupil has been dialated for the past 3 weeks. A head CT was done which was unremarkable. Discussed this with neurology. Dr. Roseanne Reno believes patient is safe to be referred to [facility] for outpatient MRI. Patient will keep her appointments at [facility] for her GIST. Return instructions given. Dr. Fonnie Jarvis evaluated patient and agrees with plan. Vital signs stable for discharge.. Patient / Family / Caregiver informed of clinical course, understand medical decision-making process, and agree with plan.   8:16 PM Discussed case with Dr. Roseanne Reno from neurology who recommends referral to [facility] neurology. [place] can then refer to open MRI.   Mora Bellman, PA-C 07/20/13 1236

## 2013-07-19 NOTE — ED Notes (Signed)
Patient with bilateral flank and pelvic pain worse with urination.  Patient diagnosed with stomach tumor and is being treated with a chemo drug at [facility].  Patient rates her pain as a 5-6.  Patient has nause but she reports that is from the chemo.  No vomiting.

## 2013-07-19 NOTE — ED Notes (Signed)
MRI called to say patient too large for their table.  PA notified.

## 2013-07-19 NOTE — ED Notes (Signed)
Patient returned from CT

## 2013-07-19 NOTE — ED Notes (Signed)
Patient transported to CT 

## 2013-07-19 NOTE — ED Notes (Signed)
Pt.refused scheduled pain med. Morphine 4mg  for  Now, stating that " pain is not that bad now and i'm ok".

## 2013-07-20 NOTE — ED Provider Notes (Signed)
Medical screening examination/treatment/procedure(s) were conducted as a shared visit with non-physician practitioner(s) and myself.  I personally evaluated the patient during the encounter  Pt with over one year of twice weekly headaches last several hours each, no associated focal neuro Sxs, no change in headache pattern, incidentally noted anisocoria by Pt's oncologist and also seen by eye doctor weeks ago, on exam Pt with anisocoria with dilated right pupil, but still has some constriction to direct and consensual light and left eye also constricts to both direct and consensual light, EOMI, peripheral visual fields full to confrontation. No sudden headache, doubt SAH or acute CVA, Pt too heavy for MRI/MRA, feel OutPt f/u reasonable.  Hurman Horn, MD 07/20/13 [phone]

## 2013-07-24 ENCOUNTER — Ambulatory Visit (INDEPENDENT_AMBULATORY_CARE_PROVIDER_SITE_OTHER): Payer: BC Managed Care – PPO | Admitting: Neurology

## 2013-07-24 ENCOUNTER — Encounter: Payer: Self-pay | Admitting: Neurology

## 2013-07-24 VITALS — BP 120/80 | HR 80 | Temp 98.1°F | Resp 18 | Wt 361.4 lb

## 2013-07-24 DIAGNOSIS — R51 Headache: Secondary | ICD-10-CM

## 2013-07-24 DIAGNOSIS — R200 Anesthesia of skin: Secondary | ICD-10-CM

## 2013-07-24 DIAGNOSIS — R209 Unspecified disturbances of skin sensation: Secondary | ICD-10-CM

## 2013-07-24 DIAGNOSIS — H5704 Mydriasis: Secondary | ICD-10-CM

## 2013-07-24 NOTE — Progress Notes (Addendum)
[facility] Neurology Division Clinic Note - Initial Visit   Date: 07/24/2013   Stacy Hill MRN: [id number] DOB: [birth year]   Dear Dr Allena Katz:  Thank you for your kind referral of Stacy Hill for consultation of right dilated pupil. Although her history is well known to you, please allow [place] to reiterate it for the purpose of our medical record. The patient was accompanied to the clinic by her cousin.   History of Present Illness: Stacy Hill is a 42 y.o. year-old right-handed African American female with history of stromal tumor, fibroids, depression, seasonal allergies, shortness of breath, anemia, headache, GERD, restless leg syndrome, and anxiety  presenting for evaluation of headaches and right dilated pupil.    Around the end of August, she went to [facility] to be evaluated by an oncologist and on routine examination, he noticed that her right pupil was dilated.  She saw an optometrist around October 1st who did not find any explanation for her dilated pupil and that the eye, otherwise looked okay.  She does not apply any eye drops.  She endorses blurry vision which is not corrected by her glasses.  She has numbness/tingling involving her left lower leg and left hand, which only occurs when she is at rest.  Denies any trauma or recent infection.  Over the past year, she started noticing the headaches over the back of her head, which wakes her up from sleeping. Headache is located at the back of her head, non-radiating, and characterized as throbbing/pounding.  They occur 1-2x/week, lasting an hour.  Her headaches usually happen at night and wakes her up from sleeping.  She cries, prays, takes aspirin, and holds her head.  During the day, she is mostly well.  Associated with the headaches, she gets intermittent tingling over the right side of her face.  Denies nausea, vomiting, photophobia, phonophobia, or neck pain.  No alleviating or exacerbating  factors.  She denies any eye pain, color desaturation, pain with eye movement, double vision, dysphagia, dysarthria, or weakness.  Out-side paper records, electronic medical record, and images have been reviewed where available and summarized as:  CT head wo contrast 07/19/2013: The brain demonstrates no evidence of hemorrhage, infarction, edema, mass effect, extra-axial fluid collection, hydrocephalus or mass lesion. The skull is unremarkable. Mucosal thickening is present in  bilateral ethmoid air cells.  IMPRESSION:  No acute findings by head CT.  Past Medical History  Diagnosis Date  . Fibroid   . Depression   . Seasonal allergies   . Shortness of breath     On exhertion,recently,with anemia per pt.  . Anemia   . Headache(784.0)   . GERD (gastroesophageal reflux disease)     Takes Tums prn.  . Restless leg syndrome   . Anxiety     Last visit to [facility] 2008 gave birth to stillborn, pt teary today discussing    Past Surgical History  Procedure Laterality Date  . Cholecystectomy    . Cesarean section    . Abdominal hysterectomy N/A 02/13/2013    Procedure: HYSTERECTOMY ABDOMINAL WITH BILATERAL SALPINGECTOMY WITH EXTENSIVE LYSIS OF ADHESIONS;  Surgeon: Genia Del, MD;  Location: [facility];  Service: Gynecology;  Laterality: N/A;     Medications:  Current Outpatient Prescriptions on File Prior to Visit  Medication Sig Dispense Refill  . cetirizine (ZYRTEC) 10 MG tablet Take 20 mg by mouth at bedtime.       . diphenhydrAMINE (BENADRYL) 25 MG tablet Take 50 mg by mouth at bedtime  as needed for itching.       . ferrous sulfate 325 (65 FE) MG tablet Take 650 mg by mouth daily with breakfast.      . ibuprofen (ADVIL,MOTRIN) 200 MG tablet Take 200 mg by mouth every 6 (six) hours as needed for pain.      Marland Kitchen imatinib (GLEEVEC) 400 MG tablet Take 400 mg by mouth daily. Take with meals and large glass of water.Caution:Chemotherapy.      . Multiple Vitamin (MULTIVITAMIN WITH MINERALS)  TABS Take 1 tablet by mouth daily.       No current facility-administered medications on file prior to visit.    Allergies:  Allergies  Allergen Reactions  . Ivp Dye [Iodinated Diagnostic Agents] Hives  . Shellfish Allergy Hives    Family History: Family History  Problem Relation Age of Onset  . Cancer Mother     Bladder cancer  . Hypertension Mother   . COPD Mother   . Hypertension Sister   . Heart attack Maternal Grandfather     Social History: History   Social History  . Marital Status: Single    Spouse Name: N/A    Number of Children: N/A  . Years of Education: N/A   Occupational History  . Not on file.   Social History Main Topics  . Smoking status: Never Smoker   . Smokeless tobacco: Never Used  . Alcohol Use: Yes     Comment: rare  . Drug Use: No  . Sexual Activity: No   Other Topics Concern  . Not on file   Social History Narrative  . No narrative on file    Review of Systems:  CONSTITUTIONAL: No fevers, chills, night sweats, or weight loss.   EYES: + visual changes, or eye pain ENT: No hearing changes.  No history of nose bleeds.   RESPIRATORY: No cough, wheezing and shortness of breath.   CARDIOVASCULAR: Negative for chest pain, and palpitations.   GI: Negative for abdominal discomfort, blood in stools or black stools.  No recent change in bowel habits.   GU:  No history of incontinence.   MUSCLOSKELETAL: No history of joint pain or swelling.  No myalgias.   SKIN: Negative for lesions, rash, and itching.   HEMATOLOGY/ONCOLOGY: Negative for prolonged bleeding, bruising easily, and swollen nodes.   ENDOCRINE: Negative for cold or heat intolerance, polydipsia or goiter.   PSYCH:  +depression or anxiety symptoms.   NEURO: As Above.   Vital Signs:  BP 120/80  Pulse 80  Temp(Src) 98.1 F (36.7 C)  Resp 18  Wt 361 lb 6.4 oz (163.93 kg)  BMI 64.04 kg/m2  LMP 01/10/2013   General Medical Exam:   General:  Obese, well appearing,  comfortable.   Eyes/ENT: see cranial nerve examination.   Neck: No masses appreciated.  Full range of motion without tenderness.  No carotid bruits. Respiratory:  Clear to auscultation, good air entry bilaterally.   Cardiac:  Regular rate and rhythm, no murmur.   Extremities:  No deformities, edema, or skin discoloration. Good capillary refill.   Skin:  Skin color, texture, turgor normal. No rashes or lesions.  Neurological Exam: MENTAL STATUS including orientation to time, place, person, recent and remote memory, attention span and concentration, language, and fund of knowledge is normal.  Speech is not dysarthric.  CRANIAL NERVES: II:  No visual field defects.  Unremarkable fundi.   III-IV-VI: Right pupil is dilated 3.29mm and sluggish to light, but is reactive to light from 3.5 >  3mm, left pupil is 2.96mm and briskly reactive to light > 1.33mm.  There is no irregularity of the iris or abnormal pupillary movement.   There is subtle OD constriction to accomodation.  Normal conjugate, extra-ocular eye movements in all directions of gaze.  No nystagmus.  No ptosis prior to or post sustained upgaze.  Ciliospinal reflex is negative on the left.  V:  Normal facial sensation.   VII:  Normal facial symmetry and movements.   VIII:  Normal hearing and vestibular function.   IX-X:  Normal palatal movement.   XI:  Normal shoulder shrug and head rotation.   XII:  Normal tongue strength and range of motion, no deviation or fasciculation.  MOTOR:  No atrophy, fasciculations or abnormal movements.  No pronator drift.  Tone is normal.    Right Upper Extremity:    Left Upper Extremity:    Deltoid  5/5   Deltoid  5/5   Biceps  5/5   Biceps  5/5   Triceps  5/5   Triceps  5/5   Wrist extensors  5/5   Wrist extensors  5/5   Wrist flexors  5/5   Wrist flexors  5/5   Finger extensors  5/5   Finger extensors  5/5   Finger flexors  5/5   Finger flexors  5/5   Dorsal interossei  5/5   Dorsal interossei  5/5    Abductor pollicis  5/5   Abductor pollicis  5/5   Tone (Ashworth scale)  0  Tone (Ashworth scale)  0   Right Lower Extremity:    Left Lower Extremity:    Hip flexors  5/5   Hip flexors  5/5   Hip extensors  5/5   Hip extensors  5/5   Knee flexors  5/5   Knee flexors  5/5   Knee extensors  5/5   Knee extensors  5/5   Dorsiflexors  5/5   Dorsiflexors  5/5   Plantarflexors  5/5   Plantarflexors  5/5   Toe extensors  5/5   Toe extensors  5/5   Toe flexors  5/5   Toe flexors  5/5   Tone (Ashworth scale)  0  Tone (Ashworth scale)  0   MSRs:  Right                                                                 Left brachioradialis 2+  brachioradialis 2+  biceps 2+  biceps 2+  triceps 2+  triceps 2+  patellar trace  patellar trace  ankle jerk 0  ankle jerk 0  Hoffman no  Hoffman no  plantar response mute  plantar response mute   SENSORY:  Normal and symmetric perception of light touch, pinprick, vibration, and proprioception.  Romberg's sign absent.   COORDINATION/GAIT: Normal finger-to- nose-finger and heel-to-shin.  Intact rapid alternating movements bilaterally.  Able to rise from a chair without using arms.  Gait narrow based and stable. Tandem and stressed gait intact.    IMPRESSION: Stacy Hill is a 42 year-old female with history of stromal tumor on Gleevac presenting for evaluation of nocturnal headaches and right dilated pupil.  Her neurological examination reveals anisocoria with OD 3.74mm and sluggish and OS 2.22mm and briskly reactive.  There is  no associated ptosis, ophthalmoplegia, facial paresthesias, or facial weakness.  Her reflexes in her legs are depressed bilaterally, but tone and motor strength is normal.  Given her recent onset of severe headaches that wake her up from sleeping and pupillary findings, I would like to obtain MRI brain and MRA brain and carotids to look for structural lesions such as aneurysms, dissection, etc.  If this is unrevealing, I would like for her  to see neuro-ophthalmology for evaluation.   PLAN/RECOMMENDATIONS:  1.  MRI brain wo contrast - patient has gadolinium allergy 2.  MRA circle of willis and carotids 3.  Consider neuro-opthamology referral pending imaging results 4.  Patient instructed to go to ED if she develops worst headache of her life or new neurological symptoms 5.  Return to clinic in 3-4 weeks   The duration of this appointment visit was 60 minutes of face-to-face time with the patient.  Greater than 50% of this time was spent in counseling, explanation of diagnosis, planning of further management, and coordination of care.   Thank you for allowing me to participate in patient's care.  If I can answer any additional questions, I would be pleased to do so.    Sincerely,    Bartosz Luginbill K. Amory Simonetti, DO  ----------------------------------------------- ADDENDUM  MRI and vessel imaging is normal. 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  I would like to get the opinion of neuro-ophthalmology at [facility].  Stacy Hill K. Allena Katz, DO 08/02/2013

## 2013-07-31 ENCOUNTER — Ambulatory Visit
Admission: RE | Admit: 2013-07-31 | Discharge: 2013-07-31 | Disposition: A | Discharge status: Home / Self Care | Payer: BC Managed Care – PPO | Source: Ambulatory Visit | Attending: Neurology | Admitting: Neurology

## 2013-07-31 DIAGNOSIS — H5704 Mydriasis: Secondary | ICD-10-CM

## 2013-07-31 DIAGNOSIS — R200 Anesthesia of skin: Secondary | ICD-10-CM

## 2013-08-01 ENCOUNTER — Telehealth: Payer: Self-pay | Admitting: Neurology

## 2013-08-02 ENCOUNTER — Telehealth: Payer: Self-pay | Admitting: Neurology

## 2013-08-02 ENCOUNTER — Other Ambulatory Visit: Payer: Self-pay

## 2013-08-02 ENCOUNTER — Telehealth: Payer: Self-pay

## 2013-08-02 NOTE — Telephone Encounter (Signed)
I called [facility] and left a message for them to call me back so we can schedule an appointment for pt to be seen soon.

## 2013-08-02 NOTE — Telephone Encounter (Signed)
Late entry  Called patient with results of MRI, but there was no answer.

## 2013-08-02 NOTE — Telephone Encounter (Signed)
Called patient to discuss results of MRI and vessel imaging which was normal. 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  I would like to get the opinion of neuro-ophthalmology at [facility] for further evaluation.   Donika K. Allena Katz, DO

## 2013-08-08 ENCOUNTER — Telehealth: Payer: Self-pay

## 2013-08-08 NOTE — Telephone Encounter (Signed)
Called pt to inform her of her appt at [facility]. She is aware of all details concerning that appt.

## 2013-08-08 NOTE — Telephone Encounter (Signed)
Made appointment for pt to see neuro-opthalmologist on January 8th at 8:15am at [facility].

## 2013-08-08 NOTE — Telephone Encounter (Signed)
I called [facility] and the earliest they can see her is January 8th unless you specify a time frame you want her seen in. Then they could try to get her in earlier. When do you want her seen?

## 2013-08-08 NOTE — Telephone Encounter (Signed)
Sometime in December would be better, if available, otherwise take Jan. 8th.  Please notify patient once it is scheduled.  Thanks.

## 2013-08-08 NOTE — Telephone Encounter (Signed)
Message copied by Lou Cal on Thu Aug 08, 2013  1:37 PM ------      Message from: Nita Sickle K      Created: Thu Aug 08, 2013  9:06 AM      Regarding: FW: Neuro-Opthalmologist referral       Here's her info.  Thanks!            ----- Message -----         From: Lou Cal, LPN         Sent: 07/24/2013   2:27 PM           To: Glendale Chard, MD      Subject: Neuro-Opthalmologist referral                            I talked with the [facility] and they said they can not schedule with their doctor until they receive the results of the MRI/MRA. As soon as those results are in, the doctor will look them over and decide if they need to get her in sooner than December.       ------

## 2013-08-23 ENCOUNTER — Encounter: Payer: Self-pay | Admitting: Neurology

## 2013-08-23 ENCOUNTER — Ambulatory Visit: Payer: BC Managed Care – PPO | Admitting: Neurology

## 2013-09-26 ENCOUNTER — Encounter (HOSPITAL_COMMUNITY): Payer: Self-pay | Admitting: Emergency Medicine

## 2013-09-26 ENCOUNTER — Emergency Department (HOSPITAL_COMMUNITY): Payer: BC Managed Care – PPO

## 2013-09-26 ENCOUNTER — Emergency Department (HOSPITAL_COMMUNITY)
Admission: EM | Admit: 2013-09-26 | Discharge: 2013-09-26 | Disposition: A | Discharge status: Home / Self Care | Payer: BC Managed Care – PPO | Attending: Emergency Medicine | Admitting: Emergency Medicine

## 2013-09-26 DIAGNOSIS — F3289 Other specified depressive episodes: Secondary | ICD-10-CM | POA: Insufficient documentation

## 2013-09-26 DIAGNOSIS — F411 Generalized anxiety disorder: Secondary | ICD-10-CM | POA: Insufficient documentation

## 2013-09-26 DIAGNOSIS — Z8669 Personal history of other diseases of the nervous system and sense organs: Secondary | ICD-10-CM | POA: Insufficient documentation

## 2013-09-26 DIAGNOSIS — J069 Acute upper respiratory infection, unspecified: Secondary | ICD-10-CM | POA: Insufficient documentation

## 2013-09-26 DIAGNOSIS — D649 Anemia, unspecified: Secondary | ICD-10-CM | POA: Insufficient documentation

## 2013-09-26 DIAGNOSIS — Z79899 Other long term (current) drug therapy: Secondary | ICD-10-CM | POA: Insufficient documentation

## 2013-09-26 DIAGNOSIS — Z85028 Personal history of other malignant neoplasm of stomach: Secondary | ICD-10-CM | POA: Insufficient documentation

## 2013-09-26 DIAGNOSIS — F329 Major depressive disorder, single episode, unspecified: Secondary | ICD-10-CM | POA: Insufficient documentation

## 2013-09-26 DIAGNOSIS — Z8742 Personal history of other diseases of the female genital tract: Secondary | ICD-10-CM | POA: Insufficient documentation

## 2013-09-26 DIAGNOSIS — Z792 Long term (current) use of antibiotics: Secondary | ICD-10-CM | POA: Insufficient documentation

## 2013-09-26 HISTORY — DX: Malignant (primary) neoplasm, unspecified: C80.1

## 2013-09-26 LAB — CBC WITH DIFFERENTIAL/PLATELET
Basophils Absolute: 0 10*3/uL (ref 0.0–0.1)
Basophils Relative: 0 % (ref 0–1)
Eosinophils Relative: 13 % — ABNORMAL HIGH (ref 0–5)
HCT: 38.2 % (ref 36.0–46.0)
Hemoglobin: 12.8 g/dL (ref 12.0–15.0)
Lymphocytes Relative: 31 % (ref 12–46)
MCV: 92.7 fL (ref 78.0–100.0)
Monocytes Absolute: 0.3 10*3/uL (ref 0.1–1.0)
Monocytes Relative: 5 % (ref 3–12)
Neutro Abs: 2.6 10*3/uL (ref 1.7–7.7)
RBC: 4.12 MIL/uL (ref 3.87–5.11)
RDW: 13.7 % (ref 11.5–15.5)
WBC: 5.1 10*3/uL (ref 4.0–10.5)

## 2013-09-26 MED ORDER — AZITHROMYCIN 250 MG PO TABS: 250.0000 mg | ORAL_TABLET | Freq: Every day | ORAL | Status: DC

## 2013-09-26 NOTE — ED Provider Notes (Signed)
CSN: [id number]     Arrival date & time 09/26/13  0910 History  This chart was scribed for non-physician practitioner,Toshiko Kemler Neva Seat, PA-C working with Enid Skeens, MD by Luisa Dago, ED scribe. This patient was seen in room TR06C/TR06C and the patient's care was started at 10:00 AM.    Chief Complaint  Patient presents with  . URI    The history is provided by the patient. No language interpreter was used.   HPI Comments: Emmaclaire Switala is a 42 y.o. female who presents to the Emergency Department complaining of congestion and sore throat 4 days ago. Pt states that she has been undergoing oral chemotherapy for the past five months, and has been getting sick frequently with colds since the chemotherapy. Pt gets a CBC every three moths, the next one will be in January. Pt denies having a fever, and cough, weakness, confusion or any other associated symptoms. Says she is tolerating her chemo therapy well.    Past Medical History  Diagnosis Date  . Fibroid   . Depression   . Seasonal allergies   . Shortness of breath     On exhertion,recently,with anemia per pt.  . Anemia   . Headache(784.0)   . GERD (gastroesophageal reflux disease)     Takes Tums prn.  . Restless leg syndrome   . Anxiety     Last visit to [facility] 2008 gave birth to stillborn, pt teary today discussing  . Stromal tumor of digestive system   . Cancer    Past Surgical History  Procedure Laterality Date  . Cholecystectomy    . Cesarean section    . Abdominal hysterectomy N/A 02/13/2013    Procedure: HYSTERECTOMY ABDOMINAL WITH BILATERAL SALPINGECTOMY WITH EXTENSIVE LYSIS OF ADHESIONS;  Surgeon: Genia Del, MD;  Location: [facility];  Service: Gynecology;  Laterality: N/A;   Family History  Problem Relation Age of Onset  . Cancer Mother     Bladder cancer  . Hypertension Mother     Living, 45  . COPD Mother   . Hypertension Sister   . Heart attack Maternal Grandfather   . Other Father     Died, 80s    History  Substance Use Topics  . Smoking status: Never Smoker   . Smokeless tobacco: Never Used  . Alcohol Use: Yes     Comment: rare   OB History   Grav Para Term Preterm Abortions TAB SAB Ect Mult Living   2 2 1 1      1      Review of Systems  Constitutional: Negative for fever.  HENT: Positive for congestion and sore throat.   Respiratory: Negative for cough.   All other systems reviewed and are negative.    Allergies  Ivp dye and Shellfish allergy  Home Medications   Current Outpatient Rx  Name  Route  Sig  Dispense  Refill  . b complex vitamins tablet   Oral   Take 2 tablets by mouth daily.         . cetirizine (ZYRTEC) 10 MG tablet   Oral   Take 20 mg by mouth daily as needed (for itching).          . diphenhydrAMINE (BENADRYL) 25 MG tablet   Oral   Take 50 mg by mouth at bedtime as needed for itching.          . ferrous sulfate 325 (65 FE) MG tablet   Oral   Take 650 mg by mouth daily  with breakfast.         . FLUoxetine (PROZAC) 10 MG capsule   Oral   Take 30 mg by mouth daily.          Marland Kitchen imatinib (GLEEVEC) 400 MG tablet   Oral   Take 400 mg by mouth daily. Take with meals and large glass of water.Caution:Chemotherapy.         . Multiple Vitamin (MULTIVITAMIN WITH MINERALS) TABS   Oral   Take 1 tablet by mouth daily.         . Pseudoeph-Doxylamine-DM-APAP (NYQUIL PO)   Oral   Take 1 Units by mouth every 4 (four) hours as needed (for cold symptoms).         Marland Kitchen azithromycin (ZITHROMAX) 250 MG tablet   Oral   Take 1 tablet (250 mg total) by mouth daily. Take first 2 tablets together, then 1 every day until finished.   6 tablet   0    BP 122/74  Pulse 89  Temp(Src) 98 F (36.7 C) (Oral)  Resp 22  Ht 5\' 2"  (1.575 m)  Wt 360 lb (163.295 kg)  BMI 65.83 kg/m2  SpO2 97%  LMP 01/10/2013 Physical Exam  Nursing note and vitals reviewed. Constitutional: She is oriented to person, place, and time. She appears well-developed  and well-nourished. No distress.  HENT:  Head: Normocephalic and atraumatic.  Right Ear: Tympanic membrane and ear canal normal.  Left Ear: Tympanic membrane and ear canal normal.  Mouth/Throat: Oropharynx is clear and moist. No oropharyngeal exudate, posterior oropharyngeal edema or posterior oropharyngeal erythema.  Nasal congestion.  Eyes: EOM are normal.  Neck: Neck supple. No tracheal deviation present.  Cardiovascular: Normal rate and regular rhythm.   Pulmonary/Chest: Effort normal and breath sounds normal. No respiratory distress. She has no wheezes. She has no rhonchi. She has no rales.  Musculoskeletal: Normal range of motion.  Neurological: She is alert and oriented to person, place, and time.  Skin: Skin is warm and dry.  Psychiatric: She has a normal mood and affect. Her behavior is normal.    ED Course  Procedures (including critical care time)  DIAGNOSTIC STUDIES: Oxygen Saturation is 97% on room air, normal by my interpretation.    COORDINATION OF CARE: 10:04 AM-Discussed treatment plan which includes CBC with pt at bedside and pt agreed to plan. Will start pt on antibiotics if CBC is normal.  Labs Review Labs Reviewed  CBC WITH DIFFERENTIAL - Abnormal; Notable for the following:    Eosinophils Relative 13 (*)    All other components within normal limits   Imaging Review Dg Chest 2 View  09/26/2013   CLINICAL DATA:  Cough and congestion  EXAM: CHEST  2 VIEW  COMPARISON:  None.  FINDINGS: Lungs are clear. Heart size and pulmonary vascularity are normal. No adenopathy. No bone lesions.  IMPRESSION: No abnormality noted.   Electronically Signed   By: Bretta Bang M.D.   On: 09/26/2013 09:47    EKG Interpretation    Date/Time:  Thursday September 26 2013 09:14:36 EST Ventricular Rate:  89 PR Interval:  146 QRS Duration: 80 QT Interval:  374 QTC Calculation: 455 R Axis:   34 Text Interpretation:  Normal sinus rhythm Normal ECG Confirmed by ZAVITZ  MD,  JOSHUA ([id number]) on 09/26/2013 9:20:51 AM            MDM   1. URI (upper respiratory infection)    Pt looks well. Non toxic and afebrile. CHecked CBC to  confirm cell lines are not down. Normal CBC, will Rx Azithromycin and advised patient to cal her oncologist today where is is treated at [facility] and let them know of her visit to the ER today and prescribed treatment.  42 y.o.Sianna Rhyner's evaluation in the Emergency Department is complete. It has been determined that no acute conditions requiring further emergency intervention are present at this time. The patient/guardian have been advised of the diagnosis and plan. We have discussed signs and symptoms that warrant return to the ED, such as changes or worsening in symptoms.  Vital signs are stable at discharge. Filed Vitals:   09/26/13 0914  BP: 122/74  Pulse: 89  Temp: 98 F (36.7 C)  Resp: 22    Patient/guardian has voiced understanding and agreed to follow-up with the PCP or specialist.   I personally performed the services described in this documentation, which was scribed in my presence. The recorded information has been reviewed and is accurate.    Dorthula Matas, PA-C 09/26/13 1117

## 2013-09-26 NOTE — ED Notes (Signed)
Sore throat cough and congestion since Sunday pt states hurts to cough and take a deep breath

## 2013-09-26 NOTE — ED Provider Notes (Signed)
Medical screening examination/treatment/procedure(s) were performed by non-physician practitioner and as supervising physician I was immediately available for consultation/collaboration.  EKG Interpretation    Date/Time:  Thursday September 26 2013 09:14:36 EST Ventricular Rate:  89 PR Interval:  146 QRS Duration: 80 QT Interval:  374 QTC Calculation: 455 R Axis:   34 Text Interpretation:  Normal sinus rhythm Normal ECG Confirmed by Jodi Mourning  MD, Anaijah Augsburger ([id number]) on 09/26/2013 9:20:51 AM              Enid Skeens, MD 09/26/13 [phone]

## 2013-10-17 DIAGNOSIS — C801 Malignant (primary) neoplasm, unspecified: Secondary | ICD-10-CM

## 2013-10-17 HISTORY — DX: Malignant (primary) neoplasm, unspecified: C80.1

## 2013-10-24 ENCOUNTER — Telehealth: Payer: Self-pay | Admitting: Neurology

## 2013-10-24 NOTE — Telephone Encounter (Signed)
Clinic note received from [facility] neurophthalmology by Dr. Quincy Simmonds, dated 10/24/2013:  Patient noted to have right pupil does not react to light and bed to reaction to neuropathic. Findings consistent with Adie's tonic pupil, benign, idiopathic condition. No additional workup necessary. Office visit will be scanned into Epic.  Stacy K. Posey Pronto, DO

## 2014-04-24 LAB — BASIC METABOLIC PANEL: Glucose: 71 mg/dL

## 2014-04-24 LAB — LIPID PANEL
Cholesterol: 128 mg/dL (ref 0–200)
HDL: 47 mg/dL (ref 35–70)
LDL Cholesterol: 72 mg/dL
LDL/HDL RATIO: 2.7
Triglycerides: 48 mg/dL (ref 40–160)

## 2014-05-12 ENCOUNTER — Telehealth: Payer: Self-pay

## 2014-05-12 NOTE — Telephone Encounter (Signed)
Left message for call back.     PAP- 12/09/11- negative [facility]-DUE Td-DUE

## 2014-05-13 ENCOUNTER — Encounter: Payer: Self-pay | Admitting: Family Medicine

## 2014-05-13 ENCOUNTER — Ambulatory Visit (INDEPENDENT_AMBULATORY_CARE_PROVIDER_SITE_OTHER): Payer: BC Managed Care – PPO | Admitting: Family Medicine

## 2014-05-13 VITALS — BP 116/64 | HR 95 | Temp 98.4°F | Ht 62.75 in | Wt 354.0 lb

## 2014-05-13 DIAGNOSIS — F3289 Other specified depressive episodes: Secondary | ICD-10-CM

## 2014-05-13 DIAGNOSIS — F32A Depression, unspecified: Secondary | ICD-10-CM

## 2014-05-13 DIAGNOSIS — Z1231 Encounter for screening mammogram for malignant neoplasm of breast: Secondary | ICD-10-CM

## 2014-05-13 DIAGNOSIS — F329 Major depressive disorder, single episode, unspecified: Secondary | ICD-10-CM

## 2014-05-13 MED ORDER — FLUOXETINE HCL 10 MG PO CAPS: 30.0000 mg | ORAL_CAPSULE | Freq: Every day | ORAL | Status: DC

## 2014-05-13 MED ORDER — FLUOXETINE HCL 40 MG PO CAPS: 40.0000 mg | ORAL_CAPSULE | Freq: Every day | ORAL | Status: DC

## 2014-05-13 NOTE — Progress Notes (Signed)
Subjective:    Patient ID: Stacy Hill, female    DOB: [birth year], 43 y.o.   MRN: [id number]  HPI Pt here to establish.  She has a hx of GIST tumor, malignant and is seen at [facility].  She is struggling to lose weight and is finding it hard to exercise because the chemo makes her so tired.  She is unable to take mega vitamins/ b vitamins because it interacts with chemo.   Review of Systems As above  Past Medical History  Diagnosis Date  . Fibroid   . Depression   . Seasonal allergies   . Shortness of breath     On exhertion,recently,with anemia per pt.  . Anemia   . Headache(784.0)   . GERD (gastroesophageal reflux disease)     Takes Tums prn.  . Restless leg syndrome   . Anxiety     Last visit to [facility] 2008 gave birth to stillborn, pt teary today discussing  . Stromal tumor of digestive system   . Cancer   . Gastrointestinal stromal tumor (GIST)    History   Social History  . Marital Status: Single    Spouse Name: N/A    Number of Children: N/A  . Years of Education: N/A   Occupational History  . Not on file.   Social History Main Topics  . Smoking status: Never Smoker   . Smokeless tobacco: Never Used  . Alcohol Use: Yes     Comment: rare  . Drug Use: No  . Sexual Activity: No   Other Topics Concern  . Not on file   Social History Narrative   She has been on disability for stromal tumor since April 2014, previously worked at [employer].   Lives with 1 year old son.   Family History  Problem Relation Age of Onset  . Cancer Mother     Bladder cancer  . Hypertension Mother     Living, 16  . COPD Mother   . Hypertension Sister   . Heart attack Maternal Grandfather   . Other Father     Died, 35s   Allergies  Allergen Reactions  . Chocolate Flavor Hives  . Ivp Dye [Iodinated Diagnostic Agents] Hives  . Shellfish Allergy Hives  . Strawberry Hives   Current Outpatient Prescriptions  Medication Sig Dispense Refill  . albuterol  (PROAIR HFA) 108 (90 BASE) MCG/ACT inhaler Inhale 1 puff into the lungs every 6 (six) hours as needed.      . prochlorperazine (COMPAZINE) 10 MG tablet Take 1 tablet by mouth every 6 (six) hours as needed.      . cetirizine (ZYRTEC) 10 MG tablet Take 20 mg by mouth daily as needed (for itching).       . diphenhydrAMINE (BENADRYL) 25 MG tablet Take 50 mg by mouth at bedtime as needed for itching.       Marland Kitchen FLUoxetine (PROZAC) 40 MG capsule Take 1 capsule (40 mg total) by mouth daily.  90 capsule  3  . imatinib (GLEEVEC) 400 MG tablet Take 400 mg by mouth daily. Take with meals and large glass of water.Caution:Chemotherapy.      Marland Kitchen omeprazole (PRILOSEC) 20 MG capsule Take 1 capsule by mouth daily.       No current facility-administered medications for this visit.   Past Surgical History  Procedure Laterality Date  . Cholecystectomy    . Cesarean section    . Abdominal hysterectomy N/A 02/13/2013    Procedure: HYSTERECTOMY ABDOMINAL WITH BILATERAL  SALPINGECTOMY WITH EXTENSIVE LYSIS OF ADHESIONS;  Surgeon: Princess Bruins, MD;  Location: [facility];  Service: Gynecology;  Laterality: N/A;       Objective:   Physical Exam BP 116/64  Pulse 95  Temp(Src) 98.4 F (36.9 C) (Oral)  Ht 5' 2.75" (1.594 m)  Wt 354 lb (160.573 kg)  BMI 63.20 kg/m2  SpO2 97%  LMP 01/10/2013 General appearance: alert, cooperative, appears stated age, no distress and morbidly obese Lungs: clear to auscultation bilaterally Heart: S1, S2 normal Abdomen: soft, non-tender; bowel sounds normal; no masses,  no organomegaly Eyes-- R pupil dilated--  Not new per pt --- been seen by [facility], [facility] and opth       Assessment & Plan:  1. Other screening mammogram   - [facility] DIGITAL SCREENING BILATERAL; Future  2. [employer] prozac to 40 mg and let us know if that helps your energy level Offered counseling referral pt refused at this time - FLUoxetine (PROZAC) 40 MG capsule; Take 1 capsule (40 mg total) by mouth daily.   Dispense: 90 capsule; Refill: 3  3.  Fatigue-- secondary chemo--- recommended psych to help with meds.  Pt unable to take a lot of otc or herbal meds secondary to gleevec   4. Gist tumor-- per [facility]

## 2014-05-13 NOTE — Telephone Encounter (Signed)
Unable to reach patient pre visit.  

## 2014-05-13 NOTE — Patient Instructions (Signed)

## 2014-05-13 NOTE — Progress Notes (Signed)
Pre visit review using our clinic review tool, if applicable. No additional management support is needed unless otherwise documented below in the visit note. 

## 2014-05-19 ENCOUNTER — Ambulatory Visit
Admission: RE | Admit: 2014-05-19 | Discharge: 2014-05-19 | Disposition: A | Discharge status: Home / Self Care | Payer: BC Managed Care – PPO | Source: Ambulatory Visit | Attending: Family Medicine | Admitting: Family Medicine

## 2014-05-19 DIAGNOSIS — Z1231 Encounter for screening mammogram for malignant neoplasm of breast: Secondary | ICD-10-CM

## 2014-05-21 ENCOUNTER — Telehealth: Payer: Self-pay | Admitting: *Deleted

## 2014-05-21 NOTE — Telephone Encounter (Signed)
Should be 40 mg  

## 2014-05-21 NOTE — Telephone Encounter (Signed)
Called [facility] regarding the prescription dose for Fluoxetine.  They stated that they received 2 prescription on (05/13/14) for the Fluoxetine.  One prescription was for 40mg  and the other was for 10mg .  Need medication verification.//AB/CMA

## 2014-05-22 NOTE — Telephone Encounter (Signed)
Called the [facility] and informed the pharmacy that the pt should be only taking the Fluoxetine 40mg  per Dr. Etter Sjogren.  The pharmacy stated that they will delete the 10mg  prescription.  Was informed that the prior authorization did not need to be done.//AB/CMA

## 2014-07-14 ENCOUNTER — Emergency Department (HOSPITAL_BASED_OUTPATIENT_CLINIC_OR_DEPARTMENT_OTHER)
Admission: EM | Admit: 2014-07-14 | Discharge: 2014-07-14 | Disposition: A | Discharge status: Home / Self Care | Payer: BC Managed Care – PPO | Attending: Emergency Medicine | Admitting: Emergency Medicine

## 2014-07-14 ENCOUNTER — Encounter (HOSPITAL_BASED_OUTPATIENT_CLINIC_OR_DEPARTMENT_OTHER): Payer: Self-pay | Admitting: Emergency Medicine

## 2014-07-14 ENCOUNTER — Emergency Department (HOSPITAL_BASED_OUTPATIENT_CLINIC_OR_DEPARTMENT_OTHER): Payer: BC Managed Care – PPO

## 2014-07-14 DIAGNOSIS — Z8742 Personal history of other diseases of the female genital tract: Secondary | ICD-10-CM | POA: Diagnosis not present

## 2014-07-14 DIAGNOSIS — R5381 Other malaise: Secondary | ICD-10-CM | POA: Insufficient documentation

## 2014-07-14 DIAGNOSIS — K219 Gastro-esophageal reflux disease without esophagitis: Secondary | ICD-10-CM | POA: Insufficient documentation

## 2014-07-14 DIAGNOSIS — D481 Neoplasm of uncertain behavior of connective and other soft tissue: Secondary | ICD-10-CM | POA: Diagnosis not present

## 2014-07-14 DIAGNOSIS — F329 Major depressive disorder, single episode, unspecified: Secondary | ICD-10-CM | POA: Insufficient documentation

## 2014-07-14 DIAGNOSIS — J069 Acute upper respiratory infection, unspecified: Secondary | ICD-10-CM

## 2014-07-14 DIAGNOSIS — D4819 Other specified neoplasm of uncertain behavior of connective and other soft tissue: Secondary | ICD-10-CM | POA: Insufficient documentation

## 2014-07-14 DIAGNOSIS — F3289 Other specified depressive episodes: Secondary | ICD-10-CM | POA: Diagnosis not present

## 2014-07-14 DIAGNOSIS — Z862 Personal history of diseases of the blood and blood-forming organs and certain disorders involving the immune mechanism: Secondary | ICD-10-CM | POA: Diagnosis not present

## 2014-07-14 DIAGNOSIS — Z859 Personal history of malignant neoplasm, unspecified: Secondary | ICD-10-CM | POA: Insufficient documentation

## 2014-07-14 DIAGNOSIS — R51 Headache: Secondary | ICD-10-CM | POA: Diagnosis present

## 2014-07-14 DIAGNOSIS — R519 Headache, unspecified: Secondary | ICD-10-CM

## 2014-07-14 DIAGNOSIS — Z79899 Other long term (current) drug therapy: Secondary | ICD-10-CM | POA: Diagnosis not present

## 2014-07-14 DIAGNOSIS — R5383 Other fatigue: Secondary | ICD-10-CM

## 2014-07-14 DIAGNOSIS — Z8669 Personal history of other diseases of the nervous system and sense organs: Secondary | ICD-10-CM | POA: Diagnosis not present

## 2014-07-14 DIAGNOSIS — F411 Generalized anxiety disorder: Secondary | ICD-10-CM | POA: Diagnosis not present

## 2014-07-14 LAB — CBC WITH DIFFERENTIAL/PLATELET
Basophils Absolute: 0 10*3/uL (ref 0.0–0.1)
Basophils Relative: 0 % (ref 0–1)
Eosinophils Absolute: 0.7 10*3/uL (ref 0.0–0.7)
Eosinophils Relative: 9 % — ABNORMAL HIGH (ref 0–5)
HCT: 33.3 % — ABNORMAL LOW (ref 36.0–46.0)
HEMOGLOBIN: 11.1 g/dL — AB (ref 12.0–15.0)
LYMPHS ABS: 2.2 10*3/uL (ref 0.7–4.0)
LYMPHS PCT: 30 % (ref 12–46)
MCH: 30.7 pg (ref 26.0–34.0)
MCHC: 33.3 g/dL (ref 30.0–36.0)
MCV: 92.2 fL (ref 78.0–100.0)
MONO ABS: 0.4 10*3/uL (ref 0.1–1.0)
MONOS PCT: 5 % (ref 3–12)
NEUTROS ABS: 4 10*3/uL (ref 1.7–7.7)
NEUTROS PCT: 56 % (ref 43–77)
Platelets: 257 10*3/uL (ref 150–400)
RBC: 3.61 MIL/uL — AB (ref 3.87–5.11)
RDW: 14.1 % (ref 11.5–15.5)
WBC: 7.3 10*3/uL (ref 4.0–10.5)

## 2014-07-14 LAB — BASIC METABOLIC PANEL
Anion gap: 13 (ref 5–15)
BUN: 10 mg/dL (ref 6–23)
CHLORIDE: 102 meq/L (ref 96–112)
CO2: 25 meq/L (ref 19–32)
CREATININE: 0.6 mg/dL (ref 0.50–1.10)
Calcium: 9.1 mg/dL (ref 8.4–10.5)
GFR calc Af Amer: >90 mL/min (ref >90)
GFR calc non Af Amer: >90 mL/min (ref >90)
GLUCOSE: 81 mg/dL (ref 70–99)
POTASSIUM: 3.9 meq/L (ref 3.7–5.3)
Sodium: 140 mEq/L (ref 137–147)

## 2014-07-14 MED ORDER — MORPHINE SULFATE 4 MG/ML IJ SOLN
4.0000 mg | Freq: Once | INTRAMUSCULAR | Status: AC
  Administered 2014-07-14: 4 mg via INTRAVENOUS
  Filled 2014-07-14: qty 1

## 2014-07-14 MED ORDER — DIPHENHYDRAMINE HCL 50 MG/ML IJ SOLN
25.0000 mg | Freq: Once | INTRAMUSCULAR | Status: AC
  Administered 2014-07-14: 25 mg via INTRAVENOUS
  Filled 2014-07-14: qty 1

## 2014-07-14 MED ORDER — METOCLOPRAMIDE HCL 5 MG/ML IJ SOLN
10.0000 mg | Freq: Once | INTRAMUSCULAR | Status: AC
  Administered 2014-07-14: 10 mg via INTRAVENOUS
  Filled 2014-07-14: qty 2

## 2014-07-14 NOTE — ED Provider Notes (Signed)
CSN: [id number]     Arrival date & time 07/14/14  1230 History   First MD Initiated Contact with Patient 07/14/14 1256     Chief Complaint  Patient presents with  . Headache     (Consider location/radiation/quality/duration/timing/severity/associated sxs/prior Treatment) HPI Comments: Pt c/o headache for last couple of days. Pt states that she has a history of headache and this is similar but then she developed tingling  And numbness in there right fourth and fifth finger with pain that radiates up her arm she decided to come have it evaluated.she states that she is continuously on gleevac for stromal tumor. She has been taking motrin and asa without relief. She states that she did have a cold last week that hasn't resolved. Pain in head is worse with movement. Denies slurred speech, vision changes, or weakness. Pt states that she is just feeling more tired then normal  The history is provided by the patient. No language interpreter was used.    Past Medical History  Diagnosis Date  . Fibroid   . Depression   . Seasonal allergies   . Shortness of breath     On exhertion,recently,with anemia per pt.  . Anemia   . Headache(784.0)   . GERD (gastroesophageal reflux disease)     Takes Tums prn.  . Restless leg syndrome   . Anxiety     Last visit to [facility] 2008 gave birth to stillborn, pt teary today discussing  . Stromal tumor of digestive system   . Cancer   . Gastrointestinal stromal tumor (GIST)    Past Surgical History  Procedure Laterality Date  . Cholecystectomy    . Cesarean section    . Abdominal hysterectomy N/A 02/13/2013    Procedure: HYSTERECTOMY ABDOMINAL WITH BILATERAL SALPINGECTOMY WITH EXTENSIVE LYSIS OF ADHESIONS;  Surgeon: Princess Bruins, MD;  Location: [facility];  Service: Gynecology;  Laterality: N/A;   Family History  Problem Relation Age of Onset  . Cancer Mother     Bladder cancer  . Hypertension Mother     Living, 9  . COPD Mother   . Hypertension Sister    . Heart attack Maternal Grandfather   . Other Father     Died, 80s   History  Substance Use Topics  . Smoking status: Never Smoker   . Smokeless tobacco: Never Used  . Alcohol Use: Yes     Comment: rare   OB History   Grav Para Term Preterm Abortions TAB SAB Ect Mult Living   2 2 1 1      1      Review of Systems  Constitutional: Positive for fatigue.  HENT: Positive for congestion.   Respiratory: Negative.   Cardiovascular: Negative.   All other systems reviewed and are negative.     Allergies  Chocolate flavor; Ivp dye; Shellfish allergy; and Strawberry  Home Medications   Prior to Admission medications   Medication Sig Start Date End Date Taking? Authorizing Provider  albuterol (PROAIR HFA) 108 (90 BASE) MCG/ACT inhaler Inhale 1 puff into the lungs every 6 (six) hours as needed. 10/30/13   Historical Provider, MD  cetirizine (ZYRTEC) 10 MG tablet Take 20 mg by mouth daily as needed (for itching).     Historical Provider, MD  diphenhydrAMINE (BENADRYL) 25 MG tablet Take 50 mg by mouth at bedtime as needed for itching.     Historical Provider, MD  FLUoxetine (PROZAC) 40 MG capsule Take 1 capsule (40 mg total) by mouth daily. 05/13/14  Rosalita Chessman, DO  imatinib (GLEEVEC) 400 MG tablet Take 400 mg by mouth daily. Take with meals and large glass of water.Caution:Chemotherapy.    Historical Provider, MD  omeprazole (PRILOSEC) 20 MG capsule Take 1 capsule by mouth daily. 03/04/14   Historical Provider, MD  prochlorperazine (COMPAZINE) 10 MG tablet Take 1 tablet by mouth every 6 (six) hours as needed. 12/26/13   Historical Provider, MD   BP 146/78  Pulse 78  Temp(Src) 98.7 F (37.1 C) (Oral)  Resp 18  Ht 5\' 2"  (1.575 m)  Wt 354 lb (160.573 kg)  BMI 64.73 kg/m2  SpO2 99%  LMP 01/10/2013 Physical Exam  Nursing note reviewed. Constitutional: She is oriented to person, place, and time. She appears well-developed and well-nourished.  HENT:  Head: Normocephalic and  atraumatic.  Eyes: Conjunctivae are normal.  r pupil abnormal. Pts baseline  Cardiovascular: Normal rate and regular rhythm.   Pulmonary/Chest: Effort normal and breath sounds normal.  Musculoskeletal: Normal range of motion.  Neurological: She is alert and oriented to person, place, and time. She exhibits abnormal muscle tone. Coordination normal.  Normal finger to nose. No pronator drift. Sensation normal in right hand and arm. Equal strength bilaterally  Skin: Skin is warm and dry.    ED Course  Procedures (including critical care time) Labs Review Labs Reviewed  CBC WITH DIFFERENTIAL - Abnormal; Notable for the following:    RBC 3.61 (*)    Hemoglobin 11.1 (*)    HCT 33.3 (*)    Eosinophils Relative 9 (*)    All other components within normal limits  BASIC METABOLIC PANEL    Imaging Review Ct Head Wo Contrast  07/14/2014   CLINICAL DATA:  Headache with visual changes.  EXAM: CT HEAD WITHOUT CONTRAST  TECHNIQUE: Contiguous axial images were obtained from the base of the skull through the vertex without intravenous contrast.  COMPARISON:  Brain MRI from 07/31/2013.  Head CT from 07/19/2013.  FINDINGS: There is no evidence for acute hemorrhage, hydrocephalus, mass lesion, or abnormal extra-axial fluid collection. No definite CT evidence for acute infarction. Chronic mucosal thickening is seen in the ethmoid air cells, improved in the interval since the prior study. Frontal sinuses are hypoplastic. Sphenoid sinuses and visualized portions of the maxillary sinuses are clear. No evidence for mastoid air cell effusion.  IMPRESSION: No acute intracranial abnormality.  Chronic ethmoid sinus disease.   Electronically Signed   By: Misty Stanley M.D.   On: 07/14/2014 13:50     EKG Interpretation None      MDM   Final diagnoses:  Headache, unspecified headache type  URI (upper respiratory infection)    Pt is feeling better at this time. Doubt sah. Headache similar to previous  episodes. Likely related to uri symptoms. No meningeal symptoms. Pt can follow up with pcp    Glendell Docker, NP 07/14/14 1511

## 2014-07-14 NOTE — ED Provider Notes (Signed)
Medical screening examination/treatment/procedure(s) were performed by non-physician practitioner and as supervising physician I was immediately available for consultation/collaboration.   EKG Interpretation None       Threasa Beards, MD 07/14/14 [phone]

## 2014-07-14 NOTE — ED Notes (Signed)
Headache for a few days. They wake her in the middle of the night. No relief with Ibuprofen. Tingling in her right arm this am. States she thinks it may be carpel tunnel.

## 2014-07-14 NOTE — Discharge Instructions (Signed)

## 2014-07-24 ENCOUNTER — Telehealth: Payer: Self-pay

## 2014-07-24 NOTE — Telephone Encounter (Signed)
Patient aware and voiced understanding and agreed to call Oncology.     KP

## 2014-07-24 NOTE — Telephone Encounter (Signed)
Spoke with patient and made her aware that it may be best for her to discuss with oncologist since they know where she is in treatment, and we do not want to do anything that is against their orders. I advised I would ask one of the provider covering for Dr.Lowne and she voiced understanding, and said in the meantime she would call the oncologist and see if they approve.      KP

## 2014-07-24 NOTE — Telephone Encounter (Signed)
I would defer to oncology for now

## 2014-07-24 NOTE — Telephone Encounter (Signed)
Stacy Hill [phone]  [name] called to see if it was okay for her to get a flu shot at her job since she has cancer and is undergoing treatment.

## 2014-08-18 ENCOUNTER — Encounter (HOSPITAL_BASED_OUTPATIENT_CLINIC_OR_DEPARTMENT_OTHER): Payer: Self-pay | Admitting: Emergency Medicine

## 2014-08-29 ENCOUNTER — Encounter (HOSPITAL_COMMUNITY): Payer: Self-pay | Admitting: *Deleted

## 2014-08-29 ENCOUNTER — Emergency Department (HOSPITAL_COMMUNITY): Payer: BC Managed Care – PPO

## 2014-08-29 ENCOUNTER — Emergency Department (HOSPITAL_COMMUNITY)
Admission: EM | Admit: 2014-08-29 | Discharge: 2014-08-29 | Disposition: A | Discharge status: Home / Self Care | Payer: BC Managed Care – PPO | Attending: Emergency Medicine | Admitting: Emergency Medicine

## 2014-08-29 DIAGNOSIS — Z862 Personal history of diseases of the blood and blood-forming organs and certain disorders involving the immune mechanism: Secondary | ICD-10-CM | POA: Insufficient documentation

## 2014-08-29 DIAGNOSIS — J069 Acute upper respiratory infection, unspecified: Secondary | ICD-10-CM | POA: Diagnosis not present

## 2014-08-29 DIAGNOSIS — J989 Respiratory disorder, unspecified: Secondary | ICD-10-CM

## 2014-08-29 DIAGNOSIS — F329 Major depressive disorder, single episode, unspecified: Secondary | ICD-10-CM | POA: Insufficient documentation

## 2014-08-29 DIAGNOSIS — J029 Acute pharyngitis, unspecified: Secondary | ICD-10-CM | POA: Diagnosis present

## 2014-08-29 DIAGNOSIS — F419 Anxiety disorder, unspecified: Secondary | ICD-10-CM | POA: Insufficient documentation

## 2014-08-29 DIAGNOSIS — K219 Gastro-esophageal reflux disease without esophagitis: Secondary | ICD-10-CM | POA: Diagnosis not present

## 2014-08-29 DIAGNOSIS — Z79899 Other long term (current) drug therapy: Secondary | ICD-10-CM | POA: Diagnosis not present

## 2014-08-29 DIAGNOSIS — R51 Headache: Secondary | ICD-10-CM | POA: Insufficient documentation

## 2014-08-29 DIAGNOSIS — Z859 Personal history of malignant neoplasm, unspecified: Secondary | ICD-10-CM | POA: Insufficient documentation

## 2014-08-29 LAB — RAPID STREP SCREEN (MED CTR MEBANE ONLY): Streptococcus, Group A Screen (Direct): NEGATIVE

## 2014-08-29 MED ORDER — GUAIFENESIN ER 600 MG PO TB12: 600.0000 mg | ORAL_TABLET | Freq: Two times a day (BID) | ORAL | Status: DC | PRN

## 2014-08-29 MED ORDER — HYDROCODONE-HOMATROPINE 5-1.5 MG/5ML PO SYRP
5.0000 mL | ORAL_SOLUTION | Freq: Once | ORAL | Status: AC
  Administered 2014-08-29: 5 mL via ORAL
  Filled 2014-08-29: qty 5

## 2014-08-29 MED ORDER — HYDROCODONE-HOMATROPINE 5-1.5 MG/5ML PO SYRP: 5.0000 mL | ORAL_SOLUTION | Freq: Four times a day (QID) | ORAL | Status: DC | PRN

## 2014-08-29 MED ORDER — ALBUTEROL SULFATE HFA 108 (90 BASE) MCG/ACT IN AERS
2.0000 | INHALATION_SPRAY | Freq: Once | RESPIRATORY_TRACT | Status: AC
  Administered 2014-08-29: 2 via RESPIRATORY_TRACT
  Filled 2014-08-29: qty 6.7

## 2014-08-29 NOTE — ED Notes (Signed)
Pt reports cold sxs since yesterday am.  Pt reports h/a, sore throat and neck pain.  Has taken motrin without relief.  Denies Fever at this time.

## 2014-08-29 NOTE — Discharge Instructions (Signed)
Please follow up with your primary care physician in 1-2 days. If you do not have one please call the [place] number listed above. You may use her inhaler 2 puffs every 4-6 hours to help with cough, chest tightness. Please take Vicodin as prescribed. Please do not drive after taking this medication as a continued narcotic and may make you tired or drowsy. Please read all discharge instructions and return precautions.   Upper Respiratory Infection, Adult An upper respiratory infection (URI) is also sometimes known as the common cold. The upper respiratory tract includes the nose, sinuses, throat, trachea, and bronchi. Bronchi are the airways leading to the lungs. Most people improve within 1 week, but symptoms can last up to 2 weeks. A residual cough may last even longer.  CAUSES Many different viruses can infect the tissues lining the upper respiratory tract. The tissues become irritated and inflamed and often become very moist. Mucus production is also common. A cold is contagious. You can easily spread the virus to others by oral contact. This includes kissing, sharing a glass, coughing, or sneezing. Touching your mouth or nose and then touching a surface, which is then touched by another person, can also spread the virus. SYMPTOMS  Symptoms typically develop 1 to 3 days after you come in contact with a cold virus. Symptoms vary from person to person. They may include:  Runny nose.  Sneezing.  Nasal congestion.  Sinus irritation.  Sore throat.  Loss of voice (laryngitis).  Cough.  Fatigue.  Muscle aches.  Loss of appetite.  Headache.  Low-grade fever. DIAGNOSIS  You might diagnose your own cold based on familiar symptoms, since most people get a cold 2 to 3 times a year. Your caregiver can confirm this based on your exam. Most importantly, your caregiver can check that your symptoms are not due to another disease such as strep throat, sinusitis, pneumonia,  asthma, or epiglottitis. Blood tests, throat tests, and X-rays are not necessary to diagnose a common cold, but they may sometimes be helpful in excluding other more serious diseases. Your caregiver will decide if any further tests are required. RISKS AND COMPLICATIONS  You may be at risk for a more severe case of the common cold if you smoke cigarettes, have chronic heart disease (such as heart failure) or lung disease (such as asthma), or if you have a weakened immune system. The very young and very old are also at risk for more serious infections. Bacterial sinusitis, middle ear infections, and bacterial pneumonia can complicate the common cold. The common cold can worsen asthma and chronic obstructive pulmonary disease (COPD). Sometimes, these complications can require emergency medical care and may be life-threatening. PREVENTION  The best way to protect against getting a cold is to practice good hygiene. Avoid oral or hand contact with people with cold symptoms. Wash your hands often if contact occurs. There is no clear evidence that vitamin C, vitamin E, echinacea, or exercise reduces the chance of developing a cold. However, it is always recommended to get plenty of rest and practice good nutrition. TREATMENT  Treatment is directed at relieving symptoms. There is no cure. Antibiotics are not effective, because the infection is caused by a virus, not by bacteria. Treatment may include:  Increased fluid intake. Sports drinks offer valuable electrolytes, sugars, and fluids.  Breathing heated mist or steam (vaporizer or shower).  Eating chicken soup or other clear broths, and maintaining good nutrition.  Getting plenty of rest.  Using  gargles or lozenges for comfort.  Controlling fevers with ibuprofen or acetaminophen as directed by your caregiver.  Increasing usage of your inhaler if you have asthma. Zinc gel and zinc lozenges, taken in the first 24 hours of the common cold, can shorten the  duration and lessen the severity of symptoms. Pain medicines may help with fever, muscle aches, and throat pain. A variety of non-prescription medicines are available to treat congestion and runny nose. Your caregiver can make recommendations and may suggest nasal or lung inhalers for other symptoms.  HOME CARE INSTRUCTIONS   Only take over-the-counter or prescription medicines for pain, discomfort, or fever as directed by your caregiver.  Use a warm mist humidifier or inhale steam from a shower to increase air moisture. This may keep secretions moist and make it easier to breathe.  Drink enough water and fluids to keep your urine clear or pale yellow.  Rest as needed.  Return to work when your temperature has returned to normal or as your caregiver advises. You may need to stay home longer to avoid infecting others. You can also use a face mask and careful hand washing to prevent spread of the virus. SEEK MEDICAL CARE IF:   After the first few days, you feel you are getting worse rather than better.  You need your caregiver's advice about medicines to control symptoms.  You develop chills, worsening shortness of breath, or brown or red sputum. These may be signs of pneumonia.  You develop yellow or brown nasal discharge or pain in the face, especially when you bend forward. These may be signs of sinusitis.  You develop a fever, swollen neck glands, pain with swallowing, or white areas in the back of your throat. These may be signs of strep throat. SEEK IMMEDIATE MEDICAL CARE IF:   You have a fever.  You develop severe or persistent headache, ear pain, sinus pain, or chest pain.  You develop wheezing, a prolonged cough, cough up blood, or have a change in your usual mucus (if you have chronic lung disease).  You develop sore muscles or a stiff neck. Document Released: 03/29/2001 Document Revised: 12/26/2011 Document Reviewed: 01/08/2014 [facility] Patient Information 2015 [place],  [place]. This information is not intended to replace advice given to you by your health care provider. Make sure you discuss any questions you have with your health care provider.

## 2014-08-29 NOTE — ED Provider Notes (Signed)
CSN: [id number]     Arrival date & time 08/29/14  0112 History   First MD Initiated Contact with Patient 08/29/14 0130     Chief Complaint  Patient presents with  . URI  . Sore Throat  . Headache     (Consider location/radiation/quality/duration/timing/severity/associated sxs/prior Treatment) HPI Comments: Patient is a 43 year old female presenting to the emergency department for one day history of generalized headache, sore throat, nonproductive cough, nasal congestion and rhinorrhea with associated myalgias and subjective fevers and chills. She states she has taken Motrin without relief. No aggravating factors. No alleviating factors. Patient states her nieces and nephews are sick at home with viral respiratory illnesses. Denies any vomiting, abdominal pain, diarrhea.   Past Medical History  Diagnosis Date  . Fibroid   . Depression   . Seasonal allergies   . Shortness of breath     On exhertion,recently,with anemia per pt.  . Anemia   . Headache(784.0)   . GERD (gastroesophageal reflux disease)     Takes Tums prn.  . Restless leg syndrome   . Anxiety     Last visit to [facility] 2008 gave birth to stillborn, pt teary today discussing  . Stromal tumor of digestive system   . Cancer   . Gastrointestinal stromal tumor (GIST)    Past Surgical History  Procedure Laterality Date  . Cholecystectomy    . Cesarean section    . Abdominal hysterectomy N/A 02/13/2013    Procedure: HYSTERECTOMY ABDOMINAL WITH BILATERAL SALPINGECTOMY WITH EXTENSIVE LYSIS OF ADHESIONS;  Surgeon: Princess Bruins, MD;  Location: [facility];  Service: Gynecology;  Laterality: N/A;   Family History  Problem Relation Age of Onset  . Cancer Mother     Bladder cancer  . Hypertension Mother     Living, 21  . COPD Mother   . Hypertension Sister   . Heart attack Maternal Grandfather   . Other Father     Died, 80s   History  Substance Use Topics  . Smoking status: Never Smoker   . Smokeless tobacco: Never Used   . Alcohol Use: Yes     Comment: rare   OB History    Gravida Para Term Preterm AB TAB SAB Ectopic Multiple Living   2 2 1 1      1      Review of Systems  Constitutional: Positive for fever (subjective) and chills.  HENT: Positive for congestion, rhinorrhea and sore throat.   Respiratory: Positive for cough.   Gastrointestinal: Negative for nausea, vomiting, abdominal pain and diarrhea.  All other systems reviewed and are negative.     Allergies  Chocolate flavor; Ivp dye; Shellfish allergy; and Strawberry  Home Medications   Prior to Admission medications   Medication Sig Start Date End Date Taking? Authorizing Provider  albuterol (PROAIR HFA) 108 (90 BASE) MCG/ACT inhaler Inhale 1 puff into the lungs every 6 (six) hours as needed for wheezing or shortness of breath.  10/30/13  Yes Historical Provider, MD  diphenhydrAMINE (BENADRYL) 25 MG tablet Take 100 mg by mouth at bedtime as needed for itching.    Yes Historical Provider, MD  FLUoxetine (PROZAC) 40 MG capsule Take 1 capsule (40 mg total) by mouth daily. 05/13/14  Yes Yvonne R Lowne, DO  ibuprofen (ADVIL,MOTRIN) 200 MG tablet Take 800 mg by mouth every 6 (six) hours as needed for moderate pain.   Yes Historical Provider, MD  imatinib (GLEEVEC) 400 MG tablet Take 400 mg by mouth daily. Take with meals and  large glass of water.Caution:Chemotherapy.   Yes Historical Provider, MD  omeprazole (PRILOSEC) 20 MG capsule Take 20 mg by mouth daily.  03/04/14  Yes Historical Provider, MD  cetirizine (ZYRTEC) 10 MG tablet Take 20 mg by mouth daily as needed (for itching).     Historical Provider, MD  guaiFENesin (MUCINEX) 600 MG 12 hr tablet Take 1 tablet (600 mg total) by mouth 2 (two) times daily as needed for cough or to loosen phlegm. 08/29/14   Latish Toutant L Governor Matos, PA-C  HYDROcodone-homatropine (HYCODAN) 5-1.5 MG/5ML syrup Take 5 mLs by mouth every 6 (six) hours as needed for cough. 08/29/14   Nyeli Holtmeyer L Reichen Hutzler, PA-C   prochlorperazine (COMPAZINE) 10 MG tablet Take 1 tablet by mouth every 6 (six) hours as needed. 12/26/13   Historical Provider, MD   BP 131/77 mmHg  Pulse 89  Temp(Src) 98 F (36.7 C) (Oral)  Resp 18  SpO2 93%  LMP 01/10/2013 Physical Exam  Constitutional: She is oriented to person, place, and time. She appears well-developed and well-nourished. No distress.  HENT:  Head: Normocephalic and atraumatic.  Right Ear: External ear normal.  Left Ear: External ear normal.  Nose: Nose normal.  Mouth/Throat: Oropharynx is clear and moist. No oropharyngeal exudate.  Eyes: Conjunctivae are normal.  Neck: Normal range of motion. Neck supple.  Cardiovascular: Normal rate, regular rhythm and normal heart sounds.   Pulmonary/Chest: Effort normal and breath sounds normal. No respiratory distress. She has no wheezes. She exhibits no tenderness.  Abdominal: Soft. There is no tenderness.  Musculoskeletal: Normal range of motion. She exhibits no edema.  Lymphadenopathy:    She has no cervical adenopathy.  Neurological: She is alert and oriented to person, place, and time.  Skin: Skin is warm and dry. She is not diaphoretic.  Psychiatric: She has a normal mood and affect.  Nursing note and vitals reviewed.   ED Course  Procedures (including critical care time) Medications  HYDROcodone-homatropine (HYCODAN) 5-1.5 MG/5ML syrup 5 mL (5 mLs Oral Given 08/29/14 0249)  albuterol (PROVENTIL HFA;VENTOLIN HFA) 108 (90 BASE) MCG/ACT inhaler 2 puff (2 puffs Inhalation Given 08/29/14 0341)    Labs Review Labs Reviewed  RAPID STREP SCREEN  CULTURE, GROUP A STREP    Imaging Review Dg Chest 2 View  08/29/2014   CLINICAL DATA:  Sore throat and headache since August 26, 2014.  EXAM: CHEST  2 VIEW  COMPARISON:  Chest radiograph August 27, 2013  FINDINGS: Mild prominence of central bronchovascular markings, similar. No pleural effusions or focal consolidations. No pneumothorax. Soft tissue planes and  included osseous structures are nonsuspicious. Large body habitus. Approximately 1 cm radiopaque foreign body projects over the mid mediastinum only on the lateral radiograph and is likely external to the patient.  IMPRESSION: Mild prominence of central bronchovascular markings can be seen with bronchitis or reactive airway disease.   Electronically Signed   By: Elon Alas   On: 08/29/2014 02:50     EKG Interpretation None      MDM   Final diagnoses:  Respiratory illness    Filed Vitals:   08/29/14 0342  BP: 131/77  Pulse: 89  Temp: 98 F (36.7 C)  Resp: 18   Afebrile, NAD, non-toxic appearing, AAOx4.  Pt CXR negative for acute infiltrate. Patients symptoms are consistent with URI, likely viral etiology. Discussed that antibiotics are not indicated for viral infections. Pt will be discharged with symptomatic treatment.  Verbalizes understanding and is agreeable with plan. Pt is hemodynamically stable & in  NAD prior to dc. Patient is stable at time of discharge     Harlow Mares, PA-C 08/29/14 [place], MD 08/29/14 [phone]

## 2014-08-31 LAB — CULTURE, GROUP A STREP

## 2014-09-03 ENCOUNTER — Encounter (HOSPITAL_COMMUNITY): Payer: Self-pay | Admitting: Emergency Medicine

## 2014-09-03 ENCOUNTER — Emergency Department (HOSPITAL_COMMUNITY)
Admission: EM | Admit: 2014-09-03 | Discharge: 2014-09-03 | Disposition: A | Discharge status: Home / Self Care | Payer: BC Managed Care – PPO | Attending: Emergency Medicine | Admitting: Emergency Medicine

## 2014-09-03 DIAGNOSIS — Z8742 Personal history of other diseases of the female genital tract: Secondary | ICD-10-CM | POA: Insufficient documentation

## 2014-09-03 DIAGNOSIS — Z8669 Personal history of other diseases of the nervous system and sense organs: Secondary | ICD-10-CM | POA: Insufficient documentation

## 2014-09-03 DIAGNOSIS — Z859 Personal history of malignant neoplasm, unspecified: Secondary | ICD-10-CM | POA: Insufficient documentation

## 2014-09-03 DIAGNOSIS — Z862 Personal history of diseases of the blood and blood-forming organs and certain disorders involving the immune mechanism: Secondary | ICD-10-CM | POA: Insufficient documentation

## 2014-09-03 DIAGNOSIS — R1110 Vomiting, unspecified: Secondary | ICD-10-CM | POA: Diagnosis present

## 2014-09-03 DIAGNOSIS — Z79899 Other long term (current) drug therapy: Secondary | ICD-10-CM | POA: Insufficient documentation

## 2014-09-03 DIAGNOSIS — F329 Major depressive disorder, single episode, unspecified: Secondary | ICD-10-CM | POA: Insufficient documentation

## 2014-09-03 DIAGNOSIS — R630 Anorexia: Secondary | ICD-10-CM | POA: Insufficient documentation

## 2014-09-03 DIAGNOSIS — J069 Acute upper respiratory infection, unspecified: Secondary | ICD-10-CM | POA: Insufficient documentation

## 2014-09-03 DIAGNOSIS — F419 Anxiety disorder, unspecified: Secondary | ICD-10-CM | POA: Insufficient documentation

## 2014-09-03 DIAGNOSIS — K219 Gastro-esophageal reflux disease without esophagitis: Secondary | ICD-10-CM | POA: Insufficient documentation

## 2014-09-03 MED ORDER — DM-GUAIFENESIN ER 30-600 MG PO TB12: 1.0000 | ORAL_TABLET | Freq: Two times a day (BID) | ORAL | Status: DC

## 2014-09-03 MED ORDER — ONDANSETRON HCL 4 MG/2ML IJ SOLN: 4.0000 mg | Freq: Once | INTRAMUSCULAR | Status: DC

## 2014-09-03 NOTE — ED Provider Notes (Signed)
CSN: [id number]     Arrival date & time 09/03/14  1114 History   First MD Initiated Contact with Patient 09/03/14 1128     Chief Complaint  Patient presents with  . URI  . Emesis     (Consider location/radiation/quality/duration/timing/severity/associated sxs/prior Treatment) HPI Comments: Patient is a 43 year old female past history of gastric cancer currently on 3-year chemotherapy presents emergency room chief complaint of persistent cough and one episode of vomiting. Patient reports nasal congestion, rhinorrhea, cough, postnasal drip persistent since being evaluated in emergency department 11/13. Patient reports taking medication without resolution she reports taking there are few, DayQuil, Mucinex [place] at same time. Patient reports decrease in appetite, with illness. She reports taking all medication on an empty stomach today and had a questionable post tussis emesis. Patient denies fever, chills. Denies abdominal pain, diarrhea, constipation. Patient concerned about cruise later this week and if she is infectious. And when to restart her chemotherapy medications, stopped due to taking over-the-counter medications. Oncologist at [facility].  Patient is a 43 y.o. female presenting with URI and vomiting. The history is provided by the patient. No language interpreter was used.  URI Presenting symptoms: congestion, rhinorrhea and sore throat   Presenting symptoms: no fever   Emesis Associated symptoms: sore throat and URI   Associated symptoms: no chills     Past Medical History  Diagnosis Date  . Fibroid   . Depression   . Seasonal allergies   . Shortness of breath     On exhertion,recently,with anemia per pt.  . Anemia   . Headache(784.0)   . GERD (gastroesophageal reflux disease)     Takes Tums prn.  . Restless leg syndrome   . Anxiety     Last visit to [facility] 2008 gave birth to stillborn, pt teary today discussing  . Stromal tumor of digestive system   . Cancer   .  Gastrointestinal stromal tumor (GIST)    Past Surgical History  Procedure Laterality Date  . Cholecystectomy    . Cesarean section    . Abdominal hysterectomy N/A 02/13/2013    Procedure: HYSTERECTOMY ABDOMINAL WITH BILATERAL SALPINGECTOMY WITH EXTENSIVE LYSIS OF ADHESIONS;  Surgeon: Princess Bruins, MD;  Location: [facility];  Service: Gynecology;  Laterality: N/A;   Family History  Problem Relation Age of Onset  . Cancer Mother     Bladder cancer  . Hypertension Mother     Living, 33  . COPD Mother   . Hypertension Sister   . Heart attack Maternal Grandfather   . Other Father     Died, 80s   History  Substance Use Topics  . Smoking status: Never Smoker   . Smokeless tobacco: Never Used  . Alcohol Use: Yes     Comment: rare   OB History    Gravida Para Term Preterm AB TAB SAB Ectopic Multiple Living   2 2 1 1      1      Review of Systems  Constitutional: Positive for appetite change. Negative for fever and chills.  HENT: Positive for congestion, postnasal drip, rhinorrhea and sore throat.   Gastrointestinal: Positive for vomiting.      Allergies  Chocolate flavor; Ivp dye; Shellfish allergy; and Strawberry  Home Medications   Prior to Admission medications   Medication Sig Start Date End Date Taking? Authorizing Provider  albuterol (PROAIR HFA) 108 (90 BASE) MCG/ACT inhaler Inhale 1 puff into the lungs every 6 (six) hours as needed for wheezing or shortness of breath.  10/30/13  Yes Historical Provider, MD  cetirizine (ZYRTEC) 10 MG tablet Take 20 mg by mouth daily as needed (for itching).    Yes Historical Provider, MD  diphenhydrAMINE (BENADRYL) 25 MG tablet Take 100 mg by mouth at bedtime as needed for itching.    Yes Historical Provider, MD  DM-Doxylamine-Acetaminophen (VICKS NYQUIL COLD & FLU) 15-6.25-325 MG/15ML LIQD Take 30 mLs by mouth every 4 (four) hours as needed (for cold and sleep).   Yes Historical Provider, MD  DM-Phenylephrine-Acetaminophen (VICKS  DAYQUIL COLD & FLU) 10-5-325 MG/15ML LIQD Take 30 mLs by mouth every 4 (four) hours as needed (for cold).   Yes Historical Provider, MD  FLUoxetine (PROZAC) 40 MG capsule Take 1 capsule (40 mg total) by mouth daily. 05/13/14  Yes Yvonne R Lowne, DO  HYDROcodone-homatropine (HYCODAN) 5-1.5 MG/5ML syrup Take 5 mLs by mouth every 6 (six) hours as needed for cough. 08/29/14  Yes Jennifer L Piepenbrink, PA-C  ibuprofen (ADVIL,MOTRIN) 200 MG tablet Take 800 mg by mouth every 6 (six) hours as needed for moderate pain.   Yes Historical Provider, MD  imatinib (GLEEVEC) 400 MG tablet Take 400 mg by mouth daily. Take with meals and large glass of water.Caution:Chemotherapy.   Yes Historical Provider, MD  omeprazole (PRILOSEC) 20 MG capsule Take 20 mg by mouth daily.  03/04/14  Yes Historical Provider, MD  Phenyleph-Diphenhyd-DM-APAP [facility] SEVERE COLD & COUGH) PACKET MISC Take 1 packet by mouth once.   Yes Historical Provider, MD  Phenylephrine-DM-GG-APAP (MUCINEX FAST-MAX PO) Take 1 tablet by mouth every 12 (twelve) hours as needed (for cold).   Yes Historical Provider, MD  prochlorperazine (COMPAZINE) 10 MG tablet Take 1 tablet by mouth every 6 (six) hours as needed for nausea or vomiting.  12/26/13  Yes Historical Provider, MD  guaiFENesin (MUCINEX) 600 MG 12 hr tablet Take 1 tablet (600 mg total) by mouth 2 (two) times daily as needed for cough or to loosen phlegm. 08/29/14   Jennifer L Piepenbrink, PA-C   BP 118/70 mmHg  Pulse 93  Temp(Src) 97.8 F (36.6 C) (Oral)  Resp 22  SpO2 97%  LMP 01/10/2013 Physical Exam  Constitutional: She is oriented to person, place, and time. She appears well-developed and well-nourished. No distress.  HENT:  Head: Normocephalic and atraumatic.  Right Ear: Tympanic membrane and external ear normal.  Left Ear: Tympanic membrane and external ear normal.  Mouth/Throat: Oropharynx is clear and moist.  Cardiovascular: Normal rate and regular rhythm.   Pulmonary/Chest:  Effort normal and breath sounds normal. No respiratory distress. She has no wheezes. She has no rales.  Abdominal: Soft. There is no tenderness. There is no rebound and no guarding.  Musculoskeletal: Normal range of motion.  Neurological: She is alert and oriented to person, place, and time.  Skin: Skin is warm and dry. She is not diaphoretic.  Psychiatric: She has a normal mood and affect. Her behavior is normal.  Nursing note and vitals reviewed.   ED Course  Procedures (including critical care time) Labs Review Labs Reviewed - No data to display  Imaging Review No results found.   EKG Interpretation None      MDM   Final diagnoses:  URI (upper respiratory infection)  Post-tussive emesis   Patient presents with persistent cough, negative chest x-ray several days ago, afebrile. Patient's main concern of if she can go on her cruise later this week. She reports one episode of posttussis emesis. No abdominal pain. Plan to treat with Mucinex DM only, DC  all other over-the-counter medication as this can cause lightheadedness and nausea. Advised small meals, plenty fluids, salt water gargles, Afrin for nasal congestion. Advised following up with her oncologist at [facility] for restarting her [place].   Harvie Heck, PA-C 09/03/14 1627  Mariea Clonts, MD 09/05/14 [phone]

## 2014-09-03 NOTE — ED Notes (Signed)
Pt states, "I cant get rid of this cold, I was here on Friday and I have taken all the meds and OTC."  Pt states that she started vomiting today. Pt states vomit at first was just mucous but then after she had shake vomited it up.

## 2014-09-03 NOTE — Discharge Instructions (Signed)
Call for a follow up appointment with a Family or Primary Care Provider.  Call your oncologist for further evaluation and advice on restarting your chemotherapy. Return if Symptoms worsen.   Take medication as prescribed.  Drink plenty of fluids. Salt water gargle 3-4 times a day. Afrin for 3 days only.

## 2014-09-15 ENCOUNTER — Encounter (HOSPITAL_COMMUNITY): Payer: Self-pay | Admitting: Family Medicine

## 2014-09-15 ENCOUNTER — Inpatient Hospital Stay (HOSPITAL_COMMUNITY)
Admission: EM | Admit: 2014-09-15 | Discharge: 2014-09-18 | DRG: 392 | Disposition: A | Discharge status: Home / Self Care | Payer: BC Managed Care – PPO | Attending: Internal Medicine | Admitting: Internal Medicine

## 2014-09-15 ENCOUNTER — Emergency Department (HOSPITAL_COMMUNITY): Payer: BC Managed Care – PPO

## 2014-09-15 DIAGNOSIS — A084 Viral intestinal infection, unspecified: Principal | ICD-10-CM | POA: Diagnosis present

## 2014-09-15 DIAGNOSIS — A0811 Acute gastroenteropathy due to Norwalk agent: Secondary | ICD-10-CM | POA: Diagnosis present

## 2014-09-15 DIAGNOSIS — J302 Other seasonal allergic rhinitis: Secondary | ICD-10-CM | POA: Diagnosis present

## 2014-09-15 DIAGNOSIS — Z91041 Radiographic dye allergy status: Secondary | ICD-10-CM

## 2014-09-15 DIAGNOSIS — A09 Infectious gastroenteritis and colitis, unspecified: Secondary | ICD-10-CM

## 2014-09-15 DIAGNOSIS — D649 Anemia, unspecified: Secondary | ICD-10-CM | POA: Diagnosis present

## 2014-09-15 DIAGNOSIS — R1013 Epigastric pain: Secondary | ICD-10-CM

## 2014-09-15 DIAGNOSIS — C494 Malignant neoplasm of connective and soft tissue of abdomen: Secondary | ICD-10-CM | POA: Diagnosis present

## 2014-09-15 DIAGNOSIS — K3189 Other diseases of stomach and duodenum: Secondary | ICD-10-CM | POA: Diagnosis present

## 2014-09-15 DIAGNOSIS — Z79899 Other long term (current) drug therapy: Secondary | ICD-10-CM

## 2014-09-15 DIAGNOSIS — I951 Orthostatic hypotension: Secondary | ICD-10-CM | POA: Insufficient documentation

## 2014-09-15 DIAGNOSIS — G2581 Restless legs syndrome: Secondary | ICD-10-CM | POA: Diagnosis present

## 2014-09-15 DIAGNOSIS — Z91018 Allergy to other foods: Secondary | ICD-10-CM

## 2014-09-15 DIAGNOSIS — K219 Gastro-esophageal reflux disease without esophagitis: Secondary | ICD-10-CM | POA: Diagnosis present

## 2014-09-15 DIAGNOSIS — R197 Diarrhea, unspecified: Secondary | ICD-10-CM | POA: Diagnosis present

## 2014-09-15 DIAGNOSIS — R109 Unspecified abdominal pain: Secondary | ICD-10-CM | POA: Insufficient documentation

## 2014-09-15 DIAGNOSIS — D539 Nutritional anemia, unspecified: Secondary | ICD-10-CM | POA: Diagnosis present

## 2014-09-15 DIAGNOSIS — R1110 Vomiting, unspecified: Secondary | ICD-10-CM

## 2014-09-15 DIAGNOSIS — F329 Major depressive disorder, single episode, unspecified: Secondary | ICD-10-CM | POA: Diagnosis present

## 2014-09-15 DIAGNOSIS — Z6841 Body Mass Index (BMI) 40.0 and over, adult: Secondary | ICD-10-CM

## 2014-09-15 DIAGNOSIS — Z91013 Allergy to seafood: Secondary | ICD-10-CM

## 2014-09-15 DIAGNOSIS — Z903 Acquired absence of stomach [part of]: Secondary | ICD-10-CM | POA: Diagnosis present

## 2014-09-15 DIAGNOSIS — R112 Nausea with vomiting, unspecified: Secondary | ICD-10-CM | POA: Diagnosis present

## 2014-09-15 DIAGNOSIS — R42 Dizziness and giddiness: Secondary | ICD-10-CM | POA: Insufficient documentation

## 2014-09-15 DIAGNOSIS — Z90710 Acquired absence of both cervix and uterus: Secondary | ICD-10-CM

## 2014-09-15 DIAGNOSIS — F419 Anxiety disorder, unspecified: Secondary | ICD-10-CM | POA: Diagnosis present

## 2014-09-15 DIAGNOSIS — E876 Hypokalemia: Secondary | ICD-10-CM | POA: Diagnosis present

## 2014-09-15 DIAGNOSIS — K529 Noninfective gastroenteritis and colitis, unspecified: Secondary | ICD-10-CM | POA: Diagnosis present

## 2014-09-15 DIAGNOSIS — Z9049 Acquired absence of other specified parts of digestive tract: Secondary | ICD-10-CM | POA: Diagnosis present

## 2014-09-15 DIAGNOSIS — A0819 Acute gastroenteropathy due to other small round viruses: Secondary | ICD-10-CM | POA: Diagnosis present

## 2014-09-15 HISTORY — DX: Nausea with vomiting, unspecified: R11.2

## 2014-09-15 LAB — CBC WITH DIFFERENTIAL/PLATELET
BASOS ABS: 0 10*3/uL (ref 0.0–0.1)
Basophils Relative: 0 % (ref 0–1)
EOS ABS: 0.2 10*3/uL (ref 0.0–0.7)
EOS PCT: 3 % (ref 0–5)
HCT: 39.3 % (ref 36.0–46.0)
Hemoglobin: 13 g/dL (ref 12.0–15.0)
LYMPHS ABS: 0.9 10*3/uL (ref 0.7–4.0)
LYMPHS PCT: 12 % (ref 12–46)
MCH: 29.9 pg (ref 26.0–34.0)
MCHC: 33.1 g/dL (ref 30.0–36.0)
MCV: 90.3 fL (ref 78.0–100.0)
Monocytes Absolute: 0.4 10*3/uL (ref 0.1–1.0)
Monocytes Relative: 5 % (ref 3–12)
Neutro Abs: 5.6 10*3/uL (ref 1.7–7.7)
Neutrophils Relative %: 80 % — ABNORMAL HIGH (ref 43–77)
PLATELETS: 264 10*3/uL (ref 150–400)
RBC: 4.35 MIL/uL (ref 3.87–5.11)
RDW: 13.6 % (ref 11.5–15.5)
WBC: 7.1 10*3/uL (ref 4.0–10.5)

## 2014-09-15 LAB — URINE MICROSCOPIC-ADD ON

## 2014-09-15 LAB — COMPREHENSIVE METABOLIC PANEL
ALK PHOS: 120 U/L — AB (ref 39–117)
ALT: 34 U/L (ref 0–35)
AST: 27 U/L (ref 0–37)
Albumin: 3.8 g/dL (ref 3.5–5.2)
Anion gap: 15 (ref 5–15)
BUN: 10 mg/dL (ref 6–23)
CO2: 23 mEq/L (ref 19–32)
Calcium: 9.1 mg/dL (ref 8.4–10.5)
Chloride: 102 mEq/L (ref 96–112)
Creatinine, Ser: 0.61 mg/dL (ref 0.50–1.10)
GFR calc Af Amer: >90 mL/min (ref >90)
GFR calc non Af Amer: >90 mL/min (ref >90)
Glucose, Bld: 89 mg/dL (ref 70–99)
POTASSIUM: 4.1 meq/L (ref 3.7–5.3)
Sodium: 140 mEq/L (ref 137–147)
TOTAL PROTEIN: 8.2 g/dL (ref 6.0–8.3)
Total Bilirubin: 0.4 mg/dL (ref 0.3–1.2)

## 2014-09-15 LAB — LIPASE, BLOOD: Lipase: 17 U/L (ref 11–59)

## 2014-09-15 LAB — URINALYSIS, ROUTINE W REFLEX MICROSCOPIC
Bilirubin Urine: NEGATIVE
GLUCOSE, UA: NEGATIVE mg/dL
Ketones, ur: NEGATIVE mg/dL
Nitrite: NEGATIVE
Protein, ur: 30 mg/dL — AB
SPECIFIC GRAVITY, URINE: 1.028 (ref 1.005–1.030)
Urobilinogen, UA: 0.2 mg/dL (ref 0.0–1.0)
pH: 5.5 (ref 5.0–8.0)

## 2014-09-15 MED ORDER — LORATADINE 10 MG PO TABS
10.0000 mg | ORAL_TABLET | Freq: Every day | ORAL | Status: DC
  Administered 2014-09-16 – 2014-09-18 (×3): 10 mg via ORAL
  Filled 2014-09-15 (×3): qty 1

## 2014-09-15 MED ORDER — FLUOXETINE HCL 20 MG PO CAPS
40.0000 mg | ORAL_CAPSULE | Freq: Every day | ORAL | Status: DC
  Administered 2014-09-15 – 2014-09-18 (×4): 40 mg via ORAL
  Filled 2014-09-15 (×4): qty 2

## 2014-09-15 MED ORDER — SODIUM CHLORIDE 0.9 % IV SOLN
INTRAVENOUS | Status: DC
  Administered 2014-09-15: 100 mL/h via INTRAVENOUS
  Administered 2014-09-15 – 2014-09-17 (×3): via INTRAVENOUS

## 2014-09-15 MED ORDER — GUAIFENESIN ER 600 MG PO TB12: 600.0000 mg | ORAL_TABLET | Freq: Two times a day (BID) | ORAL | Status: DC | PRN

## 2014-09-15 MED ORDER — ONDANSETRON 4 MG PO TBDP
8.0000 mg | ORAL_TABLET | Freq: Once | ORAL | Status: AC
  Administered 2014-09-15: 8 mg via ORAL
  Filled 2014-09-15: qty 2

## 2014-09-15 MED ORDER — METRONIDAZOLE 500 MG PO TABS: 500.0000 mg | ORAL_TABLET | Freq: Three times a day (TID) | ORAL | Status: DC

## 2014-09-15 MED ORDER — PROMETHAZINE HCL 25 MG/ML IJ SOLN
25.0000 mg | Freq: Once | INTRAMUSCULAR | Status: AC
  Administered 2014-09-15: 25 mg via INTRAVENOUS
  Filled 2014-09-15: qty 1

## 2014-09-15 MED ORDER — IMATINIB MESYLATE 100 MG PO TABS
400.0000 mg | ORAL_TABLET | Freq: Every day | ORAL | Status: DC
  Filled 2014-09-15 (×2): qty 4

## 2014-09-15 MED ORDER — ONDANSETRON HCL 4 MG/2ML IJ SOLN
4.0000 mg | Freq: Once | INTRAMUSCULAR | Status: DC
  Filled 2014-09-15: qty 2

## 2014-09-15 MED ORDER — ONDANSETRON HCL 4 MG PO TABS: 4.0000 mg | ORAL_TABLET | Freq: Four times a day (QID) | ORAL | Status: DC | PRN

## 2014-09-15 MED ORDER — SODIUM CHLORIDE 0.9 % IV SOLN: INTRAVENOUS | Status: DC

## 2014-09-15 MED ORDER — HYDROCODONE-ACETAMINOPHEN 5-325 MG PO TABS: 1.0000 | ORAL_TABLET | Freq: Four times a day (QID) | ORAL | Status: DC | PRN

## 2014-09-15 MED ORDER — HEPARIN SODIUM (PORCINE) 5000 UNIT/ML IJ SOLN
5000.0000 [IU] | Freq: Three times a day (TID) | INTRAMUSCULAR | Status: DC
  Administered 2014-09-15 – 2014-09-18 (×7): 5000 [IU] via SUBCUTANEOUS
  Filled 2014-09-15 (×9): qty 1

## 2014-09-15 MED ORDER — PROMETHAZINE HCL 25 MG PO TABS: 25.0000 mg | ORAL_TABLET | Freq: Four times a day (QID) | ORAL | Status: DC | PRN

## 2014-09-15 MED ORDER — ALBUTEROL SULFATE (2.5 MG/3ML) 0.083% IN NEBU: 2.5000 mg | INHALATION_SOLUTION | Freq: Four times a day (QID) | RESPIRATORY_TRACT | Status: DC | PRN

## 2014-09-15 MED ORDER — ONDANSETRON HCL 4 MG/2ML IJ SOLN
4.0000 mg | Freq: Four times a day (QID) | INTRAMUSCULAR | Status: DC | PRN
  Administered 2014-09-16: 4 mg via INTRAVENOUS
  Filled 2014-09-15: qty 2

## 2014-09-15 MED ORDER — CIPROFLOXACIN HCL 500 MG PO TABS: 500.0000 mg | ORAL_TABLET | Freq: Two times a day (BID) | ORAL | Status: DC

## 2014-09-15 MED ORDER — MORPHINE SULFATE 4 MG/ML IJ SOLN
4.0000 mg | Freq: Once | INTRAMUSCULAR | Status: AC
  Administered 2014-09-15: 4 mg via INTRAVENOUS
  Filled 2014-09-15: qty 1

## 2014-09-15 MED ORDER — OXYCODONE HCL 5 MG PO TABS: 5.0000 mg | ORAL_TABLET | Freq: Four times a day (QID) | ORAL | Status: DC | PRN

## 2014-09-15 MED ORDER — HYDROCODONE-ACETAMINOPHEN 5-325 MG PO TABS: 1.0000 | ORAL_TABLET | ORAL | Status: DC | PRN

## 2014-09-15 MED ORDER — MORPHINE SULFATE 4 MG/ML IJ SOLN: 4.0000 mg | Freq: Once | INTRAMUSCULAR | Status: DC

## 2014-09-15 MED ORDER — ACETAMINOPHEN 650 MG RE SUPP: 650.0000 mg | Freq: Four times a day (QID) | RECTAL | Status: DC | PRN

## 2014-09-15 MED ORDER — ACETAMINOPHEN 325 MG PO TABS: 650.0000 mg | ORAL_TABLET | Freq: Four times a day (QID) | ORAL | Status: DC | PRN

## 2014-09-15 MED ORDER — DM-GUAIFENESIN ER 30-600 MG PO TB12
1.0000 | ORAL_TABLET | Freq: Two times a day (BID) | ORAL | Status: DC
  Administered 2014-09-15 – 2014-09-18 (×6): 1 via ORAL
  Filled 2014-09-15 (×8): qty 1

## 2014-09-15 MED ORDER — PANTOPRAZOLE SODIUM 40 MG PO TBEC
40.0000 mg | DELAYED_RELEASE_TABLET | Freq: Every day | ORAL | Status: DC
  Administered 2014-09-15 – 2014-09-18 (×4): 40 mg via ORAL
  Filled 2014-09-15 (×4): qty 1

## 2014-09-15 MED ORDER — SODIUM CHLORIDE 0.9 % IV BOLUS (SEPSIS)
1000.0000 mL | Freq: Once | INTRAVENOUS | Status: AC
Start: 2014-09-15 — End: 2014-09-15
  Administered 2014-09-15: 1000 mL via INTRAVENOUS

## 2014-09-15 MED ORDER — IMATINIB MESYLATE 100 MG PO TABS: 400.0000 mg | ORAL_TABLET | Freq: Every day | ORAL | Status: DC

## 2014-09-15 MED ORDER — ONDANSETRON HCL 4 MG/2ML IJ SOLN: 4.0000 mg | Freq: Three times a day (TID) | INTRAMUSCULAR | Status: DC | PRN

## 2014-09-15 MED ORDER — DIPHENHYDRAMINE HCL 25 MG PO TABS: 100.0000 mg | ORAL_TABLET | Freq: Every evening | ORAL | Status: DC | PRN

## 2014-09-15 NOTE — ED Notes (Signed)
EDP at bedside. Patient attempting to get ready and leave the room for discharge. Patient ambulatory to wheelchair and emesis 261ml green water mixture.  Provider notified.

## 2014-09-15 NOTE — Progress Notes (Signed)
Report received from [facility] for admission to [phone]

## 2014-09-15 NOTE — Progress Notes (Signed)
Stacy Hill [id number] Code Status: FULL Admission Data: 09/15/2014 6:13 PM Attending Provider:  Aileen Fass XBW:IOMBTD Etter Sjogren, DO Consults/ Treatment Team:    Stacy Hill is a 43 y.o. female patient admitted from ED awake, alert - oriented  X 3 - no acute distress noted.  VSS - Blood pressure 80/35, pulse 92, temperature 98.7 F (37.1 C), temperature source Oral, resp. rate 20, height 5\' 2"  (1.575 m), weight 167.786 kg (369 lb 14.4 oz), last menstrual period 01/10/2013, SpO2 100 %. MD paged about BP.   IV in place, occlusive dsg intact without redness.  Orientation to room, and floor completed with information packet given to patient/family.  Patient declined safety video at this time.  Admission INP armband ID verified with patient/family, and in place.   SR up x 2, fall assessment complete, with patient and family able to verbalize understanding of risk associated with falls, and verbalized understanding to call nsg before up out of bed.  Call light within reach, patient able to voice, and demonstrate understanding.  Skin, clean-dry- intact without evidence of bruising, or skin tears.   No evidence of skin break down noted on exam.     Will cont to eval and treat per MD orders.  Delman Cheadle, RN 09/15/2014 6:13 PM

## 2014-09-15 NOTE — H&P (Signed)
[facility] History and Physical  Stacy Hill PFX:[id number] DOB: 06/11/1971 DOA: 09/15/2014  Referring physician: Dr. Glenna Fellows PCP: Garnet Koyanagi, DO   Chief Complaint: abd pain nausea and vomting  HPI: Stacy Hill is a 43 y.o. female past medical history of fibroids with hysterectomy, also just tumor on [place], which recently returned from a cruise ship who comes into the ED complaining of sudden onset of abdominal pain nausea vomiting and diarrhea she was the pain is nonradiating intermittent in the epigastric area. Nothing makes it better or worse. She relates she's been having watery diarrhea since yesterday with no blood. She woke up this morning and still cannot tolerate anything by mouth swish came to the ED. She relates she has not taking anything at home or any remedies. She relates there is no sick contacts and she denies any fever, chills, chest pain, hematemesis, melena vaginal discharge. She denies any ulcerations in her skin or any vaginal discharge.  In the ED: She was given a trial of IV fluids and IV nausea medication and she was improving but she started vomiting. So we were consulted for further evaluation.   Review of Systems:  Constitutional:  No weight loss, night sweats, Fevers, chills, fatigue.  HEENT:  No headaches, Difficulty swallowing,Tooth/dental problems,Sore throat,  No sneezing, itching, ear ache, nasal congestion, post nasal drip,  Cardio-vascular:  No chest pain, Orthopnea, PND, swelling in lower extremities, anasarca, dizziness, palpitations  Resp:  No shortness of breath with exertion or at rest. No excess mucus, no productive cough, No non-productive cough, No coughing up of blood.No change in color of mucus.No wheezing.No chest wall deformity  Skin:  no rash or lesions.  GU:  no dysuria, change in color of urine, no urgency or frequency. No flank pain.  Musculoskeletal:  No joint pain or swelling. No decreased range of motion. No  back pain.  Psych:  No change in mood or affect. No depression or anxiety. No memory loss.   Past Medical History  Diagnosis Date  . Fibroid   . Depression   . Seasonal allergies   . Shortness of breath     On exhertion,recently,with anemia per pt.  . Anemia   . Headache(784.0)   . GERD (gastroesophageal reflux disease)     Takes Tums prn.  . Restless leg syndrome   . Anxiety     Last visit to [facility] 2008 gave birth to stillborn, pt teary today discussing  . Stromal tumor of digestive system   . Cancer   . Gastrointestinal stromal tumor (GIST)    Past Surgical History  Procedure Laterality Date  . Cholecystectomy    . Cesarean section    . Abdominal hysterectomy N/A 02/13/2013    Procedure: HYSTERECTOMY ABDOMINAL WITH BILATERAL SALPINGECTOMY WITH EXTENSIVE LYSIS OF ADHESIONS;  Surgeon: Princess Bruins, MD;  Location: [facility];  Service: Gynecology;  Laterality: N/A;   Social History:  reports that she has never smoked. She has never used smokeless tobacco. She reports that she drinks alcohol. She reports that she does not use illicit drugs.  Allergies  Allergen Reactions  . Chocolate Flavor Hives  . Ivp Dye [Iodinated Diagnostic Agents] Hives  . Shellfish Allergy Hives  . Strawberry Hives    Family History  Problem Relation Age of Onset  . Cancer Mother     Bladder cancer  . Hypertension Mother     Living, 10  . COPD Mother   . Hypertension Sister   . Heart attack Maternal Grandfather   .  Other Father     Died, 80s     Prior to Admission medications   Medication Sig Start Date End Date Taking? Authorizing Provider  albuterol (PROAIR HFA) 108 (90 BASE) MCG/ACT inhaler Inhale 1 puff into the lungs every 6 (six) hours as needed for wheezing or shortness of breath.  10/30/13  Yes Historical Provider, MD  cetirizine (ZYRTEC) 10 MG tablet Take 20 mg by mouth daily as needed (for itching).    Yes Historical Provider, MD  dextromethorphan-guaiFENesin (MUCINEX DM) 30-600 MG  per 12 hr tablet Take 1 tablet by mouth 2 (two) times daily. 09/03/14  Yes Harvie Heck, PA-C  diphenhydrAMINE (BENADRYL) 25 MG tablet Take 100 mg by mouth at bedtime as needed for itching.    Yes Historical Provider, MD  FLUoxetine (PROZAC) 40 MG capsule Take 1 capsule (40 mg total) by mouth daily. 05/13/14  Yes Yvonne R Lowne, DO  ibuprofen (ADVIL,MOTRIN) 200 MG tablet Take 800 mg by mouth every 6 (six) hours as needed for moderate pain.   Yes Historical Provider, MD  imatinib (GLEEVEC) 400 MG tablet Take 400 mg by mouth daily. Take with meals and large glass of water.Caution:Chemotherapy.   Yes Historical Provider, MD  omeprazole (PRILOSEC) 20 MG capsule Take 20 mg by mouth daily.  03/04/14  Yes Historical Provider, MD  prochlorperazine (COMPAZINE) 10 MG tablet Take 1 tablet by mouth every 6 (six) hours as needed for nausea or vomiting.  12/26/13  Yes Historical Provider, MD  ciprofloxacin (CIPRO) 500 MG tablet Take 1 tablet (500 mg total) by mouth 2 (two) times daily. One po bid x 7 days 09/15/14   Mercedes Strupp Camprubi-Soms, PA-C  guaiFENesin (MUCINEX) 600 MG 12 hr tablet Take 1 tablet (600 mg total) by mouth 2 (two) times daily as needed for cough or to loosen phlegm. Patient not taking: Reported on 09/04/2014 08/29/14   Stephani Police Piepenbrink, PA-C  HYDROcodone-acetaminophen (NORCO) 5-325 MG per tablet Take 1 tablet by mouth every 6 (six) hours as needed for severe pain. 09/15/14   Mercedes Strupp Camprubi-Soms, PA-C  metroNIDAZOLE (FLAGYL) 500 MG tablet Take 1 tablet (500 mg total) by mouth 3 (three) times daily. One po bid x 7 days 09/15/14   Mercedes Strupp Camprubi-Soms, PA-C  oxyCODONE (ROXICODONE) 5 MG immediate release tablet Take 1 tablet (5 mg total) by mouth every 6 (six) hours as needed for severe pain. 09/15/14   Mercedes Strupp Camprubi-Soms, PA-C  promethazine (PHENERGAN) 25 MG tablet Take 1 tablet (25 mg total) by mouth every 6 (six) hours as needed for nausea or vomiting.  09/15/14   Mercedes Strupp Camprubi-Soms, PA-C   Physical Exam: Filed Vitals:   09/15/14 1330 09/15/14 1515 09/15/14 1541 09/15/14 1642  BP: 127/66 106/50  117/43  Pulse: 81 84  97  Temp:   98.4 F (36.9 C)   TempSrc:   Oral   Resp: 17 22  22   SpO2: 100% 100%  100%    Wt Readings from Last 3 Encounters:  07/14/14 160.573 kg (354 lb)  05/13/14 160.573 kg (354 lb)  09/26/13 163.295 kg (360 lb)    General:  Appears calm and comfortable Eyes: PERRL, normal lids, irises & conjunctiva ENT:  lips & tongue are dry Neck: no LAD, masses or thyromegaly Cardiovascular: RRR, no m/r/g. No LE edema. Telemetry: SR, no arrhythmias  Respiratory: CTA bilaterally, no w/r/r. Normal respiratory effort. Abdomen: soft, epigastric tenderness not warm to touch, no rebound or guarding. Murphy sign negative. Skin: no rash or  induration seen on limited exam Musculoskeletal: grossly normal tone BUE/BLE Psychiatric: grossly normal mood and affect, speech fluent and appropriate Neurologic: grossly non-focal.          Labs on Admission:  Basic Metabolic Panel:  Recent Labs Lab 09/15/14 1109  NA 140  K 4.1  CL 102  CO2 23  GLUCOSE 89  BUN 10  CREATININE 0.61  CALCIUM 9.1   Liver Function Tests:  Recent Labs Lab 09/15/14 1109  AST 27  ALT 34  ALKPHOS 120*  BILITOT 0.4  PROT 8.2  ALBUMIN 3.8    Recent Labs Lab 09/15/14 1109  LIPASE 17   No results for input(s): AMMONIA in the last 168 hours. CBC:  Recent Labs Lab 09/15/14 1109  WBC 7.1  NEUTROABS 5.6  HGB 13.0  HCT 39.3  MCV 90.3  PLT 264   Cardiac Enzymes: No results for input(s): CKTOTAL, CKMB, CKMBINDEX, TROPONINI in the last 168 hours.  BNP (last 3 results) No results for input(s): PROBNP in the last 8760 hours. CBG: No results for input(s): GLUCAP in the last 168 hours.  Radiological Exams on Admission: Ct Abdomen Pelvis Wo Contrast  09/15/2014   CLINICAL DATA:  Abdominal pain above the umbilical area.  Diarrhea and vomiting. History of partial gastrectomy for cancerous tumor (10/2013).  EXAM: CT ABDOMEN AND PELVIS WITHOUT CONTRAST  TECHNIQUE: Multidetector CT imaging of the abdomen and pelvis was performed following the standard protocol without IV contrast.  COMPARISON:  CT abdomen pelvis -07/19/2013; 03/16/2013; CT guided subcutaneous tissue aspiration -03/19/2013.  FINDINGS: The lack of intravenous contrast limits the ability to evaluate solid abdominal organs. Examination is further degraded due to patient body habitus.  Normal hepatic contour.  Post cholecystectomy.  No ascites.  Normal noncontrast appearance of the bilateral kidneys. No renal stones. No urinary obstruction or perinephric stranding. Normal noncontrast appearance of the bilateral adrenal glands, pancreas and spleen.  The patient is undergone resection of previously noted exophytic mass arising from the gastric fundus. No evidence of residual or recurrent disease on this noncontrast examination. The bowel is otherwise normal in course and caliber without wall thickening or evidence of obstruction. The appendix is not visualized however there are no definitive inflammatory change within the right lower abdominal quadrant on this noncontrast examination. No pneumoperitoneum, pneumatosis or portal venous gas.  Normal caliber the abdominal aorta. Shotty retroperitoneal lymph nodes appear unchanged an well numerous are individually not enlarged by size criteria with index left-sided periaortic lymph node measuring 0.6 cm in greatest short axis diameter (image 44, series 21) and presumably due to patient body habitus. No definitive worsening bulky retroperitoneal, mesenteric, pelvic or inguinal lymphadenopathy on this noncontrast examination.  Post hysterectomy. No definitive adnexal lesions on this noncontrast examination. No free fluid in the pelvic cul-de-sac. Normal appearance of the urinary bladder given degree distention.  Limited visualization  of lower thorax is negative for focal airspace opacity or pleural effusion. There is minimal subsegmental atelectasis within the imaged caudal aspect of the right middle lobe. No pleural effusion.  Normal heart size.  No pericardial effusion.  Mild to moderate multilevel lumbar spine DDD, worse at L4-L5 with disc space height loss, endplate irregularity and sclerosis.  There is a minimal amount of thickening within the subcutaneous tissues about the inferior aspect of the lower ventral abdominal wall/pannus (image 66, series 21) without development of a recurrent seroma. Regional soft tissues appear otherwise normal. Specifically, no evidence of a periumbilical hernia.  IMPRESSION: 1. No explanation for  patient's periumbilical abdominal pain. 2. Interval resection of previously noted exophytic mass arising from the gastric fundus. No definitive evidence of residual or recurrent disease on this noncontrast examination. 3. Persistent minimal subcutaneous thickening about the inferior aspect of the ventral abdominal wall/pannus without evidence of a recurrent abdominal wall seroma / abscess.   Electronically Signed   By: Sandi Mariscal M.D.   On: 09/15/2014 14:20    EKG: Independently reviewed. None  Assessment/Plan Intractable nausea and vomiting/Epigastric pain/Diarrhea - She has had no fevers at home no fevers here in the hospital she has no leukocytosis. She has no rebound or guarding on her physical exam she was recently in a cruise and due to her ongoing chemotherapy this puts her at risk of contracting viral as well as bacterial gastrointestinal infections. - There is most likely a viral gastroenteritis. There is no blood in stool she has no fevers, I will not start empiric antibiotics at this time. She has no fever no leukocytosis if she develops fevers overnight she could be started on Cipro Flagyl. But at this time there is no signs of bacterial infection.  - Zofran for nausea, fever.  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 We'll started in the morning.    Code Status: full DVT Prophylaxis:heparin Family Communication: mother Disposition Plan: observation  Time spent: 40 minutes  Charlynne Cousins [facility] Pager [phone]

## 2014-09-15 NOTE — ED Notes (Signed)
Patient states that she can not have any Acetomorphine due to Chemotherapy treatment.

## 2014-09-15 NOTE — ED Notes (Signed)
Called CT informed that patient is not to have contrast per [place] PA

## 2014-09-15 NOTE — Progress Notes (Addendum)
6:05PM: Dr. Aileen Fass notified of patient's BP 80/35 HR 92. Pt very dizzy and light headed.   6:14 PM  BP rechecked, 99/59 HR 93  6:21 PM MD paged to clarify diet order. Order NPO, but has PO meds. Orders received.

## 2014-09-15 NOTE — ED Provider Notes (Signed)
CSN: [id number]     Arrival date & time 09/15/14  1022 History   First MD Initiated Contact with Patient 09/15/14 1110     Chief Complaint  Patient presents with  . Abdominal Pain  . Diarrhea     (Consider location/radiation/quality/duration/timing/severity/associated sxs/prior Treatment) HPI Comments: Stacy Hill is a 43 y.o. female with a PMHx of fibroids s/p hysterectomy, GERD, seasonal allergies, anemia, RLS, stromal tumor of digestive system, and GIST cancer, who presents to the ED with complaints of sudden onset severe 10/10 epigastric pain, sharp and nonradiating, intermittent, that began last night, worse with sitting upright, and without known alleviating factors given that she has not tried anything prior to arrival. Associated symptoms include watery diarrhea, stating that she has had 10 episodes since awakening this morning, and reports that the diarrhea comes on after the pain occurs. She also endorses nausea. She recently returned from a cruise, but denies any known sick contacts. She is currently undergoing chemotherapy for her GIST tumor, oncologist at [facility] Dr. Angelina Ok. She denies fevers, chills, CP, SOB, vomiting, hemetemesis, obstipation, constipation, hematochezia, melena, urinary or vaginal symptoms, weakness, or numbness. States she's getting over a URI from before her cruise.    Patient is a 43 y.o. female presenting with abdominal pain. The history is provided by the patient. No language interpreter was used.  Abdominal Pain Pain location:  Epigastric Pain quality: sharp   Pain radiates to:  Does not radiate Pain severity:  Severe (10/10) Onset quality:  Sudden Duration:  1 day Timing:  Intermittent Progression:  Worsening Chronicity:  New Context: recent travel   Context: not alcohol use   Relieved by:  None tried Worsened by:  Position changes (sitting) Ineffective treatments:  None tried Associated symptoms: diarrhea and nausea   Associated symptoms: no  chest pain, no chills, no constipation, no cough, no dysuria, no fever, no flatus, no hematemesis, no hematochezia, no hematuria, no melena, no shortness of breath, no vaginal bleeding, no vaginal discharge and no vomiting   Risk factors: multiple surgeries and obesity     Past Medical History  Diagnosis Date  . Fibroid   . Depression   . Seasonal allergies   . Shortness of breath     On exhertion,recently,with anemia per pt.  . Anemia   . Headache(784.0)   . GERD (gastroesophageal reflux disease)     Takes Tums prn.  . Restless leg syndrome   . Anxiety     Last visit to [facility] 2008 gave birth to stillborn, pt teary today discussing  . Stromal tumor of digestive system   . Cancer   . Gastrointestinal stromal tumor (GIST)    Past Surgical History  Procedure Laterality Date  . Cholecystectomy    . Cesarean section    . Abdominal hysterectomy N/A 02/13/2013    Procedure: HYSTERECTOMY ABDOMINAL WITH BILATERAL SALPINGECTOMY WITH EXTENSIVE LYSIS OF ADHESIONS;  Surgeon: Princess Bruins, MD;  Location: [facility];  Service: Gynecology;  Laterality: N/A;   Family History  Problem Relation Age of Onset  . Cancer Mother     Bladder cancer  . Hypertension Mother     Living, 72  . COPD Mother   . Hypertension Sister   . Heart attack Maternal Grandfather   . Other Father     Died, 80s   History  Substance Use Topics  . Smoking status: Never Smoker   . Smokeless tobacco: Never Used  . Alcohol Use: Yes     Comment: rare  OB History    Gravida Para Term Preterm AB TAB SAB Ectopic Multiple Living   '2 2 1 1      1     ' Review of Systems  Constitutional: Negative for fever and chills.  HENT: Negative for congestion and rhinorrhea.   Respiratory: Negative for cough and shortness of breath.   Cardiovascular: Negative for chest pain.  Gastrointestinal: Positive for nausea, abdominal pain and diarrhea. Negative for vomiting, constipation, blood in stool, melena, hematochezia, abdominal  distention, rectal pain, flatus and hematemesis.  Genitourinary: Negative for dysuria, urgency, frequency, hematuria, flank pain, vaginal bleeding and vaginal discharge.  Musculoskeletal: Negative for myalgias, back pain and arthralgias.  Skin: Negative for color change.  Allergic/Immunologic: Positive for immunocompromised state (on chemo).  Neurological: Negative for dizziness, weakness, numbness and headaches.   10 Systems reviewed and are negative for acute change except as noted in the HPI.    Allergies  Chocolate flavor; Ivp dye; Shellfish allergy; and Strawberry  Home Medications   Prior to Admission medications   Medication Sig Start Date End Date Taking? Authorizing Provider  albuterol (PROAIR HFA) 108 (90 BASE) MCG/ACT inhaler Inhale 1 puff into the lungs every 6 (six) hours as needed for wheezing or shortness of breath.  10/30/13  Yes Historical Provider, MD  cetirizine (ZYRTEC) 10 MG tablet Take 20 mg by mouth daily as needed (for itching).    Yes Historical Provider, MD  dextromethorphan-guaiFENesin (MUCINEX DM) 30-600 MG per 12 hr tablet Take 1 tablet by mouth 2 (two) times daily. 09/03/14  Yes Harvie Heck, PA-C  diphenhydrAMINE (BENADRYL) 25 MG tablet Take 100 mg by mouth at bedtime as needed for itching.    Yes Historical Provider, MD  FLUoxetine (PROZAC) 40 MG capsule Take 1 capsule (40 mg total) by mouth daily. 05/13/14  Yes Yvonne R Lowne, DO  ibuprofen (ADVIL,MOTRIN) 200 MG tablet Take 800 mg by mouth every 6 (six) hours as needed for moderate pain.   Yes Historical Provider, MD  imatinib (GLEEVEC) 400 MG tablet Take 400 mg by mouth daily. Take with meals and large glass of water.Caution:Chemotherapy.   Yes Historical Provider, MD  omeprazole (PRILOSEC) 20 MG capsule Take 20 mg by mouth daily.  03/04/14  Yes Historical Provider, MD  prochlorperazine (COMPAZINE) 10 MG tablet Take 1 tablet by mouth every 6 (six) hours as needed for nausea or vomiting.  12/26/13  Yes  Historical Provider, MD  guaiFENesin (MUCINEX) 600 MG 12 hr tablet Take 1 tablet (600 mg total) by mouth 2 (two) times daily as needed for cough or to loosen phlegm. Patient not taking: Reported on 09/04/2014 08/29/14   Anderson Malta L Piepenbrink, PA-C   BP 105/54 mmHg  Pulse 110  Temp(Src) 98.1 F (36.7 C)  Resp 18  SpO2 98%  LMP 01/10/2013 Physical Exam  Constitutional: She is oriented to person, place, and time. She appears well-developed and well-nourished.  Non-toxic appearance. She appears distressed.  Initially tachycardic which resolved. Afebrile, appears very uncomfortable, crying in pain.  HENT:  Head: Normocephalic and atraumatic.  Mouth/Throat: Oropharynx is clear and moist and mucous membranes are normal.  Eyes: Conjunctivae and EOM are normal. Right eye exhibits no discharge. Left eye exhibits no discharge.  Neck: Normal range of motion. Neck supple.  Cardiovascular: Normal rate, regular rhythm, normal heart sounds and intact distal pulses.  Exam reveals no gallop and no friction rub.   No murmur heard. Initially tachycardic which resolved  Pulmonary/Chest: Effort normal and breath sounds normal. No respiratory  distress. She has no decreased breath sounds. She has no wheezes. She has no rhonchi. She has no rales.  Abdominal: Soft. Normal appearance and bowel sounds are normal. She exhibits no distension. There is tenderness in the epigastric area and periumbilical area. There is no rigidity, no rebound, no guarding, no CVA tenderness, no tenderness at McBurney's point and negative Murphy's sign.    Soft, very obese abdomen which limits exam. Nondistended, +BS throughout although difficult to auscultate due to body habitus, moderate TTP in epigastrum/periumbilical area, no r/g/r, neg murphy's, neg mcburney's, no CVA TTP  Musculoskeletal: Normal range of motion.  MAE x4 Gait steady  Neurological: She is alert and oriented to person, place, and time. She has normal strength. No  sensory deficit.  Skin: Skin is warm, dry and intact. No rash noted.  Psychiatric: She has a normal mood and affect.  Nursing note and vitals reviewed.   ED Course  Procedures (including critical care time) Labs Review Labs Reviewed  CBC WITH DIFFERENTIAL - Abnormal; Notable for the following:    Neutrophils Relative % 80 (*)    All other components within normal limits  COMPREHENSIVE METABOLIC PANEL - Abnormal; Notable for the following:    Alkaline Phosphatase 120 (*)    All other components within normal limits  LIPASE, BLOOD  GI PATHOGEN PANEL BY PCR, STOOL  URINALYSIS, ROUTINE W REFLEX MICROSCOPIC    Imaging Review Ct Abdomen Pelvis Wo Contrast  09/15/2014   CLINICAL DATA:  Abdominal pain above the umbilical area. Diarrhea and vomiting. History of partial gastrectomy for cancerous tumor (10/2013).  EXAM: CT ABDOMEN AND PELVIS WITHOUT CONTRAST  TECHNIQUE: Multidetector CT imaging of the abdomen and pelvis was performed following the standard protocol without IV contrast.  COMPARISON:  CT abdomen pelvis -07/19/2013; 03/16/2013; CT guided subcutaneous tissue aspiration -03/19/2013.  FINDINGS: The lack of intravenous contrast limits the ability to evaluate solid abdominal organs. Examination is further degraded due to patient body habitus.  Normal hepatic contour.  Post cholecystectomy.  No ascites.  Normal noncontrast appearance of the bilateral kidneys. No renal stones. No urinary obstruction or perinephric stranding. Normal noncontrast appearance of the bilateral adrenal glands, pancreas and spleen.  The patient is undergone resection of previously noted exophytic mass arising from the gastric fundus. No evidence of residual or recurrent disease on this noncontrast examination. The bowel is otherwise normal in course and caliber without wall thickening or evidence of obstruction. The appendix is not visualized however there are no definitive inflammatory change within the right lower  abdominal quadrant on this noncontrast examination. No pneumoperitoneum, pneumatosis or portal venous gas.  Normal caliber the abdominal aorta. Shotty retroperitoneal lymph nodes appear unchanged an well numerous are individually not enlarged by size criteria with index left-sided periaortic lymph node measuring 0.6 cm in greatest short axis diameter (image 44, series 21) and presumably due to patient body habitus. No definitive worsening bulky retroperitoneal, mesenteric, pelvic or inguinal lymphadenopathy on this noncontrast examination.  Post hysterectomy. No definitive adnexal lesions on this noncontrast examination. No free fluid in the pelvic cul-de-sac. Normal appearance of the urinary bladder given degree distention.  Limited visualization of lower thorax is negative for focal airspace opacity or pleural effusion. There is minimal subsegmental atelectasis within the imaged caudal aspect of the right middle lobe. No pleural effusion.  Normal heart size.  No pericardial effusion.  Mild to moderate multilevel lumbar spine DDD, worse at L4-L5 with disc space height loss, endplate irregularity and sclerosis.  There is  a minimal amount of thickening within the subcutaneous tissues about the inferior aspect of the lower ventral abdominal wall/pannus (image 66, series 21) without development of a recurrent seroma. Regional soft tissues appear otherwise normal. Specifically, no evidence of a periumbilical hernia.  IMPRESSION: 1. No explanation for patient's periumbilical abdominal pain. 2. Interval resection of previously noted exophytic mass arising from the gastric fundus. No definitive evidence of residual or recurrent disease on this noncontrast examination. 3. Persistent minimal subcutaneous thickening about the inferior aspect of the ventral abdominal wall/pannus without evidence of a recurrent abdominal wall seroma / abscess.   Electronically Signed   By: Sandi Mariscal M.D.   On: 09/15/2014 14:20     EKG  Interpretation None      MDM   Final diagnoses:  Abdominal pain  Epigastric abdominal pain  Gastroenteritis  Diarrhea, travelers'  Nausea vomiting and diarrhea    43 y.o. female with a PMHx of GIST cancer currently on chemo, here for abd pain and diarrhea. Abd exam demonstrating exquisite epigastric/periumbilical pain. Given hx of multiple surgeries, concern for obstruction or worsened tumor progression, therefore will obtain CT. Pt allergic to IV contrast and unsure if PO contrast is a problem therefore will proceed with noncontrast CT. Will obtain basic labs and stool sample, given recent travel this could be gastroenteritis. Will give fluids, antiemetics, and pain control and reassess shortly.   2:44 PM CBC w/diff unremarkable. CMP showing baseline elev in alk phos but otherwise WNL. Lipase WNL. GI stool pathogen panel in process. CT unremarkable for acute changes including obstruction or recurrent disease, although limited by noncontrast imaging. Pain improved after 15m morphine and nausea improved after zofran 836mODT and 2541mhenergan IV. Will PO challenge. I believe her pain is likely gastroenteritis from recent travel. Will tx as traveler's diarrhea given the quantity of stools, will start flagyl and cipro. Will have her f/up with her PCP in 1wk.   3:23 PM PO challenge completed and pt tolerating well. Pain and nausea continue to be controlled. Will d/c with previously outlined plan.   BP 101/44 mmHg  Pulse 80  Temp(Src) 98.1 F (36.7 C)  Resp 17  SpO2 100%  LMP 01/10/2013  Meds ordered this encounter  Medications  . sodium chloride 0.9 % bolus 1,000 mL    Sig:   . morphine 4 MG/ML injection 4 mg    Sig:   . ondansetron (ZOFRAN-ODT) disintegrating tablet 8 mg    Sig:   . promethazine (PHENERGAN) injection 25 mg    Sig:   . HYDROcodone-acetaminophen (NORCO) 5-325 MG per tablet    Sig: Take 1 tablet by mouth every 6 (six) hours as needed for severe pain.    Dispense:   10 tablet    Refill:  0    Order Specific Question:  Supervising Provider    Answer:  MILNoemi Chapel[[id number]] ciprofloxacin (CIPRO) 500 MG tablet    Sig: Take 1 tablet (500 mg total) by mouth 2 (two) times daily. One po bid x 7 days    Dispense:  14 tablet    Refill:  0    Order Specific Question:  Supervising Provider    Answer:  MILNoemi Chapel[[id number]] metroNIDAZOLE (FLAGYL) 500 MG tablet    Sig: Take 1 tablet (500 mg total) by mouth 3 (three) times daily. One po bid x 7 days    Dispense:  21 tablet    Refill:  0  Order Specific Question:  Supervising Provider    Answer:  Noemi Chapel D [[id number]]  . promethazine (PHENERGAN) 25 MG tablet    Sig: Take 1 tablet (25 mg total) by mouth every 6 (six) hours as needed for nausea or vomiting.    Dispense:  10 tablet    Refill:  0    Order Specific Question:  Supervising Provider    Answer:  Noemi Chapel D [[id number]]     Agapita Savarino Strupp Camprubi-Soms, PA-C 09/15/14 1523  ADDENDUM: Pt began vomiting approx 96mns after being discharged, just before leaving. Will attempt zofran again, but pt has no assistance at home and given that she failed PO challenge, will admit for intractable n/v.   4:55 PM Dr. FVenetia Constablereturning page and will admit pt. Please see his note for further documentation of care.  MPatty SermonsCStoneridge PVermont11/30/15 1[place] MD 09/15/14 [phone]

## 2014-09-15 NOTE — ED Notes (Signed)
Per pt sts abdominal pain and diarrhea since Saturday. sts recently got back from cruise.

## 2014-09-15 NOTE — ED Notes (Signed)
Patient drank a few sips of water and denies nausea.

## 2014-09-15 NOTE — Discharge Instructions (Signed)
Use phenergan as prescribed, as needed for nausea. Stay well hydrated with small sips of fluids throughout the day. Follow a BRAT (banana-rice-applesauce-toast) diet as described below for the next 24-48 hours. The 'BRAT' diet is suggested, then progress to diet as tolerated as symptoms abate. Call if bloody stools, persistent diarrhea, vomiting, fever or abdominal pain. TAKE ALL OF THE ANTIBIOTICS PRESCRIBED. Use motrin or norco as directed as needed for pain, but don't drive or operate machinery while taking norco. Follow up with your regular doctor in 1 week for recheck. Return to ER for changing or worsening of symptoms.  Food Choices to Help Relieve Diarrhea When you have diarrhea, the foods you eat and your eating habits are very important. Choosing the right foods and drinks can help relieve diarrhea. Also, because diarrhea can last up to 7 days, you need to replace lost fluids and electrolytes (such as sodium, potassium, and chloride) in order to help prevent dehydration.  WHAT GENERAL GUIDELINES DO I NEED TO FOLLOW?  Slowly drink 1 cup (8 oz) of fluid for each episode of diarrhea. If you are getting enough fluid, your urine will be clear or pale yellow.  Eat starchy foods. Some good choices include white rice, white toast, pasta, low-fiber cereal, baked potatoes (without the skin), saltine crackers, and bagels.  Avoid large servings of any cooked vegetables.  Limit fruit to two servings per day. A serving is  cup or 1 small piece.  Choose foods with less than 2 g of fiber per serving.  Limit fats to less than 8 tsp (38 g) per day.  Avoid fried foods.  Eat foods that have probiotics in them. Probiotics can be found in certain dairy products.  Avoid foods and beverages that may increase the speed at which food moves through the stomach and intestines (gastrointestinal tract). Things to avoid include:  High-fiber foods, such as dried fruit, raw fruits and vegetables, nuts, seeds, and  whole grain foods.  Spicy foods and high-fat foods.  Foods and beverages sweetened with high-fructose corn syrup, honey, or sugar alcohols such as xylitol, sorbitol, and mannitol. WHAT FOODS ARE RECOMMENDED? Grains White rice. White, [place], or pita breads (fresh or toasted), including plain rolls, buns, or bagels. White pasta. Saltine, soda, or graham crackers. Pretzels. Low-fiber cereal. Cooked cereals made with water (such as cornmeal, farina, or cream cereals). Plain muffins. Matzo. Melba toast. Zwieback.  Vegetables Potatoes (without the skin). Strained tomato and vegetable juices. Most well-cooked and canned vegetables without seeds. Tender lettuce. Fruits Cooked or canned applesauce, apricots, cherries, fruit cocktail, grapefruit, peaches, pears, or plums. Fresh bananas, apples without skin, cherries, grapes, cantaloupe, grapefruit, peaches, oranges, or plums.  Meat and Other Protein Products Baked or boiled chicken. Eggs. Tofu. Fish. Seafood. Smooth peanut butter. Ground or well-cooked tender beef, ham, veal, lamb, pork, or poultry.  Dairy Plain yogurt, kefir, and unsweetened liquid yogurt. Lactose-free milk, buttermilk, or soy milk. Plain hard cheese. Beverages Sport drinks. Clear broths. Diluted fruit juices (except prune). Regular, caffeine-free sodas such as ginger ale. Water. Decaffeinated teas. Oral rehydration solutions. Sugar-free beverages not sweetened with sugar alcohols. Other Bouillon, broth, or soups made from recommended foods.  The items listed above may not be a complete list of recommended foods or beverages. Contact your dietitian for more options. WHAT FOODS ARE NOT RECOMMENDED? Grains Whole grain, whole wheat, bran, or rye breads, rolls, pastas, crackers, and cereals. Wild or brown rice. Cereals that contain more than 2 g of fiber per serving. Corn tortillas  or taco shells. Cooked or dry oatmeal. Granola. Popcorn. Vegetables Raw vegetables. Cabbage, broccoli,  Brussels sprouts, artichokes, baked beans, beet greens, corn, kale, legumes, peas, sweet potatoes, and yams. Potato skins. Cooked spinach and cabbage. Fruits Dried fruit, including raisins and dates. Raw fruits. Stewed or dried prunes. Fresh apples with skin, apricots, mangoes, pears, raspberries, and strawberries.  Meat and Other Protein Products Chunky peanut butter. Nuts and seeds. Beans and lentils. Berniece Salines.  Dairy High-fat cheeses. Milk, chocolate milk, and beverages made with milk, such as milk shakes. Cream. Ice cream. Sweets and Desserts Sweet rolls, doughnuts, and sweet breads. Pancakes and waffles. Fats and Oils Butter. Cream sauces. Margarine. Salad oils. Plain salad dressings. Olives. Avocados.  Beverages Caffeinated beverages (such as coffee, tea, soda, or energy drinks). Alcoholic beverages. Fruit juices with pulp. Prune juice. Soft drinks sweetened with high-fructose corn syrup or sugar alcohols. Other Coconut. Hot sauce. Chili powder. Mayonnaise. Gravy. Cream-based or milk-based soups.  The items listed above may not be a complete list of foods and beverages to avoid. Contact your dietitian for more information. WHAT SHOULD I DO IF I BECOME DEHYDRATED? Diarrhea can sometimes lead to dehydration. Signs of dehydration include dark urine and dry mouth and skin. If you think you are dehydrated, you should rehydrate with an oral rehydration solution. These solutions can be purchased at pharmacies, retail stores, or online.  Drink -1 cup (120-240 mL) of oral rehydration solution each time you have an episode of diarrhea. If drinking this amount makes your diarrhea worse, try drinking smaller amounts more often. For example, drink 1-3 tsp (5-15 mL) every 5-10 minutes.  A general rule for staying hydrated is to drink 1-2 L of fluid per day. Talk to your health care provider about the specific amount you should be drinking each day. Drink enough fluids to keep your urine clear or pale  yellow. Document Released: 12/24/2003 Document Revised: 10/08/2013 Document Reviewed: 08/26/2013 [facility] Patient Information 2015 [place], [place]. This information is not intended to replace advice given to you by your health care provider. Make sure you discuss any questions you have with your health care provider.  Abdominal (belly) pain can be caused by many things. Your caregiver performed an examination and possibly ordered blood/urine tests and imaging (CT scan, x-rays, ultrasound). Many cases can be observed and treated at home after initial evaluation in the emergency department. Even though you are being discharged home, abdominal pain can be unpredictable. Therefore, you need a repeated exam if your pain does not resolve, returns, or worsens. Most patients with abdominal pain don't have to be admitted to the hospital or have surgery, but serious problems like appendicitis and gallbladder attacks can start out as nonspecific pain. Many abdominal conditions cannot be diagnosed in one visit, so follow-up evaluations are very important. SEEK IMMEDIATE MEDICAL ATTENTION IF YOU DEVELOP ANY OF THE FOLLOWING SYMPTOMS:  The pain does not go away or becomes severe.   A temperature above 101 develops.   Repeated vomiting occurs (multiple episodes).   The pain becomes localized to portions of the abdomen. The right side could possibly be appendicitis. In an adult, the left lower portion of the abdomen could be colitis or diverticulitis.   Blood is being passed in stools or vomit (bright red or black tarry stools).   Return also if you develop chest pain, difficulty breathing, dizziness or fainting, or become confused, poorly responsive, or inconsolable (young children).  The constipation stays for more than 4 days.   There is  belly (abdominal) or rectal pain.   You do not seem to be getting better.     Diarrhea Diarrhea is watery poop (stool). It can make you feel weak, tired, thirsty, or  give you a dry mouth (signs of dehydration). Watery poop is a sign of another problem, most often an infection. It often lasts 2-3 days. It can last longer if it is a sign of something serious. Take care of yourself as told by your doctor. HOME CARE   Drink 1 cup (8 ounces) of fluid each time you have watery poop.  Do not drink the following fluids:  Those that contain simple sugars (fructose, glucose, galactose, lactose, sucrose, maltose).  Sports drinks.  Fruit juices.  Whole milk products.  Sodas.  Drinks with caffeine (coffee, tea, soda) or alcohol.  Oral rehydration solution may be used if the doctor says it is okay. You may make your own solution. Follow this recipe:   - teaspoon table salt.   teaspoon baking soda.   teaspoon salt substitute containing potassium chloride.  1 tablespoons sugar.  1 liter (34 ounces) of water.  Avoid the following foods:  High fiber foods, such as raw fruits and vegetables.  Nuts, seeds, and whole grain breads and cereals.   Those that are sweetened with sugar alcohols (xylitol, sorbitol, mannitol).  Try eating the following foods:  Starchy foods, such as rice, toast, pasta, low-sugar cereal, oatmeal, baked potatoes, crackers, and bagels.  Bananas.  Applesauce.  Eat probiotic-rich foods, such as yogurt and milk products that are fermented.  Wash your hands well after each time you have watery poop.  Only take medicine as told by your doctor.  Take a warm bath to help lessen burning or pain from having watery poop. GET HELP RIGHT AWAY IF:   You cannot drink fluids without throwing up (vomiting).  You keep throwing up.  You have blood in your poop, or your poop looks black and tarry.  You do not pee (urinate) in 6-8 hours, or there is only a small amount of very dark pee.  You have belly (abdominal) pain that gets worse or stays in the same spot (localizes).  You are weak, dizzy, confused, or light-headed.  You  have a very bad headache.  Your watery poop gets worse or does not get better.  You have a fever or lasting symptoms for more than 2-3 days.  You have a fever and your symptoms suddenly get worse. MAKE SURE YOU:   Understand these instructions.  Will watch your condition.  Will get help right away if you are not doing well or get worse. Document Released: 03/21/2008 Document Revised: 02/17/2014 Document Reviewed: 06/10/2012 [facility] Patient Information 2015 [place], [place]. This information is not intended to replace advice given to you by your health care provider. Make sure you discuss any questions you have with your health care provider.  Nausea and Vomiting Nausea is a sick feeling that often comes before throwing up (vomiting). Vomiting is a reflex where stomach contents come out of your mouth. Vomiting can cause severe loss of body fluids (dehydration). Children and elderly adults can become dehydrated quickly, especially if they also have diarrhea. Nausea and vomiting are symptoms of a condition or disease. It is important to find the cause of your symptoms. CAUSES   Direct irritation of the stomach lining. This irritation can result from increased acid production (gastroesophageal reflux disease), infection, food poisoning, taking certain medicines (such as nonsteroidal anti-inflammatory drugs), alcohol use, or  tobacco use.  Signals from the brain.These signals could be caused by a headache, heat exposure, an inner ear disturbance, increased pressure in the brain from injury, infection, a tumor, or a concussion, pain, emotional stimulus, or metabolic problems.  An obstruction in the gastrointestinal tract (bowel obstruction).  Illnesses such as diabetes, hepatitis, gallbladder problems, appendicitis, kidney problems, cancer, sepsis, atypical symptoms of a heart attack, or eating disorders.  Medical treatments such as chemotherapy and radiation.  Receiving medicine that makes  you sleep (general anesthetic) during surgery. DIAGNOSIS Your caregiver may ask for tests to be done if the problems do not improve after a few days. Tests may also be done if symptoms are severe or if the reason for the nausea and vomiting is not clear. Tests may include:  Urine tests.  Blood tests.  Stool tests.  Cultures (to look for evidence of infection).  X-rays or other imaging studies. Test results can help your caregiver make decisions about treatment or the need for additional tests. TREATMENT You need to stay well hydrated. Drink frequently but in small amounts.You may wish to drink water, sports drinks, clear broth, or eat frozen ice pops or gelatin dessert to help stay hydrated.When you eat, eating slowly may help prevent nausea.There are also some antinausea medicines that may help prevent nausea. HOME CARE INSTRUCTIONS   Take all medicine as directed by your caregiver.  If you do not have an appetite, do not force yourself to eat. However, you must continue to drink fluids.  If you have an appetite, eat a normal diet unless your caregiver tells you differently.  Eat a variety of complex carbohydrates (rice, wheat, potatoes, bread), lean meats, yogurt, fruits, and vegetables.  Avoid high-fat foods because they are more difficult to digest.  Drink enough water and fluids to keep your urine clear or pale yellow.  If you are dehydrated, ask your caregiver for specific rehydration instructions. Signs of dehydration may include:  Severe thirst.  Dry lips and mouth.  Dizziness.  Dark urine.  Decreasing urine frequency and amount.  Confusion.  Rapid breathing or pulse. SEEK IMMEDIATE MEDICAL CARE IF:   You have blood or brown flecks (like coffee grounds) in your vomit.  You have black or bloody stools.  You have a severe headache or stiff neck.  You are confused.  You have severe abdominal pain.  You have chest pain or trouble breathing.  You do  not urinate at least once every 8 hours.  You develop cold or clammy skin.  You continue to vomit for longer than 24 to 48 hours.  You have a fever. MAKE SURE YOU:   Understand these instructions.  Will watch your condition.  Will get help right away if you are not doing well or get worse. Document Released: 10/03/2005 Document Revised: 12/26/2011 Document Reviewed: 03/02/2011 [facility] Patient Information 2015 [place], [place]. This information is not intended to replace advice given to you by your health care provider. Make sure you discuss any questions you have with your health care provider.  Viral Gastroenteritis Viral gastroenteritis is also known as stomach flu. This condition affects the stomach and intestinal tract. It can cause sudden diarrhea and vomiting. The illness typically lasts 3 to 8 days. Most people develop an immune response that eventually gets rid of the virus. While this natural response develops, the virus can make you quite ill. CAUSES  Many different viruses can cause gastroenteritis, such as rotavirus or noroviruses. You can catch one of these viruses  by consuming contaminated food or water. You may also catch a virus by sharing utensils or other personal items with an infected person or by touching a contaminated surface. SYMPTOMS  The most common symptoms are diarrhea and vomiting. These problems can cause a severe loss of body fluids (dehydration) and a body salt (electrolyte) imbalance. Other symptoms may include:  Fever.  Headache.  Fatigue.  Abdominal pain. DIAGNOSIS  Your caregiver can usually diagnose viral gastroenteritis based on your symptoms and a physical exam. A stool sample may also be taken to test for the presence of viruses or other infections. TREATMENT  This illness typically goes away on its own. Treatments are aimed at rehydration. The most serious cases of viral gastroenteritis involve vomiting so severely that you are not able to  keep fluids down. In these cases, fluids must be given through an intravenous line (IV). HOME CARE INSTRUCTIONS   Drink enough fluids to keep your urine clear or pale yellow. Drink small amounts of fluids frequently and increase the amounts as tolerated.  Ask your caregiver for specific rehydration instructions.  Avoid:  Foods high in sugar.  Alcohol.  Carbonated drinks.  Tobacco.  Juice.  Caffeine drinks.  Extremely hot or cold fluids.  Fatty, greasy foods.  Too much intake of anything at one time.  Dairy products until 24 to 48 hours after diarrhea stops.  You may consume probiotics. Probiotics are active cultures of beneficial bacteria. They may lessen the amount and number of diarrheal stools in adults. Probiotics can be found in yogurt with active cultures and in supplements.  Wash your hands well to avoid spreading the virus.  Only take over-the-counter or prescription medicines for pain, discomfort, or fever as directed by your caregiver. Do not give aspirin to children. Antidiarrheal medicines are not recommended.  Ask your caregiver if you should continue to take your regular prescribed and over-the-counter medicines.  Keep all follow-up appointments as directed by your caregiver. SEEK IMMEDIATE MEDICAL CARE IF:   You are unable to keep fluids down.  You do not urinate at least once every 6 to 8 hours.  You develop shortness of breath.  You notice blood in your stool or vomit. This may look like coffee grounds.  You have abdominal pain that increases or is concentrated in one small area (localized).  You have persistent vomiting or diarrhea.  You have a fever.  The patient is a child younger than 3 months, and he or she has a fever.  The patient is a child older than 3 months, and he or she has a fever and persistent symptoms.  The patient is a child older than 3 months, and he or she has a fever and symptoms suddenly get worse.  The patient is a  baby, and he or she has no tears when crying. MAKE SURE YOU:   Understand these instructions.  Will watch your condition.  Will get help right away if you are not doing well or get worse. Document Released: 10/03/2005 Document Revised: 12/26/2011 Document Reviewed: 07/20/2011 [facility] Patient Information 2015 [place], [place]. This information is not intended to replace advice given to you by your health care provider. Make sure you discuss any questions you have with your health care provider.

## 2014-09-15 NOTE — ED Notes (Signed)
Patient to CT.

## 2014-09-15 NOTE — ED Notes (Signed)
Patient on bedside commode

## 2014-09-16 ENCOUNTER — Encounter (HOSPITAL_COMMUNITY): Payer: Self-pay | Admitting: General Practice

## 2014-09-16 DIAGNOSIS — K529 Noninfective gastroenteritis and colitis, unspecified: Secondary | ICD-10-CM

## 2014-09-16 DIAGNOSIS — G43A1 Cyclical vomiting, intractable: Secondary | ICD-10-CM

## 2014-09-16 DIAGNOSIS — R197 Diarrhea, unspecified: Secondary | ICD-10-CM

## 2014-09-16 LAB — GI PATHOGEN PANEL BY PCR, STOOL
C DIFFICILE TOXIN A/B: NEGATIVE
Campylobacter by PCR: NEGATIVE
Cryptosporidium by PCR: NEGATIVE
E COLI 0157 BY PCR: NEGATIVE
E coli (ETEC) LT/ST: NEGATIVE
E coli (STEC): NEGATIVE
G lamblia by PCR: NEGATIVE
Norovirus GI/GII: POSITIVE
Rotavirus A by PCR: NEGATIVE
SALMONELLA BY PCR: NEGATIVE
SHIGELLA BY PCR: NEGATIVE

## 2014-09-16 LAB — COMPREHENSIVE METABOLIC PANEL
ALT: 21 U/L (ref 0–35)
AST: 18 U/L (ref 0–37)
Albumin: 2.9 g/dL — ABNORMAL LOW (ref 3.5–5.2)
Alkaline Phosphatase: 83 U/L (ref 39–117)
Anion gap: 13 (ref 5–15)
BUN: 10 mg/dL (ref 6–23)
CO2: 20 mEq/L (ref 19–32)
CREATININE: 0.55 mg/dL (ref 0.50–1.10)
Calcium: 7.7 mg/dL — ABNORMAL LOW (ref 8.4–10.5)
Chloride: 105 mEq/L (ref 96–112)
GLUCOSE: 86 mg/dL (ref 70–99)
Potassium: 3.5 mEq/L — ABNORMAL LOW (ref 3.7–5.3)
Sodium: 138 mEq/L (ref 137–147)
Total Bilirubin: 0.4 mg/dL (ref 0.3–1.2)
Total Protein: 6.2 g/dL (ref 6.0–8.3)

## 2014-09-16 LAB — CBC
HCT: 32.2 % — ABNORMAL LOW (ref 36.0–46.0)
Hemoglobin: 10.7 g/dL — ABNORMAL LOW (ref 12.0–15.0)
MCH: 30.2 pg (ref 26.0–34.0)
MCHC: 33.2 g/dL (ref 30.0–36.0)
MCV: 91 fL (ref 78.0–100.0)
PLATELETS: 219 10*3/uL (ref 150–400)
RBC: 3.54 MIL/uL — ABNORMAL LOW (ref 3.87–5.11)
RDW: 13.6 % (ref 11.5–15.5)
WBC: 4.8 10*3/uL (ref 4.0–10.5)

## 2014-09-16 LAB — HEMOGLOBIN A1C
Hgb A1c MFr Bld: 5.4 % (ref <5.7)
MEAN PLASMA GLUCOSE: 108 mg/dL (ref <117)

## 2014-09-16 MED ORDER — OXYCODONE HCL 5 MG PO TABS
5.0000 mg | ORAL_TABLET | ORAL | Status: DC | PRN
  Administered 2014-09-16 (×2): 10 mg via ORAL
  Administered 2014-09-17 – 2014-09-18 (×2): 5 mg via ORAL
  Filled 2014-09-16 (×2): qty 1
  Filled 2014-09-16 (×2): qty 2

## 2014-09-16 MED ORDER — POTASSIUM CHLORIDE CRYS ER 20 MEQ PO TBCR
40.0000 meq | EXTENDED_RELEASE_TABLET | Freq: Once | ORAL | Status: AC
  Administered 2014-09-16: 40 meq via ORAL
  Filled 2014-09-16: qty 2

## 2014-09-16 NOTE — Progress Notes (Signed)
[facility] PROGRESS NOTE  Stacy Hill OMV:[id number] DOB: 1971/09/29 DOA: 09/15/2014 PCP: Garnet Koyanagi, DO  Assessment/Plan:  Intractable nausea and vomiting/Epigastric pain/Diarrhea -Likely viral in etiology with no blood in stools, a febrile, and no leukocytosis- just returned from a recent cruise -CT abd- no evidence of acute illness.  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 on Gleevec since 12/2013 -Will hold Gleevec for now  Macrocytic anemia -likely dilutional- Hgb 10.7 -Monitor CBC in am  Hypokalemia -Replete with Kdur 45meq once -repeat BMET in am  Depression -stable no SI or hallucinations Continue Prozac 40mg  daily  GERD -Continue Protonix 40mg  daily  Morbid Obesity -BMI 67.8  DVT Prophylaxis Code Status: Full Family Communication: No family at bedside Disposition Plan: inpatient   Consultants:  None  Procedures:  None  Antibiotics:  None  HPI/Subjective: Stacy Hill is a 43 yo female with PMH of gastrointestinal stromal tumor s/p partial gasterectomy 10/2013 on imatinib since 12/2013, fibroids, depression and anemia that presented with nonradiating intermitten epigastric pain, nausea, and vomiting  for past 3 days. She then developed diarrhea for past 2 days. Patient just returned from a cruise ship. She states she recently has been sick with URI sxs.  She denies any other sick contacts, fever, chills, chest pain, sob, hematemesis, or melana. In ED, CT abd with no evidence of acute illness/or of residual or recurrent cancer.  She is admitted for further workup   c/o's of nausea, abd pain, and diarrhea after soft diet.   Objective: Filed Vitals:   09/16/14 0622  BP: 135/116  Pulse: 87  Temp: 98.8 F (37.1 C)  Resp: 16     Intake/Output Summary (Last 24 hours) at 09/16/14 1447 Last data filed at 09/16/14 0346  Gross per 24 hour  Intake 1136.67 ml  Output    400 ml  Net 736.67 ml   Filed Weights   09/15/14 1800  Weight: 167.786 kg (369 lb 14.4 oz)    Exam:  Gen: Alert and oriented obese AA female in NAD HEENT: Normocephalic, atraumatic.  Pupils symmertrical. dry mucosa.   Chest: clear to auscultate bilaterally, no ronchi or rales  Cardiac: Regular rate and rhythm, S1-S2, no rubs murmurs or gallops  Abdomen: soft, non tender, non distended, +bowel sounds. No guarding or rigidity  Extremities: Symmetrical in appearance without cyanosis or edema  Neurological: Alert awake oriented to time place and person.  Psychiatric: Appears normal.   Data Reviewed: Basic Metabolic Panel:  Recent Labs Lab 09/15/14 1109 09/16/14 0400  NA 140 138  K 4.1 3.5*  CL 102 105  CO2 23 20  GLUCOSE 89 86  BUN 10 10  CREATININE 0.61 0.55  CALCIUM 9.1 7.7*   Liver Function Tests:  Recent Labs Lab 09/15/14 1109 09/16/14 0400  AST 27 18  ALT 34 21  ALKPHOS 120* 83  BILITOT 0.4 0.4  PROT 8.2 6.2  ALBUMIN 3.8 2.9*    Recent Labs Lab 09/15/14 1109  LIPASE 17   No results for input(s): AMMONIA in the last 168 hours. CBC:  Recent Labs Lab 09/15/14 1109 09/16/14 0400  WBC 7.1 4.8  NEUTROABS 5.6  --   HGB 13.0 10.7*  HCT 39.3 32.2*  MCV 90.3 91.0  PLT 264 219   Cardiac Enzymes: No results for input(s): CKTOTAL, CKMB, CKMBINDEX, TROPONINI in the  last 168 hours. BNP (last 3 results) No results for input(s): PROBNP in the last 8760 hours. CBG: No results for input(s): GLUCAP in the last 168 hours.  No results found for this or any previous visit (from the past 240 hour(s)).   Studies: Ct Abdomen Pelvis Wo Contrast  09/15/2014   CLINICAL DATA:  Abdominal pain above the umbilical area. Diarrhea and vomiting. History of partial gastrectomy for cancerous tumor (10/2013).  EXAM: CT ABDOMEN AND  PELVIS WITHOUT CONTRAST  TECHNIQUE: Multidetector CT imaging of the abdomen and pelvis was performed following the standard protocol without IV contrast.  COMPARISON:  CT abdomen pelvis -07/19/2013; 03/16/2013; CT guided subcutaneous tissue aspiration -03/19/2013.  FINDINGS: The lack of intravenous contrast limits the ability to evaluate solid abdominal organs. Examination is further degraded due to patient body habitus.  Normal hepatic contour.  Post cholecystectomy.  No ascites.  Normal noncontrast appearance of the bilateral kidneys. No renal stones. No urinary obstruction or perinephric stranding. Normal noncontrast appearance of the bilateral adrenal glands, pancreas and spleen.  The patient is undergone resection of previously noted exophytic mass arising from the gastric fundus. No evidence of residual or recurrent disease on this noncontrast examination. The bowel is otherwise normal in course and caliber without wall thickening or evidence of obstruction. The appendix is not visualized however there are no definitive inflammatory change within the right lower abdominal quadrant on this noncontrast examination. No pneumoperitoneum, pneumatosis or portal venous gas.  Normal caliber the abdominal aorta. Shotty retroperitoneal lymph nodes appear unchanged an well numerous are individually not enlarged by size criteria with index left-sided periaortic lymph node measuring 0.6 cm in greatest short axis diameter (image 44, series 21) and presumably due to patient body habitus. No definitive worsening bulky retroperitoneal, mesenteric, pelvic or inguinal lymphadenopathy on this noncontrast examination.  Post hysterectomy. No definitive adnexal lesions on this noncontrast examination. No free fluid in the pelvic cul-de-sac. Normal appearance of the urinary bladder given degree distention.  Limited visualization of lower thorax is negative for focal airspace opacity or pleural effusion. There is minimal subsegmental  atelectasis within the imaged caudal aspect of the right middle lobe. No pleural effusion.  Normal heart size.  No pericardial effusion.  Mild to moderate multilevel lumbar spine DDD, worse at L4-L5 with disc space height loss, endplate irregularity and sclerosis.  There is a minimal amount of thickening within the subcutaneous tissues about the inferior aspect of the lower ventral abdominal wall/pannus (image 66, series 21) without development of a recurrent seroma. Regional soft tissues appear otherwise normal. Specifically, no evidence of a periumbilical hernia.  IMPRESSION: 1. No explanation for patient's periumbilical abdominal pain. 2. Interval resection of previously noted exophytic mass arising from the gastric fundus. No definitive evidence of residual or recurrent disease on this noncontrast examination. 3. Persistent minimal subcutaneous thickening about the inferior aspect of the ventral abdominal wall/pannus without evidence of a recurrent abdominal wall seroma / abscess.   Electronically Signed   By: Sandi Mariscal M.D.   On: 09/15/2014 14:20    Scheduled Meds: . dextromethorphan-guaiFENesin  1 tablet Oral BID  . FLUoxetine  40 mg Oral Daily  . heparin  5,000 Units Subcutaneous 3 times per day  . imatinib  400 mg Oral Q breakfast  . loratadine  10 mg Oral Daily  . pantoprazole  40 mg Oral Daily  . potassium chloride  40 mEq Oral Once   Continuous Infusions: . sodium chloride 100 mL/hr at 09/15/14 1900  .  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 If 7PM-7AM, please contact night-coverage at [email or web address], password [facility] 09/16/2014, 2:47 PM  LOS: 1 day

## 2014-09-16 NOTE — Progress Notes (Signed)
UR completed 

## 2014-09-17 DIAGNOSIS — J302 Other seasonal allergic rhinitis: Secondary | ICD-10-CM | POA: Diagnosis present

## 2014-09-17 DIAGNOSIS — A0811 Acute gastroenteropathy due to Norwalk agent: Secondary | ICD-10-CM | POA: Diagnosis present

## 2014-09-17 DIAGNOSIS — Z90710 Acquired absence of both cervix and uterus: Secondary | ICD-10-CM | POA: Diagnosis not present

## 2014-09-17 DIAGNOSIS — Z91013 Allergy to seafood: Secondary | ICD-10-CM | POA: Diagnosis not present

## 2014-09-17 DIAGNOSIS — K219 Gastro-esophageal reflux disease without esophagitis: Secondary | ICD-10-CM | POA: Diagnosis present

## 2014-09-17 DIAGNOSIS — I951 Orthostatic hypotension: Secondary | ICD-10-CM

## 2014-09-17 DIAGNOSIS — F419 Anxiety disorder, unspecified: Secondary | ICD-10-CM | POA: Diagnosis present

## 2014-09-17 DIAGNOSIS — Z9049 Acquired absence of other specified parts of digestive tract: Secondary | ICD-10-CM | POA: Diagnosis present

## 2014-09-17 DIAGNOSIS — E876 Hypokalemia: Secondary | ICD-10-CM | POA: Diagnosis present

## 2014-09-17 DIAGNOSIS — R42 Dizziness and giddiness: Secondary | ICD-10-CM | POA: Insufficient documentation

## 2014-09-17 DIAGNOSIS — A0819 Acute gastroenteropathy due to other small round viruses: Secondary | ICD-10-CM | POA: Diagnosis present

## 2014-09-17 DIAGNOSIS — D649 Anemia, unspecified: Secondary | ICD-10-CM | POA: Diagnosis present

## 2014-09-17 DIAGNOSIS — A084 Viral intestinal infection, unspecified: Secondary | ICD-10-CM | POA: Diagnosis present

## 2014-09-17 DIAGNOSIS — Z91018 Allergy to other foods: Secondary | ICD-10-CM | POA: Diagnosis not present

## 2014-09-17 DIAGNOSIS — G2581 Restless legs syndrome: Secondary | ICD-10-CM | POA: Diagnosis present

## 2014-09-17 DIAGNOSIS — Z79899 Other long term (current) drug therapy: Secondary | ICD-10-CM | POA: Diagnosis not present

## 2014-09-17 DIAGNOSIS — Z6841 Body Mass Index (BMI) 40.0 and over, adult: Secondary | ICD-10-CM | POA: Diagnosis not present

## 2014-09-17 DIAGNOSIS — Z903 Acquired absence of stomach [part of]: Secondary | ICD-10-CM | POA: Diagnosis present

## 2014-09-17 DIAGNOSIS — R112 Nausea with vomiting, unspecified: Secondary | ICD-10-CM

## 2014-09-17 DIAGNOSIS — F329 Major depressive disorder, single episode, unspecified: Secondary | ICD-10-CM | POA: Diagnosis present

## 2014-09-17 DIAGNOSIS — D539 Nutritional anemia, unspecified: Secondary | ICD-10-CM | POA: Diagnosis present

## 2014-09-17 DIAGNOSIS — R197 Diarrhea, unspecified: Secondary | ICD-10-CM

## 2014-09-17 DIAGNOSIS — C494 Malignant neoplasm of connective and soft tissue of abdomen: Secondary | ICD-10-CM | POA: Diagnosis present

## 2014-09-17 DIAGNOSIS — Z91041 Radiographic dye allergy status: Secondary | ICD-10-CM | POA: Diagnosis not present

## 2014-09-17 LAB — BASIC METABOLIC PANEL
Anion gap: 13 (ref 5–15)
BUN: 4 mg/dL — AB (ref 6–23)
CHLORIDE: 110 meq/L (ref 96–112)
CO2: 18 mEq/L — ABNORMAL LOW (ref 19–32)
CREATININE: 0.48 mg/dL — AB (ref 0.50–1.10)
Calcium: 7.8 mg/dL — ABNORMAL LOW (ref 8.4–10.5)
GFR calc non Af Amer: >90 mL/min (ref >90)
Glucose, Bld: 74 mg/dL (ref 70–99)
Potassium: 4 mEq/L (ref 3.7–5.3)
Sodium: 141 mEq/L (ref 137–147)

## 2014-09-17 LAB — CBC
HCT: 32.7 % — ABNORMAL LOW (ref 36.0–46.0)
Hemoglobin: 10.6 g/dL — ABNORMAL LOW (ref 12.0–15.0)
MCH: 29.4 pg (ref 26.0–34.0)
MCHC: 32.4 g/dL (ref 30.0–36.0)
MCV: 90.6 fL (ref 78.0–100.0)
Platelets: 213 10*3/uL (ref 150–400)
RBC: 3.61 MIL/uL — ABNORMAL LOW (ref 3.87–5.11)
RDW: 13.9 % (ref 11.5–15.5)
WBC: 5 10*3/uL (ref 4.0–10.5)

## 2014-09-17 MED ORDER — SODIUM CHLORIDE 0.9 % IV SOLN
INTRAVENOUS | Status: DC
  Administered 2014-09-17 – 2014-09-18 (×2): via INTRAVENOUS

## 2014-09-17 MED ORDER — DIPHENHYDRAMINE HCL 25 MG PO CAPS
25.0000 mg | ORAL_CAPSULE | Freq: Once | ORAL | Status: AC
  Administered 2014-09-17: 25 mg via ORAL
  Filled 2014-09-17: qty 1

## 2014-09-17 MED ORDER — SODIUM BICARBONATE 650 MG PO TABS
650.0000 mg | ORAL_TABLET | Freq: Two times a day (BID) | ORAL | Status: DC
  Administered 2014-09-17 – 2014-09-18 (×2): 650 mg via ORAL
  Filled 2014-09-17 (×4): qty 1

## 2014-09-17 MED ORDER — SODIUM CHLORIDE 0.9 % IV BOLUS (SEPSIS)
1000.0000 mL | Freq: Once | INTRAVENOUS | Status: AC
  Administered 2014-09-17: 1000 mL via INTRAVENOUS

## 2014-09-17 MED ORDER — LOPERAMIDE HCL 2 MG PO CAPS: 2.0000 mg | ORAL_CAPSULE | ORAL | Status: DC | PRN

## 2014-09-17 NOTE — Plan of Care (Signed)
Problem: Phase I Progression Outcomes Goal: Pain controlled with appropriate interventions Outcome: Completed/Met Date Met:  09/17/14 Goal: OOB as tolerated unless otherwise ordered Outcome: Progressing Goal: Initial discharge plan identified Outcome: Completed/Met Date Met:  09/17/14 Goal: Voiding-avoid urinary catheter unless indicated Outcome: Completed/Met Date Met:  09/17/14  Problem: Phase II Progression Outcomes Goal: Discharge plan established Outcome: Progressing Goal: Obtain order to discontinue catheter if appropriate Outcome: Not Applicable Date Met:  00/86/76  Problem: Phase III Progression Outcomes Goal: Pain controlled on oral analgesia Outcome: Not Applicable Date Met:  19/50/93 Goal: Activity at appropriate level-compared to baseline (UP IN CHAIR FOR HEMODIALYSIS)  Outcome: Progressing  Problem: Discharge Progression Outcomes Goal: Discharge plan in place and appropriate Outcome: Progressing Goal: Pain controlled with appropriate interventions Outcome: Completed/Met Date Met:  09/17/14

## 2014-09-17 NOTE — Plan of Care (Signed)
Problem: Phase I Progression Outcomes Goal: OOB as tolerated unless otherwise ordered Outcome: Completed/Met Date Met:  09/17/14 Goal: Hemodynamically stable Outcome: Completed/Met Date Met:  09/17/14  Problem: Phase III Progression Outcomes Goal: Activity at appropriate level-compared to baseline (UP IN CHAIR FOR HEMODIALYSIS)  Outcome: Completed/Met Date Met:  09/17/14 Goal: Voiding independently Outcome: Completed/Met Date Met:  09/17/14 Goal: Discharge plan remains appropriate-arrangements made Outcome: Completed/Met Date Met:  09/17/14     

## 2014-09-17 NOTE — Progress Notes (Signed)
[facility] PROGRESS NOTE  Stacy Hill YTK:[id number] DOB: 02/05/1971 DOA: 09/15/2014 PCP: Garnet Koyanagi, DO  Assessment/Plan:  Gastroenteritis due to Noravirus -Presented with n/v/ diarrhea, pt recently just returned from acruise.  She remained a febrile, no leukocytosis. Has progressed well with no recurrence of symptoms.  -Gastric panel positive for Noravirus -CT abd- no evidence of acute illness.  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 on [place] since 12/2013 -Will hold Gleevec for now  Macrocytic anemia -Likely dilutional- with no overt/history of bleed - Hgb 10.7 -Monitor CBC in am  Hypokalemia -Replete with Kdur 66meq once -repeat BMET in am  Depression -stable no SI or hallucinations -Continue Prozac 40mg  daily  GERD -Continue Protonix 40mg  daily  Morbid Obesity -BMI 67.8   DVT Prophylaxis SQ Heparin Code Status: Full Family Communication: No family at bedside Disposition Plan: Home when stable   Consultants:  None  Procedures:  None  Antibiotics:  None  HPI/Subjective:  Stacy Hill is a 43 yo female with PMH of gastrointestinal stromal tumor s/p partial gasterectomy 10/2013 on imatinib since 12/2013, fibroids, depression and anemia that presented with nonradiating intermitten epigastric pain, nausea, and vomiting for past 3 days. She then developed diarrhea for past 2 days. Patient just returned from a cruise ship. She states she recently has been sick with URI sxs. She denies any other sick contacts, fever, chills, chest pain, sob, hematemesis, or melana. In ED, CT abd with no evidence of  acute illness/or of residual or recurrent cancer. She is admitted for further workup.  Denies any  n/v/diarrhea after soft diet. C/o's of dizziness.  Objective: Filed Vitals:   09/17/14 1518  BP: 102/78  Pulse:   Temp:   Resp:     Intake/Output Summary (Last 24 hours) at 09/17/14 1604 Last data filed at 09/17/14 5573  Gross per 24 hour  Intake   3505 ml  Output      0 ml  Net   3505 ml   Filed Weights   09/15/14 1800  Weight: 167.786 kg (369 lb 14.4 oz)    Exam:  Gen: Alert and oriented obese AA female in NAD HEENT: Normocephalic, atraumatic.  Pupils symmertrical.  Moist mucosa.   Chest: clear to auscultate bilaterally, no ronchi or rales  Cardiac: Regular rate and rhythm, S1-S2, no rubs murmurs or gallops  Abdomen: soft, non tender, non distended, +bowel sounds. No guarding or rigidity  Extremities: Symmetrical in appearance without cyanosis or edema  Neurological: Alert awake oriented to time place and person.  Psychiatric: Appears normal.   Data Reviewed: Basic Metabolic Panel:  Recent Labs Lab 09/15/14 1109 09/16/14 0400 09/17/14 0614  NA 140 138 141  K 4.1 3.5* 4.0  CL 102 105 110  CO2 23 20 18*  GLUCOSE 89 86 74  BUN 10 10 4*  CREATININE 0.61 0.55 0.48*  CALCIUM 9.1 7.7* 7.8*   Liver Function Tests:  Recent Labs Lab 09/15/14 1109 09/16/14 0400  AST 27 18  ALT 34 21  ALKPHOS 120* 83  BILITOT 0.4 0.4  PROT 8.2 6.2  ALBUMIN 3.8 2.9*    Recent Labs Lab 09/15/14 1109  LIPASE 17   No results for input(s): AMMONIA in the  last 168 hours. CBC:  Recent Labs Lab 09/15/14 1109 09/16/14 0400 09/17/14 0614  WBC 7.1 4.8 5.0  NEUTROABS 5.6  --   --   HGB 13.0 10.7* 10.6*  HCT 39.3 32.2* 32.7*  MCV 90.3 91.0 90.6  PLT 264 219 213   Cardiac Enzymes: No results for input(s): CKTOTAL, CKMB, CKMBINDEX, TROPONINI in the last 168 hours. BNP (last 3 results) No results for input(s): PROBNP in the last 8760 hours. CBG: No results for input(s):  GLUCAP in the last 168 hours.  No results found for this or any previous visit (from the past 240 hour(s)).   Studies: No results found.  Scheduled Meds: . dextromethorphan-guaiFENesin  1 tablet Oral BID  . FLUoxetine  40 mg Oral Daily  . heparin  5,000 Units Subcutaneous 3 times per day  . loratadine  10 mg Oral Daily  . pantoprazole  40 mg Oral Daily  . sodium bicarbonate  650 mg Oral BID  . sodium chloride  1,000 mL Intravenous Once   Continuous Infusions: . 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 If 7PM-7AM, please contact night-coverage at [email or web address], password [facility] 09/17/2014, 4:04 PM  LOS: 2 days

## 2014-09-18 ENCOUNTER — Inpatient Hospital Stay (HOSPITAL_COMMUNITY): Payer: BC Managed Care – PPO

## 2014-09-18 DIAGNOSIS — R1084 Generalized abdominal pain: Secondary | ICD-10-CM

## 2014-09-18 DIAGNOSIS — R109 Unspecified abdominal pain: Secondary | ICD-10-CM | POA: Insufficient documentation

## 2014-09-18 DIAGNOSIS — K319 Disease of stomach and duodenum, unspecified: Secondary | ICD-10-CM

## 2014-09-18 LAB — CBC
HCT: 31.4 % — ABNORMAL LOW (ref 36.0–46.0)
HEMOGLOBIN: 10.5 g/dL — AB (ref 12.0–15.0)
MCH: 31 pg (ref 26.0–34.0)
MCHC: 33.4 g/dL (ref 30.0–36.0)
MCV: 92.6 fL (ref 78.0–100.0)
PLATELETS: 211 10*3/uL (ref 150–400)
RBC: 3.39 MIL/uL — ABNORMAL LOW (ref 3.87–5.11)
RDW: 13.7 % (ref 11.5–15.5)
WBC: 5.6 10*3/uL (ref 4.0–10.5)

## 2014-09-18 LAB — BASIC METABOLIC PANEL
ANION GAP: 10 (ref 5–15)
BUN: 4 mg/dL — ABNORMAL LOW (ref 6–23)
CHLORIDE: 107 meq/L (ref 96–112)
CO2: 23 mEq/L (ref 19–32)
CREATININE: 0.51 mg/dL (ref 0.50–1.10)
Calcium: 8 mg/dL — ABNORMAL LOW (ref 8.4–10.5)
GFR calc non Af Amer: >90 mL/min (ref >90)
Glucose, Bld: 70 mg/dL (ref 70–99)
POTASSIUM: 3.7 meq/L (ref 3.7–5.3)
Sodium: 140 mEq/L (ref 137–147)

## 2014-09-18 MED ORDER — MECLIZINE HCL 25 MG PO TABS: 25.0000 mg | ORAL_TABLET | Freq: Three times a day (TID) | ORAL | Status: DC | PRN

## 2014-09-18 MED ORDER — IMATINIB MESYLATE 400 MG PO TABS: 400.0000 mg | ORAL_TABLET | Freq: Every day | ORAL | Status: DC

## 2014-09-18 MED ORDER — MECLIZINE HCL 25 MG PO TABS
25.0000 mg | ORAL_TABLET | Freq: Three times a day (TID) | ORAL | Status: DC | PRN
  Administered 2014-09-18: 25 mg via ORAL
  Filled 2014-09-18 (×3): qty 1

## 2014-09-18 NOTE — Care Management Note (Unsigned)
    Page 1 of 1   09/18/2014     12:42:08 PM CARE MANAGEMENT NOTE 09/18/2014  Patient:  Stacy Hill, Stacy Hill   Account Number:  [id number]  Date Initiated:  09/18/2014  Documentation initiated by:  Tomi Bamberger  Subjective/Objective Assessment:   dx n/v, norovirus, hypotension  admit- from home     Action/Plan:   pt /ot eval-   Anticipated DC Date:  09/18/2014   Anticipated DC Plan:  [place]  CM consult      Choice offered to / List presented to:             Status of service:  In process, will continue to follow Medicare Important Message given?  NO (If response is "NO", the following Medicare IM given date fields will be blank) Date Medicare IM given:   Medicare IM given by:   Date Additional Medicare IM given:   Additional Medicare IM given by:    Discharge Disposition:    Per UR Regulation:  Reviewed for med. necessity/level of care/duration of stay  If discussed at [place] of Stay Meetings, dates discussed:    Comments:  09/18/14 [place], BSN [phone] patient is for possible dc today, need orthostatics checked.  Await pt/ot eval.

## 2014-09-18 NOTE — Evaluation (Signed)
Physical Therapy Evaluation Patient Details Name: Stacy Hill MRN: [id number] DOB: September 09, 1971 Today's Date: 09/18/2014   History of Present Illness   Stacy Hill is a 43 y.o. female past medical history of fibroids with hysterectomy, also just tumor on [place], which recently returned from a cruise ship who comes into the ED complaining of sudden onset of abdominal pain nausea vomiting and diarrhea she was the pain is nonradiating intermittent in the epigastric area. Nothing makes it better or worse. She relates she's been having watery diarrhea since yesterday with no blood. She woke up this morning and still cannot tolerate anything by mouth swish came to the ED. She relates she has not taking anything at home or any remedies. She relates there is no sick contacts and she denies any fever, chills, chest pain, hematemesis, melena vaginal discharge. She denies any ulcerations in her skin or any vaginal discharge..  While here, pt related periods of dizziness with spinning and therapy asked to do a vestibular assessment.  Clinical Impression  Vestibular testing did not produce any definitive cause for pt's vertigo.  I speculate a potential cause may be due to her chemo meds.  At this time pt is basically safe and independent with no further PT needs.  Will sign off.    Follow Up Recommendations No PT follow up    Equipment Recommendations  None recommended by PT    Recommendations for Other Services       Precautions / Restrictions        Mobility  Bed Mobility Overal bed mobility: Modified Independent                Transfers Overall transfer level: Independent                  Ambulation/Gait Ambulation/Gait assistance: Independent Ambulation Distance (Feet): 50 Feet (with scanning and turns to attempt elliciting vertigo.) Assistive device: None Gait Pattern/deviations: WFL(Within Functional Limits)        Stairs            Wheelchair  Mobility    Modified Rankin (Stroke Patients Only)       Balance Overall balance assessment: No apparent balance deficits (not formally assessed);Needs assistance   Sitting balance-Leahy Scale: Normal       Standing balance-Leahy Scale: Normal                               Pertinent Vitals/Pain Pain Assessment: No/denies pain    Home Living Family/patient expects to be discharged to:: Private residence Living Arrangements: Children Available Help at Discharge: Family   Home Access: Stairs to enter Entrance Stairs-Rails: Psychiatric nurse of Steps: several Home Layout: One level        Prior Function Level of Independence: Independent               Hand Dominance        Extremity/Trunk Assessment   Upper Extremity Assessment: Overall WFL for tasks assessed           Lower Extremity Assessment: Overall WFL for tasks assessed         Communication   Communication: No difficulties  Cognition Arousal/Alertness: Awake/alert Behavior During Therapy: WFL for tasks assessed/performed Overall Cognitive Status: Within Functional Limits for tasks assessed                      General Comments General comments (skin integrity, edema,  etc.): Conducted basic vestibular assessment including questioning that led me to a potential peripheral cause, occulomotor testing showing no nystagmjus,  smooth saccades and pursuits and negative VOR/head thrust..  Also Modified Hallpike-Dix and supine head roll were negative for symtoms or nystagmus.  This therapist believes that pt's chemotherapy is a potential cause and at this point we have nothing else to offer this patient.    Exercises        Assessment/Plan    PT Assessment Patent does not need any further PT services  PT Diagnosis Other (comment) (recurrent vertigo)   PT Problem List    PT Treatment Interventions     PT Goals (Current goals can be found in the Care Plan  section) Acute Rehab PT Goals PT Goal Formulation: All assessment and education complete, DC therapy    Frequency     Barriers to discharge        Co-evaluation               End of Session   Activity Tolerance: Patient tolerated treatment well Patient left: in bed;with call bell/phone within reach;with family/visitor present Nurse Communication: Mobility status         Time: 1308-6578 PT Time Calculation (min) (ACUTE ONLY): 34 min   Charges:   PT Evaluation $Initial PT Evaluation Tier I: 1 Procedure PT Treatments $Therapeutic Activity: 8-22 mins $Neuromuscular Re-education: 8-22 mins   PT G Codes:          Sujey Gundry, Tessie Fass 09/18/2014, 4:36 PM  09/18/2014  Donnella Sham, [place] [phone]  (pager)

## 2014-09-18 NOTE — Plan of Care (Signed)
Problem: Phase II Progression Outcomes Goal: Discharge plan established Outcome: Completed/Met Date Met:  09/18/14 Goal: IV changed to normal saline lock Outcome: Completed/Met Date Met:  09/18/14 Goal: Other Phase II Outcomes/Goals Outcome: Progressing  Problem: Phase III Progression Outcomes Goal: IV/normal saline lock discontinued Outcome: Progressing Goal: Other Phase III Outcomes/Goals Outcome: Progressing  Problem: Discharge Progression Outcomes Goal: Discharge plan in place and appropriate Outcome: Progressing Goal: Complications resolved/controlled Outcome: Progressing Goal: Tolerating diet Outcome: Completed/Met Date Met:  09/18/14 Goal: Activity appropriate for discharge plan Outcome: Progressing Goal: Other Discharge Outcomes/Goals Outcome: Progressing

## 2014-09-18 NOTE — Progress Notes (Signed)
Pt given discharge paperwork and prescription.  PIV removed.  Pt escorted to discharge location with hospital staff.

## 2014-09-18 NOTE — Plan of Care (Signed)
Problem: Phase I Progression Outcomes Goal: Other Phase I Outcomes/Goals Outcome: Completed/Met Date Met:  09/18/14  Problem: Phase II Progression Outcomes Goal: Progress activity as tolerated unless otherwise ordered Outcome: Completed/Met Date Met:  09/18/14 Goal: Discharge plan established Outcome: Progressing Goal: Vital signs remain stable Outcome: Completed/Met Date Met:  09/18/14  Problem: Discharge Progression Outcomes Goal: Discharge plan in place and appropriate Outcome: Progressing Goal: Hemodynamically stable Outcome: Completed/Met Date Met:  09/18/14

## 2014-09-18 NOTE — Discharge Summary (Signed)
Physician Discharge Summary  Stacy Hill ZOX:[id number] DOB: Aug 15, 1971 DOA: 09/15/2014  PCP: Garnet Koyanagi, DO  Admit date: 09/15/2014 Discharge date: 09/18/2014  Time spent: 60 minutes  Recommendations for Outpatient Follow-up:  1. Follow up with Garnet Koyanagi, DO in 1 week. On follow-up patient in need a basic metabolic profile to follow-up on electrolytes and renal function as well as a CBC to follow-up on H&H. Patient's dizziness also need to be followed up upon. Patient was placed on meclizine as needed. Patient may discuss with her gastroenterologist/oncologist as to whether this may be a side effect of Gleevec. 2. 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 She then developed diarrhea for past 2 days prior to admission. Patient just returned from a cruise ship. She stated she recently has been sick with URI sxs. She denied any other sick contacts, fever, chills, chest pain, sob, hematemesis, or melana. In ED, CT abd with no evidence of acute illness/or of residual or recurrent cancer.   Hospital Course:  Gastroenteritis due to Noravirus -Presented with n/v/ diarrhea, pt recently  just returned from a cruise.CT abd/pelvis was done which was without evidence of acute illness. She remained a febrile and no leukocytosis during hospitalization.  Patient was initially placed on a clear liquid diet and diet advanced which she tolerated. GI pathogen panel was also obtained which came up positive for noravirus . Patient was treated with supportive care. Patient improved clinically and be discharged in stable and improved condition with close outpatient follow-up.  Hypotension -Blood pressure was borderline during admission. Patient was hydrated with IV fluids was euvolemic by day of discharge. Outpatient follow-up.   dizziness -BP remained borderline during hospitalization and patient had associated dizziness. Likely secondary to vertigo. Given  1L NS bolus and maintained on IV fluids. Orthostatic vitals were negative. MRI of the brain was negative for acute abnormalities. Patient was seen by physical therapy for vestibular evaluation which was negative. Patient stated symptoms had been ongoing for the past 4 months. Patient be discharged home on meclizine 3 times daily as needed. Patient is to follow-up with PCP as outpatient. Patient may also discuss with her oncologist as to whether is an associated side effect of her Gleevec.  Hx of Gastrointestinal stromal tumor -s/p partial gasterectomy 10/2013 on Gleevec since 12/2013.   -Held Gleevec during the hospitalization and patient to restart Gleevec on Sunday 09/22/14  Macrocytic anemia -Patient noted to have a microcytic anemia during the hospitalization which was felt to be dilutional in nature. Patient had no overt bleeding. Patient's hemoglobin remained stable at 10.5. Outpatient follow-up.   Hypokalemia -Patient was noted to be hypokalemic during the hospitalization. Patient's potassium was repleted and hypokalemia had resolved by day of  discharge.  Depression -stable no SI or hallucinations -Continued on home regimen of  Prozac 40mg  daily  GERD -Continued on Protonix 40mg  daily  Morbid Obesity -BMI 67.8  Procedures: Ct ABD/PELVIS 09/15/14 MRI head 09/18/14  Consultations:  none  Discharge Exam: Filed Vitals:   09/18/14 1508  BP: 113/44  Pulse: 77  Temp: 98.4 F (36.9 C)  Resp: 18     Exam General: Alert obese AA female in NAD  Eyes: Anicteric account. Right eye pupil dilated Cardiovascular: Regular rate and rhythm.  No murmurs, rubs, or gallops. Respiratory: Clear to auscultate bilaterally.  No rhonchi or crepitations. Abdomen: Soft nontender bowel sounds present. No guarding or rigidity.  Musculoskeletal: No edema.  Psychiatric: Appears normal.  Neurologic: Alert awake oriented to time place and person.    Discharge Instructions      Discharge Instructions    Call MD for:  persistant dizziness or light-headedness    Complete by:  As directed      Diet - low sodium heart healthy    Complete by:  As directed      Diet general    Complete by:  As directed      Discharge instructions    Complete by:  As directed   Follow up with Garnet Koyanagi, DO in 1 week.     Increase activity slowly    Complete by:  As directed             Medication List    TAKE these medications        cetirizine 10 MG tablet  Commonly known as:  ZYRTEC  Take 20 mg by mouth daily as needed (for itching).     dextromethorphan-guaiFENesin 30-600 MG per 12 hr tablet  Commonly known as:  MUCINEX DM  Take 1 tablet by mouth 2 (two) times daily.     diphenhydrAMINE 25 MG tablet  Commonly known as:  BENADRYL  Take 100 mg by mouth at bedtime as needed for itching.     FLUoxetine 40 MG capsule  Commonly known as:  PROZAC  Take 1 capsule (40 mg total) by mouth daily.     guaiFENesin 600 MG 12 hr tablet  Commonly known as:  MUCINEX  Take 1 tablet (600 mg total) by mouth 2 (two) times daily as needed for cough or to loosen phlegm.     HYDROcodone-acetaminophen 5-325 MG per tablet  Commonly known  as:  NORCO  Take 1 tablet by mouth every 6 (six) hours as needed for severe pain.     ibuprofen 200 MG tablet  Commonly known as:  ADVIL,MOTRIN  Take 800 mg by mouth every 6 (six) hours as needed for moderate pain.     imatinib 400 MG tablet  Commonly known as:  GLEEVEC  Take 1 tablet (400 mg total) by mouth daily. Take with meals and large glass of water.Caution:Chemotherapy.  Start taking on:  09/21/2014     meclizine 25 MG tablet  Commonly known as:  ANTIVERT  Take 1 tablet (25 mg total) by mouth 3 (three) times daily as needed for dizziness.     omeprazole 20 MG capsule  Commonly known as:  PRILOSEC  Take 20 mg by mouth daily.     oxyCODONE 5 MG immediate release tablet  Commonly known as:  ROXICODONE  Take 1 tablet (5 mg total) by mouth every 6 (six) hours as needed for severe pain.     PROAIR HFA 108 (90 BASE) MCG/ACT inhaler  Generic  drug:  albuterol  Inhale 1 puff into the lungs every 6 (six) hours as needed for wheezing or shortness of breath.     prochlorperazine 10 MG tablet  Commonly known as:  COMPAZINE  Take 1 tablet by mouth every 6 (six) hours as needed for nausea or vomiting.     promethazine 25 MG tablet  Commonly known as:  PHENERGAN  Take 1 tablet (25 mg total) by mouth every 6 (six) hours as needed for nausea or vomiting.       Allergies  Allergen Reactions  . Chocolate Flavor Hives  . Ivp Dye [Iodinated Diagnostic Agents] Hives  . Shellfish Allergy Hives  . Strawberry Hives   Follow-up Information    Follow up with Garnet Koyanagi, DO. Schedule an appointment as soon as possible for a visit in 1 week.   Specialty:  Family Medicine   Contact information:   [place] [address] [phone]        The results of significant diagnostics from this hospitalization (including imaging, microbiology, ancillary and laboratory) are listed below for reference.    Significant Diagnostic Studies: Ct Abdomen Pelvis Wo  Contrast  09/15/2014   CLINICAL DATA:  Abdominal pain above the umbilical area. Diarrhea and vomiting. History of partial gastrectomy for cancerous tumor (10/2013).  EXAM: CT ABDOMEN AND PELVIS WITHOUT CONTRAST  TECHNIQUE: Multidetector CT imaging of the abdomen and pelvis was performed following the standard protocol without IV contrast.  COMPARISON:  CT abdomen pelvis -07/19/2013; 03/16/2013; CT guided subcutaneous tissue aspiration -03/19/2013.  FINDINGS: The lack of intravenous contrast limits the ability to evaluate solid abdominal organs. Examination is further degraded due to patient body habitus.  Normal hepatic contour.  Post cholecystectomy.  No ascites.  Normal noncontrast appearance of the bilateral kidneys. No renal stones. No urinary obstruction or perinephric stranding. Normal noncontrast appearance of the bilateral adrenal glands, pancreas and spleen.  The patient is undergone resection of previously noted exophytic mass arising from the gastric fundus. No evidence of residual or recurrent disease on this noncontrast examination. The bowel is otherwise normal in course and caliber without wall thickening or evidence of obstruction. The appendix is not visualized however there are no definitive inflammatory change within the right lower abdominal quadrant on this noncontrast examination. No pneumoperitoneum, pneumatosis or portal venous gas.  Normal caliber the abdominal aorta. Shotty retroperitoneal lymph nodes appear unchanged an well numerous are individually not enlarged by size criteria with index left-sided periaortic lymph node measuring 0.6 cm in greatest short axis diameter (image 44, series 21) and presumably due to patient body habitus. No definitive worsening bulky retroperitoneal, mesenteric, pelvic or inguinal lymphadenopathy on this noncontrast examination.  Post hysterectomy. No definitive adnexal lesions on this noncontrast examination. No free fluid in the pelvic cul-de-sac. Normal  appearance of the urinary bladder given degree distention.  Limited visualization of lower thorax is negative for focal airspace opacity or pleural effusion. There is minimal subsegmental atelectasis within the imaged caudal aspect of the right middle lobe. No pleural effusion.  Normal heart size.  No pericardial effusion.  Mild to moderate multilevel lumbar spine DDD, worse at L4-L5 with disc space height loss, endplate irregularity and sclerosis.  There is a minimal amount of thickening within the subcutaneous tissues about the inferior aspect of the lower ventral abdominal wall/pannus (image 66, series 21) without development of a recurrent seroma. Regional soft tissues appear otherwise normal. Specifically, no evidence of a periumbilical hernia.  IMPRESSION:  1. No explanation for patient's periumbilical abdominal pain. 2. Interval resection of previously noted exophytic mass arising from the gastric fundus. No definitive evidence of residual or recurrent disease on this noncontrast examination. 3. Persistent minimal subcutaneous thickening about the inferior aspect of the ventral abdominal wall/pannus without evidence of a recurrent abdominal wall seroma / abscess.   Electronically Signed   By: Sandi Mariscal M.D.   On: 09/15/2014 14:20   Dg Chest 2 View  08/29/2014   CLINICAL DATA:  Sore throat and headache since August 26, 2014.  EXAM: CHEST  2 VIEW  COMPARISON:  Chest radiograph August 27, 2013  FINDINGS: Mild prominence of central bronchovascular markings, similar. No pleural effusions or focal consolidations. No pneumothorax. Soft tissue planes and included osseous structures are nonsuspicious. Large body habitus. Approximately 1 cm radiopaque foreign body projects over the mid mediastinum only on the lateral radiograph and is likely external to the patient.  IMPRESSION: Mild prominence of central bronchovascular markings can be seen with bronchitis or reactive airway disease.   Electronically Signed    By: Elon Alas   On: 08/29/2014 02:50   Mr Brain Wo Contrast  09/18/2014   CLINICAL DATA:  43 year old female with dizziness. Initial encounter. Recent nausea vomiting diarrhea. Current history of GIST.  EXAM: MRI HEAD WITHOUT CONTRAST  TECHNIQUE: Multiplanar, multiecho pulse sequences of the brain and surrounding structures were obtained without intravenous contrast.  COMPARISON:  Head CT without contrast 07/14/2014. Brain MRI 07/31/2013.  FINDINGS: Stable and normal cerebral volume. Major intracranial vascular flow voids are stable. No restricted diffusion to suggest acute infarction. No midline shift, mass effect, evidence of mass lesion, ventriculomegaly, extra-axial collection or acute intracranial hemorrhage. Cervicomedullary junction and pituitary are within normal limits. Negative visualized cervical spine. Stable and normal for age gray and white matter signal. Visible internal auditory structures appear normal.  Moderate paranasal sinus inflammation has not significantly changed. Visualized orbit soft tissues are within normal limits. Visualized scalp soft tissues are within normal limits. Visualized bone marrow signal is within normal limits.  IMPRESSION: 1. No acute intracranial abnormality. Stable and normal for age non contrast MRI appearance of the brain. 2. Chronic paranasal sinus inflammation.   Electronically Signed   By: Lars Pinks M.D.   On: 09/18/2014 15:51    Microbiology: No results found for this or any previous visit (from the past 240 hour(s)).   Labs: Basic Metabolic Panel:  Recent Labs Lab 09/15/14 1109 09/16/14 0400 09/17/14 0614 09/18/14 1002  NA 140 138 141 140  K 4.1 3.5* 4.0 3.7  CL 102 105 110 107  CO2 23 20 18* 23  GLUCOSE 89 86 74 70  BUN 10 10 4* 4*  CREATININE 0.61 0.55 0.48* 0.51  CALCIUM 9.1 7.7* 7.8* 8.0*   Liver Function Tests:  Recent Labs Lab 09/15/14 1109 09/16/14 0400  AST 27 18  ALT 34 21  ALKPHOS 120* 83  BILITOT 0.4 0.4  PROT  8.2 6.2  ALBUMIN 3.8 2.9*    Recent Labs Lab 09/15/14 1109  LIPASE 17   No results for input(s): AMMONIA in the last 168 hours. CBC:  Recent Labs Lab 09/15/14 1109 09/16/14 0400 09/17/14 0614 09/18/14 1002  WBC 7.1 4.8 5.0 5.6  NEUTROABS 5.6  --   --   --   HGB 13.0 10.7* 10.6* 10.5*  HCT 39.3 32.2* 32.7* 31.4*  MCV 90.3 91.0 90.6 92.6  PLT 264 219 213 211   Cardiac Enzymes: No results for input(s):  CKTOTAL, CKMB, CKMBINDEX, TROPONINI in the last 168 hours. BNP: BNP (last 3 results) No results for input(s): PROBNP in the last 8760 hours. CBG: No results for input(s): GLUCAP in the last 168 hours.     SignedIrine Seal MD [phone] [facility] 09/18/2014, 5:50 PM     I have seen and assessed patient and agree with Len Childs, PA discharge summary. Discharge summary has been reviewed personally by me and edited and changes made.

## 2014-09-18 NOTE — Progress Notes (Signed)
OT Cancellation Note  Patient Details Name: Stacy Hill MRN: [id number] DOB: [birth year]   Cancelled Treatment:    Reason Eval/Treat Not Completed: OT screened, no needs identified, will sign off. Pt report that she is now able to perform her BADLs including getting up to the bathroom without issues/independently, no OT needs identified.  Almon Register [phone] 09/18/2014, 3:26 PM

## 2014-09-22 ENCOUNTER — Telehealth: Payer: Self-pay

## 2014-09-22 NOTE — Telephone Encounter (Signed)
Left a message for call back.  

## 2014-10-07 NOTE — Telephone Encounter (Signed)
Left a message for call back.  

## 2015-02-22 ENCOUNTER — Emergency Department (HOSPITAL_BASED_OUTPATIENT_CLINIC_OR_DEPARTMENT_OTHER)
Admission: EM | Admit: 2015-02-22 | Discharge: 2015-02-22 | Disposition: A | Discharge status: Home / Self Care | Payer: BLUE CROSS/BLUE SHIELD | Attending: Emergency Medicine | Admitting: Emergency Medicine

## 2015-02-22 ENCOUNTER — Emergency Department (HOSPITAL_BASED_OUTPATIENT_CLINIC_OR_DEPARTMENT_OTHER): Payer: BLUE CROSS/BLUE SHIELD

## 2015-02-22 ENCOUNTER — Encounter (HOSPITAL_BASED_OUTPATIENT_CLINIC_OR_DEPARTMENT_OTHER): Payer: Self-pay | Admitting: *Deleted

## 2015-02-22 DIAGNOSIS — Z7952 Long term (current) use of systemic steroids: Secondary | ICD-10-CM | POA: Insufficient documentation

## 2015-02-22 DIAGNOSIS — E669 Obesity, unspecified: Secondary | ICD-10-CM | POA: Diagnosis not present

## 2015-02-22 DIAGNOSIS — Z79899 Other long term (current) drug therapy: Secondary | ICD-10-CM | POA: Diagnosis not present

## 2015-02-22 DIAGNOSIS — Z8709 Personal history of other diseases of the respiratory system: Secondary | ICD-10-CM | POA: Diagnosis not present

## 2015-02-22 DIAGNOSIS — K219 Gastro-esophageal reflux disease without esophagitis: Secondary | ICD-10-CM | POA: Diagnosis not present

## 2015-02-22 DIAGNOSIS — F329 Major depressive disorder, single episode, unspecified: Secondary | ICD-10-CM | POA: Insufficient documentation

## 2015-02-22 DIAGNOSIS — Z8742 Personal history of other diseases of the female genital tract: Secondary | ICD-10-CM | POA: Insufficient documentation

## 2015-02-22 DIAGNOSIS — Z862 Personal history of diseases of the blood and blood-forming organs and certain disorders involving the immune mechanism: Secondary | ICD-10-CM | POA: Insufficient documentation

## 2015-02-22 DIAGNOSIS — Z859 Personal history of malignant neoplasm, unspecified: Secondary | ICD-10-CM | POA: Diagnosis not present

## 2015-02-22 DIAGNOSIS — M25561 Pain in right knee: Secondary | ICD-10-CM | POA: Diagnosis present

## 2015-02-22 DIAGNOSIS — Z8603 Personal history of neoplasm of uncertain behavior: Secondary | ICD-10-CM | POA: Insufficient documentation

## 2015-02-22 DIAGNOSIS — F419 Anxiety disorder, unspecified: Secondary | ICD-10-CM | POA: Diagnosis not present

## 2015-02-22 DIAGNOSIS — Z8669 Personal history of other diseases of the nervous system and sense organs: Secondary | ICD-10-CM | POA: Insufficient documentation

## 2015-02-22 MED ORDER — OXYCODONE-ACETAMINOPHEN 5-325 MG PO TABS: 1.0000 | ORAL_TABLET | Freq: Four times a day (QID) | ORAL | Status: DC | PRN

## 2015-02-22 MED ORDER — OXYCODONE-ACETAMINOPHEN 5-325 MG PO TABS
1.0000 | ORAL_TABLET | Freq: Once | ORAL | Status: AC
  Administered 2015-02-22: 1 via ORAL
  Filled 2015-02-22: qty 1

## 2015-02-22 MED ORDER — PREDNISONE 20 MG PO TABS: 40.0000 mg | ORAL_TABLET | Freq: Every day | ORAL | Status: DC

## 2015-02-22 NOTE — ED Notes (Signed)
Returned from xray

## 2015-02-22 NOTE — ED Provider Notes (Signed)
CSN: [id number]     Arrival date & time 02/22/15  0802 History   First MD Initiated Contact with Patient 02/22/15 0809     Chief Complaint  Patient presents with  . Knee Pain     (Consider location/radiation/quality/duration/timing/severity/associated sxs/prior Treatment) Patient is a 44 y.o. female presenting with knee pain. The history is provided by the patient.  Knee Pain Location:  Knee  patient's had right knee pain over the last few weeks. No specific trauma. His been getting worse. No relief with ibuprofen. No numbness weakness. She now has to use canes. No previous history of severe knee problems. She states the pain goes from her knee all the way up into her rear end and down the back of her leg. She's been having difficulty walking due to it.  Past Medical History  Diagnosis Date  . Fibroid   . Depression   . Seasonal allergies   . Shortness of breath     On exhertion,recently,with anemia per pt.  . Anemia   . Headache(784.0)   . GERD (gastroesophageal reflux disease)     Takes Tums prn.  . Restless leg syndrome   . Anxiety     Last visit to [facility] 2008 gave birth to stillborn, pt teary today discussing  . Stromal tumor of digestive system   . Cancer   . Gastrointestinal stromal tumor (GIST)   . Nausea and vomiting 09/15/2014   Past Surgical History  Procedure Laterality Date  . Cholecystectomy    . Cesarean section    . Abdominal hysterectomy N/A 02/13/2013    Procedure: HYSTERECTOMY ABDOMINAL WITH BILATERAL SALPINGECTOMY WITH EXTENSIVE LYSIS OF ADHESIONS;  Surgeon: Princess Bruins, MD;  Location: [facility];  Service: Gynecology;  Laterality: N/A;  . Gastrectomy      partial   Family History  Problem Relation Age of Onset  . Cancer Mother     Bladder cancer  . Hypertension Mother     Living, 3  . COPD Mother   . Hypertension Sister   . Heart attack Maternal Grandfather   . Other Father     Died, 80s   History  Substance Use Topics  . Smoking status:  Never Smoker   . Smokeless tobacco: Never Used  . Alcohol Use: Yes     Comment: rare   OB History    Gravida Para Term Preterm AB TAB SAB Ectopic Multiple Living   2 2 1 1      1      Review of Systems  Cardiovascular: Negative for leg swelling.  Musculoskeletal:       Right knee pain.  Skin: Negative for wound.      Allergies  Chocolate flavor; Ivp dye; Shellfish allergy; and Strawberry  Home Medications   Prior to Admission medications   Medication Sig Start Date End Date Taking? Authorizing Provider  albuterol (PROAIR HFA) 108 (90 BASE) MCG/ACT inhaler Inhale 1 puff into the lungs every 6 (six) hours as needed for wheezing or shortness of breath.  10/30/13   Historical Provider, MD  cetirizine (ZYRTEC) 10 MG tablet Take 20 mg by mouth daily as needed (for itching).     Historical Provider, MD  dextromethorphan-guaiFENesin (MUCINEX DM) 30-600 MG per 12 hr tablet Take 1 tablet by mouth 2 (two) times daily. 09/03/14   Harvie Heck, PA-C  diphenhydrAMINE (BENADRYL) 25 MG tablet Take 100 mg by mouth at bedtime as needed for itching.     Historical Provider, MD  FLUoxetine (PROZAC) 40  MG capsule Take 1 capsule (40 mg total) by mouth daily. 05/13/14   Rosalita Chessman, DO  guaiFENesin (MUCINEX) 600 MG 12 hr tablet Take 1 tablet (600 mg total) by mouth 2 (two) times daily as needed for cough or to loosen phlegm. Patient not taking: Reported on 09/04/2014 08/29/14   Baron Sane, PA-C  HYDROcodone-acetaminophen (NORCO) 5-325 MG per tablet Take 1 tablet by mouth every 6 (six) hours as needed for severe pain. 09/15/14   Mercedes Camprubi-Soms, PA-C  ibuprofen (ADVIL,MOTRIN) 200 MG tablet Take 800 mg by mouth every 6 (six) hours as needed for moderate pain.    Historical Provider, MD  imatinib (GLEEVEC) 400 MG tablet Take 1 tablet (400 mg total) by mouth daily. Take with meals and large glass of water.Caution:Chemotherapy. 09/21/14   Eugenie Filler, MD  meclizine (ANTIVERT) 25 MG  tablet Take 1 tablet (25 mg total) by mouth 3 (three) times daily as needed for dizziness. 09/18/14   Eugenie Filler, MD  omeprazole (PRILOSEC) 20 MG capsule Take 20 mg by mouth daily.  03/04/14   Historical Provider, MD  oxyCODONE (ROXICODONE) 5 MG immediate release tablet Take 1 tablet (5 mg total) by mouth every 6 (six) hours as needed for severe pain. 09/15/14   Mercedes Camprubi-Soms, PA-C  oxyCODONE-acetaminophen (PERCOCET/ROXICET) 5-325 MG per tablet Take 1-2 tablets by mouth every 6 (six) hours as needed for severe pain. 02/22/15   Davonna Belling, MD  predniSONE (DELTASONE) 20 MG tablet Take 2 tablets (40 mg total) by mouth daily. 02/22/15   Davonna Belling, MD  prochlorperazine (COMPAZINE) 10 MG tablet Take 1 tablet by mouth every 6 (six) hours as needed for nausea or vomiting.  12/26/13   Historical Provider, MD  promethazine (PHENERGAN) 25 MG tablet Take 1 tablet (25 mg total) by mouth every 6 (six) hours as needed for nausea or vomiting. 09/15/14   Mercedes Camprubi-Soms, PA-C   BP 131/71 mmHg  Pulse 88  Temp(Src) 97.9 F (36.6 C) (Oral)  Resp 20  SpO2 98%  LMP 01/10/2013 Physical Exam  Constitutional: She appears well-developed.  Patient is obese  Cardiovascular: Normal rate.   Pulmonary/Chest: Effort normal.  Musculoskeletal: She exhibits tenderness.  Tenderness to lateral aspect of right knee. Somewhat difficult examination due to patient's body habitus. Pain with flexion extension. Pain with valgus flexion the pain appears to be more severe laterally on the knee. No distal edema.  Skin: Skin is warm.    ED Course  Procedures (including critical care time) Labs Review Labs Reviewed - No data to display  Imaging Review Dg Knee Complete 4 Views Right  02/22/2015   CLINICAL DATA:  Right knee pain for the past month.  EXAM: RIGHT KNEE - COMPLETE 4+ VIEW  COMPARISON:  None.  FINDINGS: There is fragmentation or an accessory ossification center adjacent to the medial tibial  spine. Otherwise, normal appearing bones and soft tissues. No effusion.  IMPRESSION: Fragmentation or accessory ossification center adjacent to the medial tibial spine. Otherwise, normal examination.   Electronically Signed   By: Claudie Revering M.D.   On: 02/22/2015 08:54     EKG Interpretation None      MDM   Final diagnoses:  Right knee pain    Patient with right knee pain. Reassuring x-ray. Patient is too large for a knee immobilizer. We'll give small dose of steroids and pain medicines and have follow-up with sports medicine. I doubt infection or DVT.    Davonna Belling, MD 02/22/15 [phone]

## 2015-02-22 NOTE — ED Notes (Signed)
Patient transported to X-ray 

## 2015-02-22 NOTE — ED Notes (Addendum)
Patient c/o R knee pain that has grown worse since yesterday. Taking ibuprofen but no relief

## 2015-02-22 NOTE — Discharge Instructions (Signed)

## 2015-02-23 ENCOUNTER — Encounter: Payer: Self-pay | Admitting: Family Medicine

## 2015-02-23 ENCOUNTER — Ambulatory Visit (INDEPENDENT_AMBULATORY_CARE_PROVIDER_SITE_OTHER): Payer: BLUE CROSS/BLUE SHIELD | Admitting: Family Medicine

## 2015-02-23 VITALS — BP 146/90 | HR 108 | Ht 63.0 in | Wt 374.0 lb

## 2015-02-23 DIAGNOSIS — M79604 Pain in right leg: Secondary | ICD-10-CM

## 2015-02-23 DIAGNOSIS — M25561 Pain in right knee: Secondary | ICD-10-CM | POA: Diagnosis not present

## 2015-02-23 NOTE — Patient Instructions (Signed)
Your pain is consistent with proximal calf and distal hamstring tendinopathy, less likely a degenerative meniscus tear. Physical therapy and home exercises are the most important parts of treatment. Do home exercises every day you don't go to therapy. Icing or heat 15 minutes at a time as needed. Braces usually won't help with this. Can take ibuprofen, aleve, or tylenol if needed for pain. Elevate as needed for swelling. Follow up with me 4-6 weeks after you start therapy. If not improving would consider an MRI.

## 2015-02-25 DIAGNOSIS — C49A0 Gastrointestinal stromal tumor, unspecified site: Secondary | ICD-10-CM | POA: Insufficient documentation

## 2015-02-25 DIAGNOSIS — F329 Major depressive disorder, single episode, unspecified: Secondary | ICD-10-CM | POA: Insufficient documentation

## 2015-02-25 DIAGNOSIS — M25561 Pain in right knee: Secondary | ICD-10-CM | POA: Insufficient documentation

## 2015-02-25 DIAGNOSIS — F32A Depression, unspecified: Secondary | ICD-10-CM | POA: Insufficient documentation

## 2015-02-25 NOTE — Progress Notes (Signed)
PCP: Garnet Koyanagi, DO  Subjective:   HPI: Patient is a 44 y.o. female here for right knee pain.  Patient reports she has had right knee pain for about 1 month. Most pain is posterolateral. No known injury. Worse when walking and at end of day. Tried ibuprofen. Using a cane. Gets popping, gives out as well. No catching, locking. Pain 4/10 currently, up to 8/10 with walking. X-rays normal except for accessory ossification center next to medial tibial spine.  Past Medical History  Diagnosis Date  . Fibroid   . Depression   . Seasonal allergies   . Shortness of breath     On exhertion,recently,with anemia per pt.  . Anemia   . Headache(784.0)   . GERD (gastroesophageal reflux disease)     Takes Tums prn.  . Restless leg syndrome   . Anxiety     Last visit to [facility] 2008 gave birth to stillborn, pt teary today discussing  . Stromal tumor of digestive system   . Cancer   . Gastrointestinal stromal tumor (GIST)   . Nausea and vomiting 09/15/2014    Current Outpatient Prescriptions on File Prior to Visit  Medication Sig Dispense Refill  . cetirizine (ZYRTEC) 10 MG tablet Take 20 mg by mouth daily as needed (for itching).     Marland Kitchen FLUoxetine (PROZAC) 40 MG capsule Take 1 capsule (40 mg total) by mouth daily. 90 capsule 3  . imatinib (GLEEVEC) 400 MG tablet Take 1 tablet (400 mg total) by mouth daily. Take with meals and large glass of water.Caution:Chemotherapy.    Marland Kitchen omeprazole (PRILOSEC) 20 MG capsule Take 20 mg by mouth daily.     Marland Kitchen oxyCODONE-acetaminophen (PERCOCET/ROXICET) 5-325 MG per tablet Take 1-2 tablets by mouth every 6 (six) hours as needed for severe pain. 10 tablet 0  . predniSONE (DELTASONE) 20 MG tablet Take 2 tablets (40 mg total) by mouth daily. 6 tablet 0  . prochlorperazine (COMPAZINE) 10 MG tablet Take 1 tablet by mouth every 6 (six) hours as needed for nausea or vomiting.     . promethazine (PHENERGAN) 25 MG tablet Take 1 tablet (25 mg total) by mouth every 6 (six)  hours as needed for nausea or vomiting. 10 tablet 0   No current facility-administered medications on file prior to visit.    Past Surgical History  Procedure Laterality Date  . Cholecystectomy    . Cesarean section    . Abdominal hysterectomy N/A 02/13/2013    Procedure: HYSTERECTOMY ABDOMINAL WITH BILATERAL SALPINGECTOMY WITH EXTENSIVE LYSIS OF ADHESIONS;  Surgeon: Princess Bruins, MD;  Location: [facility];  Service: Gynecology;  Laterality: N/A;  . Gastrectomy      partial    Allergies  Allergen Reactions  . Chocolate Flavor Hives  . Ivp Dye [Iodinated Diagnostic Agents] Hives  . Shellfish Allergy Hives  . Strawberry Hives    History   Social History  . Marital Status: Single    Spouse Name: N/A  . Number of Children: N/A  . Years of Education: N/A   Occupational History  . Not on file.   Social History Main Topics  . Smoking status: Never Smoker   . Smokeless tobacco: Never Used  . Alcohol Use: 0.0 oz/week    0 Standard drinks or equivalent per week     Comment: rare  . Drug Use: No  . Sexual Activity: No   Other Topics Concern  . Not on file   Social History Narrative   She has been on  disability for stromal tumor since April 2014, previously worked at [employer].   Lives with 18 year old son.    Family History  Problem Relation Age of Onset  . Cancer Mother     Bladder cancer  . Hypertension Mother     Living, 15  . COPD Mother   . Hypertension Sister   . Heart attack Maternal Grandfather   . Other Father     Died, 75s    BP 146/90 mmHg  Pulse 108  Ht 5\' 3"  (1.6 m)  Wt 374 lb (169.645 kg)  BMI 66.27 kg/m2  LMP 01/10/2013  Review of Systems: See HPI above.    Objective:  Physical Exam:  Gen: NAD  Right knee: No gross deformity, ecchymoses, swelling. TTP lateral hamstring distally, proximal calf.  No joint line, other tenderness of knee. FROM with pain on calf raise and resisted knee flexion. Negative ant/post drawers.  Negative valgus/varus testing. Negative lachmanns. Negative mcmurrays, apleys, patellar apprehension. NV intact distally.    Assessment & Plan:  1. Right knee pain - consistent with proximal calf, distal hamstring tendinopathy.  Start physical therapy and home exercise program.  Ice or heat as needed.  NSAIDs or tylenol as needed for pain.  F/u in 4-6 weeks.  Consider MRI to assess for meniscus tear if not improving.

## 2015-02-25 NOTE — Assessment & Plan Note (Signed)
consistent with proximal calf, distal hamstring tendinopathy.  Start physical therapy and home exercise program.  Ice or heat as needed.  NSAIDs or tylenol as needed for pain.  F/u in 4-6 weeks.  Consider MRI to assess for meniscus tear if not improving.

## 2015-02-26 ENCOUNTER — Ambulatory Visit: Payer: BLUE CROSS/BLUE SHIELD

## 2015-05-05 ENCOUNTER — Encounter (HOSPITAL_BASED_OUTPATIENT_CLINIC_OR_DEPARTMENT_OTHER): Payer: Self-pay | Admitting: Emergency Medicine

## 2015-05-05 ENCOUNTER — Emergency Department (HOSPITAL_BASED_OUTPATIENT_CLINIC_OR_DEPARTMENT_OTHER)
Admission: EM | Admit: 2015-05-05 | Discharge: 2015-05-05 | Disposition: A | Discharge status: Home / Self Care | Payer: BLUE CROSS/BLUE SHIELD | Attending: Emergency Medicine | Admitting: Emergency Medicine

## 2015-05-05 ENCOUNTER — Emergency Department (HOSPITAL_BASED_OUTPATIENT_CLINIC_OR_DEPARTMENT_OTHER): Payer: BLUE CROSS/BLUE SHIELD

## 2015-05-05 DIAGNOSIS — Z8669 Personal history of other diseases of the nervous system and sense organs: Secondary | ICD-10-CM | POA: Insufficient documentation

## 2015-05-05 DIAGNOSIS — F419 Anxiety disorder, unspecified: Secondary | ICD-10-CM | POA: Insufficient documentation

## 2015-05-05 DIAGNOSIS — R1013 Epigastric pain: Secondary | ICD-10-CM | POA: Diagnosis not present

## 2015-05-05 DIAGNOSIS — Z7952 Long term (current) use of systemic steroids: Secondary | ICD-10-CM | POA: Diagnosis not present

## 2015-05-05 DIAGNOSIS — K219 Gastro-esophageal reflux disease without esophagitis: Secondary | ICD-10-CM | POA: Insufficient documentation

## 2015-05-05 DIAGNOSIS — R1012 Left upper quadrant pain: Secondary | ICD-10-CM | POA: Insufficient documentation

## 2015-05-05 DIAGNOSIS — F329 Major depressive disorder, single episode, unspecified: Secondary | ICD-10-CM | POA: Diagnosis not present

## 2015-05-05 DIAGNOSIS — Z79899 Other long term (current) drug therapy: Secondary | ICD-10-CM | POA: Diagnosis not present

## 2015-05-05 DIAGNOSIS — Z859 Personal history of malignant neoplasm, unspecified: Secondary | ICD-10-CM | POA: Insufficient documentation

## 2015-05-05 DIAGNOSIS — Z862 Personal history of diseases of the blood and blood-forming organs and certain disorders involving the immune mechanism: Secondary | ICD-10-CM | POA: Diagnosis not present

## 2015-05-05 DIAGNOSIS — Z86018 Personal history of other benign neoplasm: Secondary | ICD-10-CM | POA: Diagnosis not present

## 2015-05-05 LAB — COMPREHENSIVE METABOLIC PANEL
ALK PHOS: 90 U/L (ref 38–126)
ALT: 20 U/L (ref 14–54)
ANION GAP: 8 (ref 5–15)
AST: 21 U/L (ref 15–41)
Albumin: 3.5 g/dL (ref 3.5–5.0)
BUN: 10 mg/dL (ref 6–20)
CALCIUM: 8.5 mg/dL — AB (ref 8.9–10.3)
CHLORIDE: 102 mmol/L (ref 101–111)
CO2: 26 mmol/L (ref 22–32)
Creatinine, Ser: 0.56 mg/dL (ref 0.44–1.00)
GFR calc non Af Amer: >60 mL/min (ref >60)
Glucose, Bld: 97 mg/dL (ref 65–99)
Potassium: 3.9 mmol/L (ref 3.5–5.1)
Sodium: 136 mmol/L (ref 135–145)
Total Bilirubin: 0.3 mg/dL (ref 0.3–1.2)
Total Protein: 7.3 g/dL (ref 6.5–8.1)

## 2015-05-05 LAB — LIPASE, BLOOD: LIPASE: 15 U/L — AB (ref 22–51)

## 2015-05-05 LAB — CBC WITH DIFFERENTIAL/PLATELET
BASOS PCT: 0 % (ref 0–1)
Basophils Absolute: 0 10*3/uL (ref 0.0–0.1)
EOS PCT: 11 % — AB (ref 0–5)
Eosinophils Absolute: 0.8 10*3/uL — ABNORMAL HIGH (ref 0.0–0.7)
HEMATOCRIT: 35.7 % — AB (ref 36.0–46.0)
Hemoglobin: 11.8 g/dL — ABNORMAL LOW (ref 12.0–15.0)
Lymphocytes Relative: 36 % (ref 12–46)
Lymphs Abs: 2.7 10*3/uL (ref 0.7–4.0)
MCH: 29.4 pg (ref 26.0–34.0)
MCHC: 33.1 g/dL (ref 30.0–36.0)
MCV: 88.8 fL (ref 78.0–100.0)
Monocytes Absolute: 0.4 10*3/uL (ref 0.1–1.0)
Monocytes Relative: 5 % (ref 3–12)
NEUTROS ABS: 3.7 10*3/uL (ref 1.7–7.7)
Neutrophils Relative %: 48 % (ref 43–77)
Platelets: UNDETERMINED 10*3/uL (ref 150–400)
RBC: 4.02 MIL/uL (ref 3.87–5.11)
RDW: 14.5 % (ref 11.5–15.5)
WBC: 7.7 10*3/uL (ref 4.0–10.5)

## 2015-05-05 MED ORDER — GI COCKTAIL ~~LOC~~
30.0000 mL | Freq: Once | ORAL | Status: AC
  Administered 2015-05-05: 30 mL via ORAL
  Filled 2015-05-05: qty 30

## 2015-05-05 MED ORDER — SUCRALFATE 1 G PO TABS: 1.0000 g | ORAL_TABLET | Freq: Three times a day (TID) | ORAL | Status: DC

## 2015-05-05 MED ORDER — BARIUM SULFATE 2 % PO SUSP: 450.0000 mL | Freq: Once | ORAL | Status: DC

## 2015-05-05 MED ORDER — SUCRALFATE 1 G PO TABS
1.0000 g | ORAL_TABLET | Freq: Once | ORAL | Status: AC
  Administered 2015-05-05: 1 g via ORAL
  Filled 2015-05-05: qty 1

## 2015-05-05 NOTE — ED Provider Notes (Signed)
CSN: [id number]     Arrival date & time 05/05/15  0006 History   First MD Initiated Contact with Patient 05/05/15 0016     Chief Complaint  Patient presents with  . Abdominal Pain     (Consider location/radiation/quality/duration/timing/severity/associated sxs/prior Treatment) HPI Stacy Hill is a 44 y.o. 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 Patient states she had gradual onset of epigastric abdominal pain that started approximately 10:00 this morning, has been constant throughout the day with no aggravating or relieving factors. She reports eating but that does not change her discomfort. She rates her discomfort as a 6/10 and characterizes it as a stabbing pain. She denies fevers, chills, overt nausea or vomiting, diarrhea, constipation. She has tried taking 3 tramadol, but that did not relieve her symptoms. No other aggravating or modifying factors.  Past Medical History  Diagnosis Date  . Fibroid   . Depression   . Seasonal allergies   . Shortness of breath     On exhertion,recently,with anemia per pt.  . Anemia   . Headache(784.0)   . GERD (gastroesophageal reflux disease)     Takes Tums prn.  . Restless leg syndrome   . Anxiety     Last visit to [facility] 2008 gave birth to stillborn, pt teary today discussing  . Stromal tumor of digestive system   . Cancer   . Gastrointestinal stromal tumor (GIST)   . Nausea and vomiting 09/15/2014   Past Surgical History  Procedure Laterality Date  . Cholecystectomy    . Cesarean section    . Abdominal hysterectomy N/A 02/13/2013    Procedure: HYSTERECTOMY ABDOMINAL WITH BILATERAL SALPINGECTOMY WITH EXTENSIVE LYSIS OF ADHESIONS;  Surgeon: Princess Bruins, MD;  Location: [facility];  Service: Gynecology;  Laterality: N/A;  . Gastrectomy      partial   Family History  Problem Relation Age of Onset  . Cancer Mother     Bladder cancer  . Hypertension Mother      Living, 12  . COPD Mother   . Hypertension Sister   . Heart attack Maternal Grandfather   . Other Father     Died, 80s   History  Substance Use Topics  . Smoking status: Never Smoker   . Smokeless tobacco: Never Used  . Alcohol Use: 0.0 oz/week    0 Standard drinks or equivalent per week     Comment: rare   OB History    Gravida Para Term Preterm AB TAB SAB Ectopic Multiple Living   2 2 1 1      1      Review of Systems A 10 point review of systems was completed and was negative except for pertinent positives and negatives as mentioned in the history of present illness     Allergies  Chocolate flavor; Ivp dye; Shellfish allergy; and Strawberry  Home Medications   Prior to Admission medications   Medication Sig Start Date End Date Taking? Authorizing Provider  calcium carbonate (TUMS EX) 750 MG chewable tablet Chew by mouth.    Historical Provider, MD  cetirizine (ZYRTEC) 10 MG tablet Take 20 mg by mouth daily as needed (for itching).     Historical Provider, MD  FLUoxetine (PROZAC) 40 MG capsule Take 1 capsule (40 mg total) by mouth daily. 05/13/14   Rosalita Chessman, DO  imatinib (GLEEVEC) 400 MG tablet Take 1 tablet (400 mg total) by mouth daily. Take with meals and large  glass of water.Caution:Chemotherapy. 09/21/14   Eugenie Filler, MD  omeprazole (PRILOSEC) 20 MG capsule Take 20 mg by mouth daily.  03/04/14   Historical Provider, MD  oxyCODONE-acetaminophen (PERCOCET/ROXICET) 5-325 MG per tablet Take 1-2 tablets by mouth every 6 (six) hours as needed for severe pain. 02/22/15   Davonna Belling, MD  predniSONE (DELTASONE) 20 MG tablet Take 2 tablets (40 mg total) by mouth daily. 02/22/15   Davonna Belling, MD  prochlorperazine (COMPAZINE) 10 MG tablet Take 1 tablet by mouth every 6 (six) hours as needed for nausea or vomiting.  12/26/13   Historical Provider, MD  promethazine (PHENERGAN) 25 MG tablet Take 1 tablet (25 mg total) by mouth every 6 (six) hours as needed for  nausea or vomiting. 09/15/14   Mercedes Camprubi-Soms, PA-C  sucralfate (CARAFATE) 1 G tablet Take 1 tablet (1 g total) by mouth 4 (four) times daily -  with meals and at bedtime. 05/05/15   John Molpus, MD   BP 105/62 mmHg  Pulse 78  Temp(Src) 98.6 F (37 C) (Oral)  Resp 16  Ht 5\' 3"  (1.6 m)  Wt 375 lb (170.099 kg)  BMI 66.44 kg/m2  SpO2 99%  LMP 01/10/2013 Physical Exam  Constitutional: She is oriented to person, place, and time. She appears well-developed and well-nourished.  Morbidly obese  HENT:  Head: Normocephalic and atraumatic.  Mouth/Throat: Oropharynx is clear and moist.  Eyes: Conjunctivae are normal. Pupils are equal, round, and reactive to light. Right eye exhibits no discharge. Left eye exhibits no discharge. No scleral icterus.  Neck: Neck supple.  Cardiovascular: Normal rate, regular rhythm and normal heart sounds.   Pulmonary/Chest: Effort normal and breath sounds normal. No respiratory distress. She has no wheezes. She has no rales.  Abdominal: Soft.  Tenderness to palpation over epigastric region and somewhat over left upper quadrant. Abdomen otherwise soft, nondistended and nontender. No rebound or guarding.  Musculoskeletal: She exhibits no tenderness.  Neurological: She is alert and oriented to person, place, and time.  Cranial Nerves II-XII grossly intact  Skin: Skin is warm and dry. No rash noted.  Psychiatric: She has a normal mood and affect.  Nursing note and vitals reviewed.   ED Course  Procedures (including critical care time) Labs Review Labs Reviewed  COMPREHENSIVE METABOLIC PANEL - Abnormal; Notable for the following:    Calcium 8.5 (*)    All other components within normal limits  LIPASE, BLOOD - Abnormal; Notable for the following:    Lipase 15 (*)    All other components within normal limits  CBC WITH DIFFERENTIAL/PLATELET - Abnormal; Notable for the following:    Hemoglobin 11.8 (*)    HCT 35.7 (*)    Eosinophils Relative 11 (*)     Eosinophils Absolute 0.8 (*)    All other components within normal limits    Imaging Review Ct Abdomen Pelvis Wo Contrast  05/05/2015   ADDENDUM REPORT: 05/05/2015 03:09  ADDENDUM: Patient returns after the administration of oral contrast, images acquired through the abdomen. The stomach is now partially distended, status post partial gastrectomy, no mass though contrast-enhanced images may be more sensitive. Included small and large bowel are normal in course and caliber, unremarkable.   Electronically Signed   By: Elon Alas M.D.   On: 05/05/2015 03:09   05/05/2015   CLINICAL DATA:  Abdominal pain beginning this afternoon, worse after eating. Intermittent nausea. Status post partial gastrectomy, hysterectomy, cholecystectomy. History of gist tumor.  EXAM: CT ABDOMEN AND PELVIS WITHOUT  CONTRAST  TECHNIQUE: Multidetector CT imaging of the abdomen and pelvis was performed following the standard protocol without IV contrast.  COMPARISON:  CT abdomen pelvis September 15, 2014  FINDINGS: Large body habitus results in overall noisy image quality.  LUNG BASES: Included view of the lung bases are clear. The visualized heart and pericardium are unremarkable.  KIDNEYS/BLADDER: Kidneys are orthotopic, demonstrating normal size and morphology. No nephrolithiasis, hydronephrosis; limited assessment for renal masses on this nonenhanced examination. The unopacified ureters are normal in course and caliber. Urinary bladder is partially distended and unremarkable.  SOLID ORGANS: The liver, spleen, pancreas and adrenal glands are unremarkable for this non-contrast examination. Status post cholecystectomy.  GASTROINTESTINAL TRACT: Status post partial gastrectomy, decompressed stomach. The small and large bowel are normal in course and caliber without inflammatory changes, the sensitivity may be decreased by lack of enteric contrast. Normal appendix.  PERITONEUM/RETROPERITONEUM: Aortoiliac vessels are normal in course and  caliber. No lymphadenopathy by CT size criteria. Status post hysterectomy. No intraperitoneal free fluid nor free air. Phleboliths in the pelvis.  SOFT TISSUES/ OSSEOUS STRUCTURES: Nonsuspicious. Similar anterior abdominal wall scarring. Moderate L5-S1 disc height loss, vacuum disc and moderate facet arthropathy resulting in moderate neural foraminal narrowing.  IMPRESSION: No acute intra-abdominal or pelvic process on this habitus limited examination.  Status post partial gastrectomy, hysterectomy and cholecystectomy.  Electronically Signed: By: Elon Alas M.D. On: 05/05/2015 02:12     EKG Interpretation None     Meds given in ED:  Medications  gi cocktail (Maalox,Lidocaine,Donnatal) (30 mLs Oral Given 05/05/15 0042)  sucralfate (CARAFATE) tablet 1 g (1 g Oral Given 05/05/15 0305)    Discharge Medication List as of 05/05/2015  3:31 AM    START taking these medications   Details  sucralfate (CARAFATE) 1 G tablet Take 1 tablet (1 g total) by mouth 4 (four) times daily -  with meals and at bedtime., Starting 05/05/2015, Until Discontinued, Print       Filed Vitals:   05/05/15 0012 05/05/15 0307  BP: 118/69 105/62  Pulse: 90 78  Temp: 98.6 F (37 C)   TempSrc: Oral   Resp: 16 16  Height: 5\' 3"  (1.6 m)   Weight: 375 lb (170.099 kg)   SpO2: 100% 99%    MDM  Vitals stable - WNL -afebrile Pt resting comfortably in ED. Reports she feels much better after GI cocktail. PE--diffuse epigastric tenderness. Negative Murphy's, McBurney's Labwork--pending labs Imaging--pending abdominal noncontrast CT, due to patient allergy  DDX--patient with new abdominal discomfort not similar to previous with a history of gastric cancer. We will obtain basic labs, lipase as well as abdominal CT scan to further evaluate for possible metastases. PT care signed out to Dr. Florina Ou at shift change. Plan at this time, is to follow up on pending labs and imaging and reassess pt symptoms. If no new objective  findings, I feel pt may be discharged home to f/u with her PCP.  Final diagnoses:  Epigastric pain        Comer Locket, PA-C 05/05/15 1607

## 2015-05-05 NOTE — ED Provider Notes (Addendum)
Nursing notes and vitals signs, including pulse oximetry, reviewed.  Summary of this visit's results, reviewed by myself:  Labs:  Results for orders placed or performed during the hospital encounter of 05/05/15 (from the past 24 hour(s))  Comprehensive metabolic panel     Status: Abnormal   Collection Time: 05/05/15 12:50 AM  Result Value Ref Range   Sodium 136 135 - 145 mmol/L   Potassium 3.9 3.5 - 5.1 mmol/L   Chloride 102 101 - 111 mmol/L   CO2 26 22 - 32 mmol/L   Glucose, Bld 97 65 - 99 mg/dL   BUN 10 6 - 20 mg/dL   Creatinine, Ser 0.56 0.44 - 1.00 mg/dL   Calcium 8.5 (L) 8.9 - 10.3 mg/dL   Total Protein 7.3 6.5 - 8.1 g/dL   Albumin 3.5 3.5 - 5.0 g/dL   AST 21 15 - 41 U/L   ALT 20 14 - 54 U/L   Alkaline Phosphatase 90 38 - 126 U/L   Total Bilirubin 0.3 0.3 - 1.2 mg/dL   GFR calc non Af Amer >60 >60 mL/min   GFR calc Af Amer >60 >60 mL/min   Anion gap 8 5 - 15  Lipase, blood     Status: Abnormal   Collection Time: 05/05/15 12:50 AM  Result Value Ref Range   Lipase 15 (L) 22 - 51 U/L  CBC with Differential     Status: Abnormal   Collection Time: 05/05/15 12:50 AM  Result Value Ref Range   WBC 7.7 4.0 - 10.5 K/uL   RBC 4.02 3.87 - 5.11 MIL/uL   Hemoglobin 11.8 (L) 12.0 - 15.0 g/dL   HCT 35.7 (L) 36.0 - 46.0 %   MCV 88.8 78.0 - 100.0 fL   MCH 29.4 26.0 - 34.0 pg   MCHC 33.1 30.0 - 36.0 g/dL   RDW 14.5 11.5 - 15.5 %   Platelets PLATELET CLUMPS NOTED ON SMEAR, UNABLE TO ESTIMATE 150 - 400 K/uL   Neutrophils Relative % 48 43 - 77 %   Neutro Abs 3.7 1.7 - 7.7 K/uL   Lymphocytes Relative 36 12 - 46 %   Lymphs Abs 2.7 0.7 - 4.0 K/uL   Monocytes Relative 5 3 - 12 %   Monocytes Absolute 0.4 0.1 - 1.0 K/uL   Eosinophils Relative 11 (H) 0 - 5 %   Eosinophils Absolute 0.8 (H) 0.0 - 0.7 K/uL   Basophils Relative 0 0 - 1 %   Basophils Absolute 0.0 0.0 - 0.1 K/uL   WBC Morphology WHITE COUNT CONFIRMED ON SMEAR    Smear Review LARGE PLATELETS PRESENT     Imaging Studies: Ct  Abdomen Pelvis Wo Contrast  05/05/2015   ADDENDUM REPORT: 05/05/2015 03:09  ADDENDUM: Patient returns after the administration of oral contrast, images acquired through the abdomen. The stomach is now partially distended, status post partial gastrectomy, no mass though contrast-enhanced images may be more sensitive. Included small and large bowel are normal in course and caliber, unremarkable.   Electronically Signed   By: Elon Alas M.D.   On: 05/05/2015 03:09   05/05/2015   CLINICAL DATA:  Abdominal pain beginning this afternoon, worse after eating. Intermittent nausea. Status post partial gastrectomy, hysterectomy, cholecystectomy. History of gist tumor.  EXAM: CT ABDOMEN AND PELVIS WITHOUT CONTRAST  TECHNIQUE: Multidetector CT imaging of the abdomen and pelvis was performed following the standard protocol without IV contrast.  COMPARISON:  CT abdomen pelvis September 15, 2014  FINDINGS: Large body habitus results  in overall noisy image quality.  LUNG BASES: Included view of the lung bases are clear. The visualized heart and pericardium are unremarkable.  KIDNEYS/BLADDER: Kidneys are orthotopic, demonstrating normal size and morphology. No nephrolithiasis, hydronephrosis; limited assessment for renal masses on this nonenhanced examination. The unopacified ureters are normal in course and caliber. Urinary bladder is partially distended and unremarkable.  SOLID ORGANS: The liver, spleen, pancreas and adrenal glands are unremarkable for this non-contrast examination. Status post cholecystectomy.  GASTROINTESTINAL TRACT: Status post partial gastrectomy, decompressed stomach. The small and large bowel are normal in course and caliber without inflammatory changes, the sensitivity may be decreased by lack of enteric contrast. Normal appendix.  PERITONEUM/RETROPERITONEUM: Aortoiliac vessels are normal in course and caliber. No lymphadenopathy by CT size criteria. Status post hysterectomy. No intraperitoneal free  fluid nor free air. Phleboliths in the pelvis.  SOFT TISSUES/ OSSEOUS STRUCTURES: Nonsuspicious. Similar anterior abdominal wall scarring. Moderate L5-S1 disc height loss, vacuum disc and moderate facet arthropathy resulting in moderate neural foraminal narrowing.  IMPRESSION: No acute intra-abdominal or pelvic process on this habitus limited examination.  Status post partial gastrectomy, hysterectomy and cholecystectomy.  Electronically Signed: By: Elon Alas M.D. On: 05/05/2015 02:12   Medical screening examination/treatment/procedure(s) were conducted as a shared visit with non-physician practitioner(s) and myself.  I personally evaluated the patient during the encounter.  3:28 AM Patient's pain is better after GI cocktail and Carafate. She is already on omeprazole. We will add Carafate. Her abdomen is soft with epigastric tenderness. She was advised of her lab and x-ray findings.   Shanon Rosser, MD 05/05/15 [id number]  Shanon Rosser, MD 05/05/15 [phone]

## 2015-05-05 NOTE — ED Notes (Signed)
Patient states that she started to have abdominal pain this afternoon. Was mild and then ate and it became worse. The patient reports that she has intermittent nausea

## 2015-11-14 ENCOUNTER — Encounter (HOSPITAL_BASED_OUTPATIENT_CLINIC_OR_DEPARTMENT_OTHER): Payer: Self-pay | Admitting: *Deleted

## 2015-11-14 ENCOUNTER — Emergency Department (HOSPITAL_BASED_OUTPATIENT_CLINIC_OR_DEPARTMENT_OTHER): Payer: BLUE CROSS/BLUE SHIELD

## 2015-11-14 ENCOUNTER — Emergency Department (HOSPITAL_BASED_OUTPATIENT_CLINIC_OR_DEPARTMENT_OTHER)
Admission: EM | Admit: 2015-11-14 | Discharge: 2015-11-14 | Disposition: A | Discharge status: Home / Self Care | Payer: BLUE CROSS/BLUE SHIELD | Attending: Emergency Medicine | Admitting: Emergency Medicine

## 2015-11-14 DIAGNOSIS — K219 Gastro-esophageal reflux disease without esophagitis: Secondary | ICD-10-CM | POA: Diagnosis not present

## 2015-11-14 DIAGNOSIS — X58XXXA Exposure to other specified factors, initial encounter: Secondary | ICD-10-CM | POA: Diagnosis not present

## 2015-11-14 DIAGNOSIS — Z7952 Long term (current) use of systemic steroids: Secondary | ICD-10-CM | POA: Diagnosis not present

## 2015-11-14 DIAGNOSIS — M25562 Pain in left knee: Secondary | ICD-10-CM

## 2015-11-14 DIAGNOSIS — Z86018 Personal history of other benign neoplasm: Secondary | ICD-10-CM | POA: Diagnosis not present

## 2015-11-14 DIAGNOSIS — S8992XA Unspecified injury of left lower leg, initial encounter: Secondary | ICD-10-CM | POA: Diagnosis not present

## 2015-11-14 DIAGNOSIS — Y998 Other external cause status: Secondary | ICD-10-CM | POA: Diagnosis not present

## 2015-11-14 DIAGNOSIS — F419 Anxiety disorder, unspecified: Secondary | ICD-10-CM | POA: Diagnosis not present

## 2015-11-14 DIAGNOSIS — Y9289 Other specified places as the place of occurrence of the external cause: Secondary | ICD-10-CM | POA: Diagnosis not present

## 2015-11-14 DIAGNOSIS — Y9301 Activity, walking, marching and hiking: Secondary | ICD-10-CM | POA: Insufficient documentation

## 2015-11-14 DIAGNOSIS — Z862 Personal history of diseases of the blood and blood-forming organs and certain disorders involving the immune mechanism: Secondary | ICD-10-CM | POA: Insufficient documentation

## 2015-11-14 DIAGNOSIS — Z79899 Other long term (current) drug therapy: Secondary | ICD-10-CM | POA: Diagnosis not present

## 2015-11-14 DIAGNOSIS — G2581 Restless legs syndrome: Secondary | ICD-10-CM | POA: Insufficient documentation

## 2015-11-14 DIAGNOSIS — F329 Major depressive disorder, single episode, unspecified: Secondary | ICD-10-CM | POA: Diagnosis not present

## 2015-11-14 DIAGNOSIS — Z8509 Personal history of malignant neoplasm of other digestive organs: Secondary | ICD-10-CM | POA: Insufficient documentation

## 2015-11-14 MED ORDER — OXYCODONE-ACETAMINOPHEN 5-325 MG PO TABS
1.0000 | ORAL_TABLET | Freq: Once | ORAL | Status: AC
  Administered 2015-11-14: 1 via ORAL
  Filled 2015-11-14: qty 1

## 2015-11-14 MED ORDER — IBUPROFEN 600 MG PO TABS: 600.0000 mg | ORAL_TABLET | Freq: Four times a day (QID) | ORAL | Status: DC | PRN

## 2015-11-14 NOTE — ED Notes (Addendum)
Pt back from x-ray.

## 2015-11-14 NOTE — ED Notes (Signed)
Patient was walking down the stairs and felt her left knee pop  & then felt intense pain. Hurts to ambulate

## 2015-11-14 NOTE — ED Provider Notes (Signed)
CSN: [id number]     Arrival date & time 11/14/15  1059 History   First MD Initiated Contact with Patient 11/14/15 1110     Chief Complaint  Patient presents with  . Knee Pain     (Consider location/radiation/quality/duration/timing/severity/associated sxs/prior Treatment) Patient is a 45 y.o. female presenting with knee pain. The history is provided by the patient.  Knee Pain Location:  Knee Time since incident:  1 hour Injury: yes   Mechanism of injury comment:  Walking down stairs, felt a pop Knee location:  L knee Pain details:    Quality:  Aching   Radiates to:  Does not radiate   Severity:  Moderate   Onset quality:  Sudden   Timing:  Constant   Progression:  Unchanged Chronicity:  New Dislocation: no   Foreign body present:  No foreign bodies Prior injury to area:  No Worsened by:  Nothing tried Ineffective treatments:  None tried Associated symptoms: no back pain, no decreased ROM, no numbness, no stiffness, no swelling and no tingling   Risk factors: obesity     Past Medical History  Diagnosis Date  . Fibroid   . Depression   . Seasonal allergies   . Shortness of breath     On exhertion,recently,with anemia per pt.  . Anemia   . Headache(784.0)   . GERD (gastroesophageal reflux disease)     Takes Tums prn.  . Restless leg syndrome   . Anxiety     Last visit to [facility] 2008 gave birth to stillborn, pt teary today discussing  . Stromal tumor of digestive system   . Cancer ([place])   . Gastrointestinal stromal tumor (GIST)   . Nausea and vomiting 09/15/2014   Past Surgical History  Procedure Laterality Date  . Cholecystectomy    . Cesarean section    . Abdominal hysterectomy N/A 02/13/2013    Procedure: HYSTERECTOMY ABDOMINAL WITH BILATERAL SALPINGECTOMY WITH EXTENSIVE LYSIS OF ADHESIONS;  Surgeon: Princess Bruins, MD;  Location: [facility];  Service: Gynecology;  Laterality: N/A;  . Gastrectomy      partial   Family History  Problem Relation Age of Onset  .  Cancer Mother     Bladder cancer  . Hypertension Mother     Living, 38  . COPD Mother   . Hypertension Sister   . Heart attack Maternal Grandfather   . Other Father     Died, 49s   Social History  Substance Use Topics  . Smoking status: Never Smoker   . Smokeless tobacco: Never Used  . Alcohol Use: 0.0 oz/week    0 Standard drinks or equivalent per week     Comment: rare   OB History    Gravida Para Term Preterm AB TAB SAB Ectopic Multiple Living   2 2 1 1      1      Review of Systems  Musculoskeletal: Negative for back pain and stiffness.  All other systems reviewed and are negative.     Allergies  Chocolate flavor; Ivp dye; Shellfish allergy; and Strawberry extract  Home Medications   Prior to Admission medications   Medication Sig Start Date End Date Taking? Authorizing Provider  TRAMADOL HCL PO Take by mouth.   Yes Historical Provider, MD  calcium carbonate (TUMS EX) 750 MG chewable tablet Chew by mouth.    Historical Provider, MD  cetirizine (ZYRTEC) 10 MG tablet Take 20 mg by mouth daily as needed (for itching).     Historical Provider, MD  FLUoxetine (PROZAC) 40 MG capsule Take 1 capsule (40 mg total) by mouth daily. 05/13/14   Rosalita Chessman, DO  imatinib (GLEEVEC) 400 MG tablet Take 1 tablet (400 mg total) by mouth daily. Take with meals and large glass of water.Caution:Chemotherapy. 09/21/14   Eugenie Filler, MD  omeprazole (PRILOSEC) 20 MG capsule Take 20 mg by mouth daily.  03/04/14   Historical Provider, MD  oxyCODONE-acetaminophen (PERCOCET/ROXICET) 5-325 MG per tablet Take 1-2 tablets by mouth every 6 (six) hours as needed for severe pain. 02/22/15   Davonna Belling, MD  predniSONE (DELTASONE) 20 MG tablet Take 2 tablets (40 mg total) by mouth daily. 02/22/15   Davonna Belling, MD  prochlorperazine (COMPAZINE) 10 MG tablet Take 1 tablet by mouth every 6 (six) hours as needed for nausea or vomiting.  12/26/13   Historical Provider, MD  promethazine (PHENERGAN)  25 MG tablet Take 1 tablet (25 mg total) by mouth every 6 (six) hours as needed for nausea or vomiting. 09/15/14   Mercedes Camprubi-Soms, PA-C  sucralfate (CARAFATE) 1 G tablet Take 1 tablet (1 g total) by mouth 4 (four) times daily -  with meals and at bedtime. 05/05/15   John Molpus, MD   BP 130/108 mmHg  Pulse 96  Temp(Src) 98 F (36.7 C) (Oral)  Resp 24  Ht 5\' 3"  (1.6 m)  Wt 382 lb (173.274 kg)  BMI 67.69 kg/m2  SpO2 100%  LMP 01/10/2013 Physical Exam  Constitutional: She is oriented to person, place, and time. She appears well-developed and well-nourished. No distress.  Morbidly obese  HENT:  Head: Normocephalic.  Eyes: Conjunctivae are normal.  Neck: Neck supple. No tracheal deviation present.  Cardiovascular: Normal rate and regular rhythm.   Pulmonary/Chest: Effort normal. No respiratory distress.  Abdominal: Soft. She exhibits no distension.  Musculoskeletal:       Left knee: She exhibits normal range of motion, no swelling, no effusion, no ecchymosis, no deformity, normal alignment, no LCL laxity, normal patellar mobility, no bony tenderness, normal meniscus and no MCL laxity. Tenderness found.       Legs: Neurological: She is alert and oriented to person, place, and time.  Skin: Skin is warm and dry.  Psychiatric: She has a normal mood and affect.    ED Course  Procedures (including critical care time) Labs Review Labs Reviewed - No data to display  Imaging Review Dg Knee Complete 4 Views Left  11/14/2015  CLINICAL DATA:  Left knee pain starting today. Happened while walking down stairs. Unable to bear weight. Initial encounter. EXAM: LEFT KNEE - COMPLETE 4+ VIEW COMPARISON:  None. FINDINGS: There is no evidence of fracture, dislocation, or joint effusion. There is no evidence of arthropathy or other focal bone abnormality. Soft tissues are unremarkable. IMPRESSION: Negative. Electronically Signed   By: Misty Stanley M.D.   On: 11/14/2015 12:01   I have personally  reviewed and evaluated these images and lab results as part of my medical decision-making.   EKG Interpretation None      MDM   Final diagnoses:  Left knee pain    45 y.o. female presents with left knee pain that started suddenly while walking downstairs today over the lateral portion of her leg distal to the knee. She is writhing in pain on arrival with no evidence of deformity, external injury. X-rays negative for acute bony injury. All ligaments and tendons appear intact clinically. Towards the end of the exam patient admitted that she has had this pain ongoing  for roughly a year. At this time I do not feel the patient would benefit from decreasing weightbearing status or immobilization given her lack of objective injury. Scheduled NSAIDs and other supportive therapies including strengthening of leg muscle surrounding the joints and weight loss were recommended.    Leo Grosser, MD 11/14/15 [phone]

## 2015-11-14 NOTE — ED Notes (Signed)
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 Has been having ongoing joint pain in lt hands and lower legs. Takes ultram  daily for chronic pain. See primary care in [place] that is affiliated with [facility] and along with Oncologist at [facility].

## 2015-11-14 NOTE — Discharge Instructions (Signed)
Generic Knee Exercises EXERCISES RANGE OF MOTION (ROM) AND STRETCHING EXERCISES These exercises may help you when beginning to rehabilitate your injury. Your symptoms may resolve with or without further involvement from your physician, physical therapist, or athletic trainer. While completing these exercises, remember:   Restoring tissue flexibility helps normal motion to return to the joints. This allows healthier, less painful movement and activity.  An effective stretch should be held for at least 30 seconds.  A stretch should never be painful. You should only feel a gentle lengthening or release in the stretched tissue. STRETCH - Knee Extension, Prone  Lie on your stomach on a firm surface, such as a bed or countertop. Place your right / left knee and leg just beyond the edge of the surface. You may wish to place a towel under the far end of your right / left thigh for comfort.  Relax your leg muscles and allow gravity to straighten your knee. Your clinician may advise you to add an ankle weight if more resistance is helpful for you.  You should feel a stretch in the back of your right / left knee. Hold this position for __________ seconds. Repeat __________ times. Complete this stretch __________ times per day. * Your physician, physical therapist, or athletic trainer may ask you to add ankle weight to enhance your stretch.  RANGE OF MOTION - Knee Flexion, Active  Lie on your back with both knees straight. (If this causes back discomfort, bend your opposite knee, placing your foot flat on the floor.)  Slowly slide your heel back toward your buttocks until you feel a gentle stretch in the front of your knee or thigh.  Hold for __________ seconds. Slowly slide your heel back to the starting position. Repeat __________ times. Complete this exercise __________ times per day.  STRETCH - Quadriceps, Prone   Lie on your stomach on a firm surface, such as a bed or padded floor.  Bend your  right / left knee and grasp your ankle. If you are unable to reach your ankle or pant leg, use a belt around your foot to lengthen your reach.  Gently pull your heel toward your buttocks. Your knee should not slide out to the side. You should feel a stretch in the front of your thigh and/or knee.  Hold this position for __________ seconds. Repeat __________ times. Complete this stretch __________ times per day.  STRETCH - Hamstrings, Supine   Lie on your back. Loop a belt or towel over the ball of your right / left foot.  Straighten your right / left knee and slowly pull on the belt to raise your leg. Do not allow the right / left knee to bend. Keep your opposite leg flat on the floor.  Raise the leg until you feel a gentle stretch behind your right / left knee or thigh. Hold this position for __________ seconds. Repeat __________ times. Complete this stretch __________ times per day.  STRENGTHENING EXERCISES These exercises may help you when beginning to rehabilitate your injury. They may resolve your symptoms with or without further involvement from your physician, physical therapist, or athletic trainer. While completing these exercises, remember:   Muscles can gain both the endurance and the strength needed for everyday activities through controlled exercises.  Complete these exercises as instructed by your physician, physical therapist, or athletic trainer. Progress the resistance and repetitions only as guided.  You may experience muscle soreness or fatigue, but the pain or discomfort you are trying to   eliminate should never worsen during these exercises. If this pain does worsen, stop and make certain you are following the directions exactly. If the pain is still present after adjustments, discontinue the exercise until you can discuss the trouble with your clinician. STRENGTH - Quadriceps, Isometrics  Lie on your back with your right / left leg extended and your opposite knee  bent.  Gradually tense the muscles in the front of your right / left thigh. You should see either your knee cap slide up toward your hip or increased dimpling just above the knee. This motion will push the back of the knee down toward the floor/mat/bed on which you are lying.  Hold the muscle as tight as you can without increasing your pain for __________ seconds.  Relax the muscles slowly and completely in between each repetition. Repeat __________ times. Complete this exercise __________ times per day.  STRENGTH - Quadriceps, Short Arcs   Lie on your back. Place a __________ inch towel roll under your knee so that the knee slightly bends.  Raise only your lower leg by tightening the muscles in the front of your thigh. Do not allow your thigh to rise.  Hold this position for __________ seconds. Repeat __________ times. Complete this exercise __________ times per day.  OPTIONAL ANKLE WEIGHTS: Begin with ____________________, but DO NOT exceed ____________________. Increase in 1 pound/0.5 kilogram increments.  STRENGTH - Quadriceps, Straight Leg Raises  Quality counts! Watch for signs that the quadriceps muscle is working to insure you are strengthening the correct muscles and not "cheating" by substituting with healthier muscles.  Lay on your back with your right / left leg extended and your opposite knee bent.  Tense the muscles in the front of your right / left thigh. You should see either your knee cap slide up or increased dimpling just above the knee. Your thigh may even quiver.  Tighten these muscles even more and raise your leg 4 to 6 inches off the floor. Hold for __________ seconds.  Keeping these muscles tense, lower your leg.  Relax the muscles slowly and completely in between each repetition. Repeat __________ times. Complete this exercise __________ times per day.  STRENGTH - Hamstring, Curls  Lay on your stomach with your legs extended. (If you lay on a bed, your feet  may hang over the edge.)  Tighten the muscles in the back of your thigh to bend your right / left knee up to 90 degrees. Keep your hips flat on the bed/floor.  Hold this position for __________ seconds.  Slowly lower your leg back to the starting position. Repeat __________ times. Complete this exercise __________ times per day.  OPTIONAL ANKLE WEIGHTS: Begin with ____________________, but DO NOT exceed ____________________. Increase in 1 pound/0.5 kilogram increments.  STRENGTH - Quadriceps, Squats  Stand in a door frame so that your feet and knees are in line with the frame.  Use your hands for balance, not support, on the frame.  Slowly lower your weight, bending at the hips and knees. Keep your lower legs upright so that they are parallel with the door frame. Squat only within the range that does not increase your knee pain. Never let your hips drop below your knees.  Slowly return upright, pushing with your legs, not pulling with your hands. Repeat __________ times. Complete this exercise __________ times per day.  STRENGTH - Quadriceps, Wall Slides  Follow guidelines for form closely. Increased knee pain often results from poorly placed feet or knees.    Lean against a smooth wall or door and walk your feet out 18-24 inches. Place your feet hip-width apart.  Slowly slide down the wall or door until your knees bend __________ degrees.* Keep your knees over your heels, not your toes, and in line with your hips, not falling to either side.  Hold for __________ seconds. Stand up to rest for __________ seconds in between each repetition. Repeat __________ times. Complete this exercise __________ times per day. * Your physician, physical therapist, or athletic trainer will alter this angle based on your symptoms and progress.   This information is not intended to replace advice given to you by your health care provider. Make sure you discuss any questions you have with your health care  provider.   Document Released: 08/17/2005 Document Revised: 10/24/2014 Document Reviewed: 01/15/2009 Elsevier Interactive Patient Education 2016 [employer] Inc.  

## 2017-08-01 ENCOUNTER — Encounter (HOSPITAL_BASED_OUTPATIENT_CLINIC_OR_DEPARTMENT_OTHER): Payer: Self-pay | Admitting: Emergency Medicine

## 2017-08-01 ENCOUNTER — Emergency Department (HOSPITAL_BASED_OUTPATIENT_CLINIC_OR_DEPARTMENT_OTHER): Payer: BLUE CROSS/BLUE SHIELD

## 2017-08-01 ENCOUNTER — Emergency Department (HOSPITAL_BASED_OUTPATIENT_CLINIC_OR_DEPARTMENT_OTHER)
Admission: EM | Admit: 2017-08-01 | Discharge: 2017-08-01 | Disposition: A | Discharge status: Home / Self Care | Payer: BLUE CROSS/BLUE SHIELD | Attending: Emergency Medicine | Admitting: Emergency Medicine

## 2017-08-01 DIAGNOSIS — Z79899 Other long term (current) drug therapy: Secondary | ICD-10-CM | POA: Diagnosis not present

## 2017-08-01 DIAGNOSIS — E876 Hypokalemia: Secondary | ICD-10-CM | POA: Diagnosis not present

## 2017-08-01 DIAGNOSIS — Z8509 Personal history of malignant neoplasm of other digestive organs: Secondary | ICD-10-CM | POA: Insufficient documentation

## 2017-08-01 DIAGNOSIS — R1013 Epigastric pain: Secondary | ICD-10-CM | POA: Diagnosis present

## 2017-08-01 DIAGNOSIS — Z9884 Bariatric surgery status: Secondary | ICD-10-CM | POA: Diagnosis not present

## 2017-08-01 DIAGNOSIS — K567 Ileus, unspecified: Secondary | ICD-10-CM | POA: Diagnosis not present

## 2017-08-01 LAB — COMPREHENSIVE METABOLIC PANEL
ALBUMIN: 3.6 g/dL (ref 3.5–5.0)
ALT: 19 U/L (ref 14–54)
ANION GAP: 9 (ref 5–15)
AST: 25 U/L (ref 15–41)
Alkaline Phosphatase: 103 U/L (ref 38–126)
BUN: 8 mg/dL (ref 6–20)
CHLORIDE: 101 mmol/L (ref 101–111)
CO2: 28 mmol/L (ref 22–32)
Calcium: 9.3 mg/dL (ref 8.9–10.3)
Creatinine, Ser: 0.49 mg/dL (ref 0.44–1.00)
GFR calc Af Amer: >60 mL/min (ref >60)
GFR calc non Af Amer: >60 mL/min (ref >60)
GLUCOSE: 93 mg/dL (ref 65–99)
POTASSIUM: 2.9 mmol/L — AB (ref 3.5–5.1)
SODIUM: 138 mmol/L (ref 135–145)
Total Bilirubin: 0.5 mg/dL (ref 0.3–1.2)
Total Protein: 7.7 g/dL (ref 6.5–8.1)

## 2017-08-01 LAB — HCG, SERUM, QUALITATIVE: PREG SERUM: NEGATIVE

## 2017-08-01 LAB — CBC WITH DIFFERENTIAL/PLATELET
BASOS PCT: 0 %
Basophils Absolute: 0 10*3/uL (ref 0.0–0.1)
Eosinophils Absolute: 0.3 10*3/uL (ref 0.0–0.7)
Eosinophils Relative: 3 %
HCT: 38.4 % (ref 36.0–46.0)
Hemoglobin: 12.9 g/dL (ref 12.0–15.0)
LYMPHS ABS: 3.3 10*3/uL (ref 0.7–4.0)
Lymphocytes Relative: 39 %
MCH: 29.6 pg (ref 26.0–34.0)
MCHC: 33.6 g/dL (ref 30.0–36.0)
MCV: 88.1 fL (ref 78.0–100.0)
MONOS PCT: 5 %
Monocytes Absolute: 0.4 10*3/uL (ref 0.1–1.0)
NEUTROS ABS: 4.5 10*3/uL (ref 1.7–7.7)
NEUTROS PCT: 53 %
PLATELETS: 268 10*3/uL (ref 150–400)
RBC: 4.36 MIL/uL (ref 3.87–5.11)
RDW: 14.7 % (ref 11.5–15.5)
WBC: 8.5 10*3/uL (ref 4.0–10.5)

## 2017-08-01 LAB — LIPASE, BLOOD: Lipase: 28 U/L (ref 11–51)

## 2017-08-01 MED ORDER — SODIUM CHLORIDE 0.9 % IV BOLUS (SEPSIS)
1000.0000 mL | Freq: Once | INTRAVENOUS | Status: AC
Start: 2017-08-01 — End: 2017-08-01
  Administered 2017-08-01: 1000 mL via INTRAVENOUS

## 2017-08-01 MED ORDER — MORPHINE SULFATE (PF) 4 MG/ML IV SOLN
4.0000 mg | Freq: Once | INTRAVENOUS | Status: AC
  Administered 2017-08-01: 4 mg via INTRAVENOUS
  Filled 2017-08-01: qty 1

## 2017-08-01 MED ORDER — MORPHINE SULFATE (PF) 4 MG/ML IV SOLN
4.0000 mg | Freq: Once | INTRAVENOUS | Status: AC
  Administered 2017-08-01: 4 mg via INTRAVENOUS

## 2017-08-01 MED ORDER — MORPHINE SULFATE (PF) 4 MG/ML IV SOLN
INTRAVENOUS | Status: AC
  Filled 2017-08-01: qty 1

## 2017-08-01 MED ORDER — POTASSIUM CHLORIDE CRYS ER 20 MEQ PO TBCR: 20.0000 meq | EXTENDED_RELEASE_TABLET | Freq: Two times a day (BID) | ORAL | 0 refills | Status: DC

## 2017-08-01 MED ORDER — HYDROCODONE-ACETAMINOPHEN 5-325 MG PO TABS: 1.0000 | ORAL_TABLET | Freq: Four times a day (QID) | ORAL | 0 refills | Status: DC | PRN

## 2017-08-01 MED ORDER — ONDANSETRON HCL 4 MG/2ML IJ SOLN
4.0000 mg | Freq: Once | INTRAMUSCULAR | Status: AC
  Administered 2017-08-01: 4 mg via INTRAVENOUS
  Filled 2017-08-01: qty 2

## 2017-08-01 NOTE — Discharge Instructions (Signed)
Hydrocodone as prescribed as needed for pain.  Potassium as prescribed.  Follow-up with your general surgeon later this week for a recheck, and go to the emergency room if you develop worsening pain, high fevers, bloody stool or vomit, or other new and concerning symptoms.

## 2017-08-01 NOTE — ED Notes (Signed)
ED Provider at bedside. 

## 2017-08-01 NOTE — ED Provider Notes (Signed)
[facility] EMERGENCY DEPARTMENT Provider Note   CSN: [id number] Arrival date & time: 08/01/17  0015     History   Chief Complaint Chief Complaint  Patient presents with  . Abdominal Pain    HPI Stacy Hill is a 46 y.o. female.  Patient is a 46 year old female with history of morbid obesity status post gastric sleeve surgery at [facility]. She presents today for evaluation of epigastric pain had started several hours ago. Her pain is severe and started after eating broccoli for the first time since her surgery. She feels nauseated but has not vomited. She denies any fevers or chills.   The history is provided by the patient.  Abdominal Pain   This is a new problem. The current episode started 1 to 2 hours ago. The problem occurs constantly. The problem has been rapidly worsening. The pain is associated with eating. The pain is located in the epigastric region. The quality of the pain is cramping. The pain is severe. Pertinent negatives include fever and constipation. Nothing aggravates the symptoms. Nothing relieves the symptoms.    Past Medical History:  Diagnosis Date  . Anemia   . Anxiety    Last visit to [facility] 2008 gave birth to stillborn, pt teary today discussing  . Cancer ([place])   . Depression   . Fibroid   . Gastrointestinal stromal tumor (GIST) ([place])   . GERD (gastroesophageal reflux disease)    Takes Tums prn.  . Headache(784.0)   . Nausea and vomiting 09/15/2014  . Restless leg syndrome   . Seasonal allergies   . Shortness of breath    On exhertion,recently,with anemia per pt.  . Stromal tumor of digestive system [facility])     Patient Active Problem List   Diagnosis Date Noted  . Gastrointestinal stromal neoplasm ([place]) 02/25/2015  . Clinical depression 02/25/2015  . Right knee pain 02/25/2015  . Abdominal pain   . Gastroenteritis due to norovirus 09/17/2014  . Orthostatic hypotension   . Nausea vomiting and diarrhea   . Dizziness  and giddiness   . Intractable nausea and vomiting 09/15/2014  . Epigastric pain 09/15/2014  . Diarrhea 09/15/2014  . Gastroenteritis 09/15/2014  . Dilated pupil 07/24/2013  . 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 S/P hysterectomy 02/14/2013  . Fibroid     Past Surgical History:  Procedure Laterality Date  . ABDOMINAL HYSTERECTOMY N/A 02/13/2013   Procedure: HYSTERECTOMY ABDOMINAL WITH BILATERAL SALPINGECTOMY WITH EXTENSIVE LYSIS OF ADHESIONS;  Surgeon: Princess Bruins, MD;  Location: [facility];  Service: Gynecology;  Laterality: N/A;  . CESAREAN SECTION    . CHOLECYSTECTOMY    . GASTRECTOMY     partial  . LAPAROSCOPIC GASTRIC SLEEVE RESECTION      OB History    Gravida Para Term Preterm AB Living   2 2 1 1   1    SAB TAB Ectopic Multiple Live Births                   Home Medications    Prior to Admission medications   Medication Sig Start Date End Date Taking? Authorizing Provider  phentermine 37.5 MG capsule Take 37.5 mg by mouth every morning.   Yes [provider]  calcium carbonate (TUMS EX) 750 MG chewable tablet Chew by mouth.    [provider]  cetirizine (ZYRTEC) 10 MG tablet Take 20 mg by mouth daily as needed (for itching).     [provider]  FLUoxetine (  PROZAC) 40 MG capsule Take 1 capsule (40 mg total) by mouth daily. 05/13/14   Ann Held, DO  ibuprofen (ADVIL,MOTRIN) 600 MG tablet Take 1 tablet (600 mg total) by mouth every 6 (six) hours as needed. 11/14/15   Leo Grosser, MD  imatinib (GLEEVEC) 400 MG tablet Take 1 tablet (400 mg total) by mouth daily. Take with meals and large glass of water.Caution:Chemotherapy. 09/21/14   Eugenie Filler, MD  omeprazole (PRILOSEC) 20 MG capsule Take 20 mg by mouth daily.  03/04/14   [provider]  predniSONE (DELTASONE) 20 MG tablet Take 2 tablets (40 mg total) by mouth daily. 02/22/15   Davonna Belling, MD  prochlorperazine (COMPAZINE) 10 MG tablet Take 1  tablet by mouth every 6 (six) hours as needed for nausea or vomiting.  12/26/13   [provider]  promethazine (PHENERGAN) 25 MG tablet Take 1 tablet (25 mg total) by mouth every 6 (six) hours as needed for nausea or vomiting. 09/15/14   Street, Mercedes, PA-C  sucralfate (CARAFATE) 1 G tablet Take 1 tablet (1 g total) by mouth 4 (four) times daily -  with meals and at bedtime. 05/05/15   Molpus, John, MD  TRAMADOL HCL PO Take by mouth.    [provider]    Family History Family History  Problem Relation Age of Onset  . Cancer Mother        Bladder cancer  . Hypertension Mother        Living, 20  . COPD Mother   . Other Father        Died, 80s  . Hypertension Sister   . Heart attack Maternal Grandfather     Social History Social History  Substance Use Topics  . Smoking status: Never Smoker  . Smokeless tobacco: Never Used  . Alcohol use 0.0 oz/week     Comment: rare     Allergies   Chocolate flavor; Ivp dye [iodinated diagnostic agents]; Shellfish allergy; and Strawberry extract   Review of Systems Review of Systems  Constitutional: Negative for fever.  Gastrointestinal: Positive for abdominal pain. Negative for constipation.  All other systems reviewed and are negative.    Physical Exam Updated Vital Signs BP (!) 122/91   Pulse (!) 114   Temp 98.1 F (36.7 C) (Oral)   Resp 20   Ht 5\' 3"  (1.6 m)   Wt (!) 140.6 kg (310 lb)   LMP 01/10/2013   SpO2 100%   BMI 54.91 kg/m   Physical Exam  Constitutional: She is oriented to person, place, and time. She appears well-developed and well-nourished. No distress.  HENT:  Head: Normocephalic and atraumatic.  Neck: Normal range of motion. Neck supple.  Cardiovascular: Normal rate and regular rhythm.  Exam reveals no gallop and no friction rub.   No murmur heard. Pulmonary/Chest: Effort normal and breath sounds normal. No respiratory distress. She has no wheezes.  Abdominal: Soft. Bowel sounds are  normal. She exhibits no distension. There is tenderness. There is no rebound and no guarding.  There is tenderness to palpation in the epigastric region  Musculoskeletal: Normal range of motion.  Neurological: She is alert and oriented to person, place, and time.  Skin: Skin is warm and dry. She is not diaphoretic.  Nursing note and vitals reviewed.    ED Treatments / Results  Labs (all labs ordered are listed, but only abnormal results are displayed) Labs Reviewed  COMPREHENSIVE METABOLIC PANEL  LIPASE, BLOOD  CBC WITH DIFFERENTIAL/PLATELET  HCG, SERUM, QUALITATIVE    EKG  EKG Interpretation None       Radiology No results found.  Procedures Procedures (including critical care time)  Medications Ordered in ED Medications  sodium chloride 0.9 % bolus 1,000 mL (not administered)  morphine 4 MG/ML injection 4 mg (not administered)  ondansetron (ZOFRAN) injection 4 mg (not administered)     Initial Impression / Assessment and Plan / ED Course  I have reviewed the triage vital signs and the nursing notes.  Pertinent labs & imaging results that were available during my care of the patient were reviewed by me and considered in my medical decision making (see chart for details).  Patient with history of gastric sleeve surgery presenting with complaints of abdominal cramping Her laboratory studies are reassuring however CT scan shows what may be an ileus or early bowel obstruction. She was given medicine for pain and nausea and is now feeling better.  I've discussed the disposition with the patient who is requesting to go home. I have advised her that she should follow-up with her general surgeon if she experiences continuing problems and return to the emergency department if her symptoms worsen.   Final Clinical Impressions(s) / ED Diagnoses   Final diagnoses:  None    New Prescriptions New Prescriptions   No medications on file     Veryl Speak, MD 08/01/17  [phone]

## 2017-08-01 NOTE — ED Triage Notes (Signed)
Episodes of Stabbing epigastric pain approx every 2 minutes x2-3 hours.  Nausea but no vomiting or diarrhea.  Sts she introduced broccoli for the first time tonight since having the Gastric Sleeve in June.

## 2017-08-01 NOTE — ED Notes (Signed)
Patient transported to CT 

## 2018-03-27 ENCOUNTER — Emergency Department (HOSPITAL_BASED_OUTPATIENT_CLINIC_OR_DEPARTMENT_OTHER): Payer: BLUE CROSS/BLUE SHIELD

## 2018-03-27 ENCOUNTER — Emergency Department (HOSPITAL_BASED_OUTPATIENT_CLINIC_OR_DEPARTMENT_OTHER)
Admission: EM | Admit: 2018-03-27 | Discharge: 2018-03-27 | Disposition: A | Discharge status: Home / Self Care | Payer: BLUE CROSS/BLUE SHIELD | Attending: Emergency Medicine | Admitting: Emergency Medicine

## 2018-03-27 ENCOUNTER — Other Ambulatory Visit: Payer: Self-pay

## 2018-03-27 ENCOUNTER — Encounter (HOSPITAL_BASED_OUTPATIENT_CLINIC_OR_DEPARTMENT_OTHER): Payer: Self-pay | Admitting: Emergency Medicine

## 2018-03-27 DIAGNOSIS — J069 Acute upper respiratory infection, unspecified: Secondary | ICD-10-CM | POA: Diagnosis not present

## 2018-03-27 DIAGNOSIS — R05 Cough: Secondary | ICD-10-CM | POA: Diagnosis present

## 2018-03-27 DIAGNOSIS — Z79899 Other long term (current) drug therapy: Secondary | ICD-10-CM | POA: Insufficient documentation

## 2018-03-27 DIAGNOSIS — B9789 Other viral agents as the cause of diseases classified elsewhere: Secondary | ICD-10-CM | POA: Diagnosis not present

## 2018-03-27 LAB — RAPID STREP SCREEN (MED CTR MEBANE ONLY): STREPTOCOCCUS, GROUP A SCREEN (DIRECT): NEGATIVE

## 2018-03-27 MED ORDER — DEXAMETHASONE 4 MG PO TABS
10.0000 mg | ORAL_TABLET | Freq: Once | ORAL | Status: AC
  Administered 2018-03-27: 10 mg via ORAL
  Filled 2018-03-27: qty 1

## 2018-03-27 MED ORDER — IPRATROPIUM-ALBUTEROL 0.5-2.5 (3) MG/3ML IN SOLN
3.0000 mL | Freq: Once | RESPIRATORY_TRACT | Status: AC
  Administered 2018-03-27: 3 mL via RESPIRATORY_TRACT
  Filled 2018-03-27: qty 3

## 2018-03-27 NOTE — ED Provider Notes (Signed)
[place] EMERGENCY DEPARTMENT Provider Note   CSN: [id number] Arrival date & time: 03/27/18  0350     History   Chief Complaint Chief Complaint  Patient presents with  . URI    HPI Stacy Hill is a 47 y.o. female.  The history is provided by the patient.  She has history of GERD, gastrointestinal stromal tumor, bariatric surgery and comes in with cough for the last 6 days.  Cough is productive of a small amount of clear sputum.  There is associated sore throat.  She has not had fever, but has had chills and sweats.  She is now starting to feel little tightness in her chest.  She is also complaining of pain in the left side of her mouth.  She has treated herself with Mucinex without any benefit.  She denies any sick contacts.  She is a non-smoker.  Past Medical History:  Diagnosis Date  . Anemia   . Anxiety    Last visit to [facility] 2008 gave birth to stillborn, pt teary today discussing  . Cancer ([place])   . Depression   . Fibroid   . 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 GERD (gastroesophageal reflux disease)    Takes Tums prn.  . Headache(784.0)   . Nausea and vomiting 09/15/2014  . Restless leg syndrome   . Seasonal allergies   . Shortness of breath    On exhertion,recently,with anemia per pt.  . Stromal tumor of digestive system [facility])     Patient Active Problem List   Diagnosis Date Noted  . 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 Clinical depression 02/25/2015  . Right knee pain 02/25/2015  . Abdominal pain   . Gastroenteritis due to norovirus 09/17/2014  . Orthostatic hypotension   . Nausea vomiting and diarrhea   . Dizziness and giddiness   . Intractable nausea and vomiting 09/15/2014  . Epigastric pain 09/15/2014  . Diarrhea 09/15/2014  . Gastroenteritis 09/15/2014  . Dilated pupil 07/24/2013  . 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 S/P hysterectomy 02/14/2013  . Fibroid     Past Surgical History:    Procedure Laterality Date  . ABDOMINAL HYSTERECTOMY N/A 02/13/2013   Procedure: HYSTERECTOMY ABDOMINAL WITH BILATERAL SALPINGECTOMY WITH EXTENSIVE LYSIS OF ADHESIONS;  Surgeon: Princess Bruins, MD;  Location: [place] [facility];  Service: Gynecology;  Laterality: N/A;  . CESAREAN SECTION    . CHOLECYSTECTOMY    . GASTRECTOMY     partial  . LAPAROSCOPIC GASTRIC SLEEVE RESECTION       OB History    Gravida  2   Para  2   Term  1   Preterm  1   AB      Living  1     SAB      TAB      Ectopic      Multiple      Live Births               Home Medications    Prior to Admission medications   Medication Sig Start Date End Date Taking? Authorizing Provider  calcium carbonate (TUMS EX) 750 MG chewable tablet Chew by mouth.    [provider]  cetirizine (ZYRTEC) 10 MG tablet Take 20 mg by mouth daily as needed (for itching).     [provider]  FLUoxetine (PROZAC) 40 MG capsule Take 1 capsule (40 mg total) by mouth daily. 05/13/14   Ann Held, DO  HYDROcodone-acetaminophen (NORCO) 5-325 MG tablet Take 1-2 tablets by mouth every 6 (six) hours as needed. 08/01/17   Veryl Speak, MD  ibuprofen (ADVIL,MOTRIN) 600 MG tablet Take 1 tablet (600 mg total) by mouth every 6 (six) hours as needed. 11/14/15   Leo Grosser, MD  imatinib (GLEEVEC) 400 MG tablet Take 1 tablet (400 mg total) by mouth daily. Take with meals and large glass of water.Caution:Chemotherapy. 09/21/14   Eugenie Filler, MD  omeprazole (PRILOSEC) 20 MG capsule Take 20 mg by mouth daily.  03/04/14   [provider]  phentermine 37.5 MG capsule Take 37.5 mg by mouth every morning.    [provider]  potassium chloride SA (K-DUR,KLOR-CON) 20 MEQ tablet Take 1 tablet (20 mEq total) by mouth 2 (two) times daily. 08/01/17   Veryl Speak, MD  predniSONE (DELTASONE) 20 MG tablet Take 2 tablets (40 mg total) by mouth daily. 02/22/15   Davonna Belling, MD  prochlorperazine  (COMPAZINE) 10 MG tablet Take 1 tablet by mouth every 6 (six) hours as needed for nausea or vomiting.  12/26/13   [provider]  promethazine (PHENERGAN) 25 MG tablet Take 1 tablet (25 mg total) by mouth every 6 (six) hours as needed for nausea or vomiting. 09/15/14   Street, Mercedes, PA-C  sucralfate (CARAFATE) 1 G tablet Take 1 tablet (1 g total) by mouth 4 (four) times daily -  with meals and at bedtime. 05/05/15   Molpus, John, MD  TRAMADOL HCL PO Take by mouth.    [provider]    Family History Family History  Problem Relation Age of Onset  . Cancer Mother        Bladder cancer  . Hypertension Mother        Living, 30  . COPD Mother   . Other Father        Died, 80s  . Hypertension Sister   . Heart attack Maternal Grandfather     Social History Social History   Tobacco Use  . Smoking status: Never Smoker  . Smokeless tobacco: Never Used  Substance Use Topics  . Alcohol use: Yes    Alcohol/week: 0.0 oz    Comment: rare  . Drug use: No     Allergies   Chocolate flavor; Ivp dye [iodinated diagnostic agents]; Shellfish allergy; and Strawberry extract   Review of Systems Review of Systems  All other systems reviewed and are negative.    Physical Exam Updated Vital Signs BP 113/80 (BP Location: Left Arm)   Pulse 93   Temp 98.2 F (36.8 C) (Oral)   Resp 18   Ht 5\' 3"  (1.6 m)   Wt 111.1 kg (245 lb)   LMP 01/10/2013   SpO2 100%   BMI 43.40 kg/m   Physical Exam  Nursing note and vitals reviewed.  47 year old female, resting comfortably and in no acute distress. Vital signs are normal. Oxygen saturation is 100%, which is normal. Head is normocephalic and atraumatic. PERRLA, EOMI. Oropharynx is faintly erythematous.  Dentition appears grossly normal. Neck is nontender and supple without adenopathy or JVD. Back is nontender and there is no CVA tenderness. Lungs are clear without rales, wheezes, or rhonchi with tidal breathing.  Slight  wheezes noted with forced exhalation. Chest is nontender. Heart has regular rate and rhythm without murmur. Abdomen is soft, flat, nontender without masses or hepatosplenomegaly and peristalsis is normoactive. Extremities have no cyanosis or edema, full range of motion is present. Skin is warm and  dry without rash. Neurologic: Mental status is normal, cranial nerves are intact, there are no motor or sensory deficits.  ED Treatments / Results  Labs (all labs ordered are listed, but only abnormal results are displayed) Labs Reviewed  RAPID STREP SCREEN ([facility] & [facility] ONLY)  CULTURE, GROUP A STREP [facility])    Radiology Dg Chest 2 View  Result Date: 03/27/2018 CLINICAL DATA:  Cough and congestion. EXAM: CHEST - 2 VIEW COMPARISON:  Radiographs 08/19/2014 FINDINGS: The cardiomediastinal contours are normal. Mild central peribronchial thickening again seen. Pulmonary vasculature is normal. No consolidation, pleural effusion, or pneumothorax. No acute osseous abnormalities are seen. IMPRESSION: Mild central peribronchial thickening may represent chronic bronchitic change or recurrent bronchitis or asthma. No focal consolidation. Electronically Signed   By: Jeb Levering M.D.   On: 03/27/2018 05:50    Procedures Procedures   Medications Ordered in ED Medications  dexamethasone (DECADRON) tablet 10 mg (has no administration in time range)  ipratropium-albuterol (DUONEB) 0.5-2.5 (3) MG/3ML nebulizer solution 3 mL (3 mLs Nebulization Given 03/27/18 0501)     Initial Impression / Assessment and Plan / ED Course  I have reviewed the triage vital signs and the nursing notes.  Pertinent labs & imaging results that were available during my care of the patient were reviewed by me and considered in my medical decision making (see chart for details).  Respiratory tract infection, probably viral.  Will check strep screen and chest x-ray.  Will give therapeutic trial of albuterol with ipratropium.  Old  records reviewed, and she does have prior ED visits for upper respiratory infections.  Chest x-ray shows no evidence of pneumonia.  Strep screen is negative, culture pending.  At this point, no indication for antibiotics.  She did not had any symptomatic improvement with albuterol with ipratropium via nebulizer, so no indication for outpatient beta agonists.  Patient is given a dose of dexamethasone and discharged with instructions to continue over-the-counter medications as needed.  Return precautions discussed.  Final Clinical Impressions(s) / ED Diagnoses   Final diagnoses:  Viral URI with cough    ED Discharge Orders    None       Delora Fuel, MD 29/92/42 [phone]

## 2018-03-27 NOTE — Discharge Instructions (Addendum)
Drink plenty of fluids.  Take acetaminophen or ibuprofen as needed for fever or aching.  Continue taking Mucinex as needed.  Return if symptoms are getting worse.

## 2018-03-27 NOTE — ED Triage Notes (Signed)
Pt c/o cough, congestion, and sore throat x 6 days.

## 2018-03-29 LAB — CULTURE, GROUP A STREP (THRC)

## 2018-10-17 IMAGING — CT CT ABD-PELV W/O CM
2 of 4 series · 16 of 46 positions shown, 18 images · non-contrast
Comparison: 05/05/2015

CLINICAL DATA: mid abd pain x 4 hrs, gastric sleeve 6'18 w/ 80 lb
wt loss, gastric ca w/ tumor resection '15, finished chemo 9'17,
hysterectomy, ovaries remain, IV CONTRAST ALLERGY

EXAM:
CT ABDOMEN AND PELVIS WITHOUT CONTRAST
TECHNIQUE: Multidetector CT imaging of the abdomen and pelvis was performed
following the standard protocol without IV contrast.

[Series 2: axial st · axial · 0.98mm/px · z∈[-518,-43]mm · 13 of 105 slices shown, 15 images]
[im 5/105  soft-tissue]
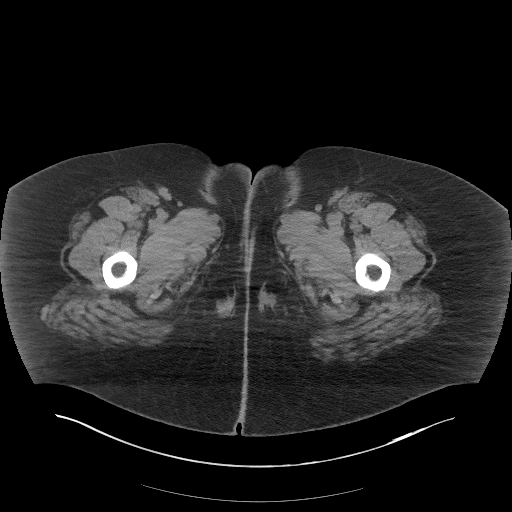
[im 5/105  bone]
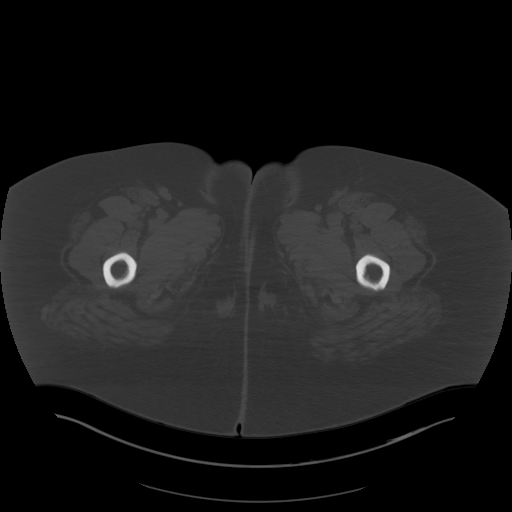
[im 14/105  soft-tissue]
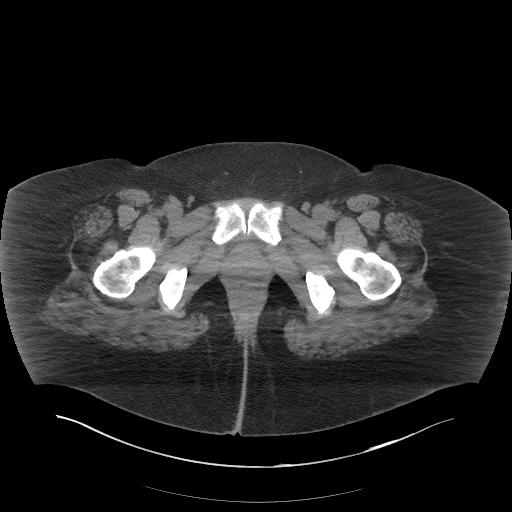
[im 23/105  soft-tissue]
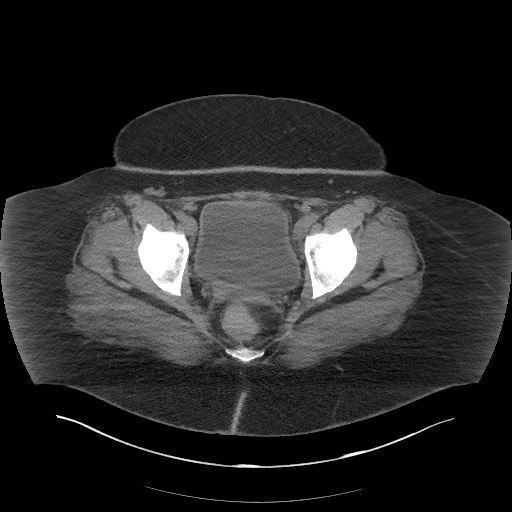
[im 28/105  soft-tissue]
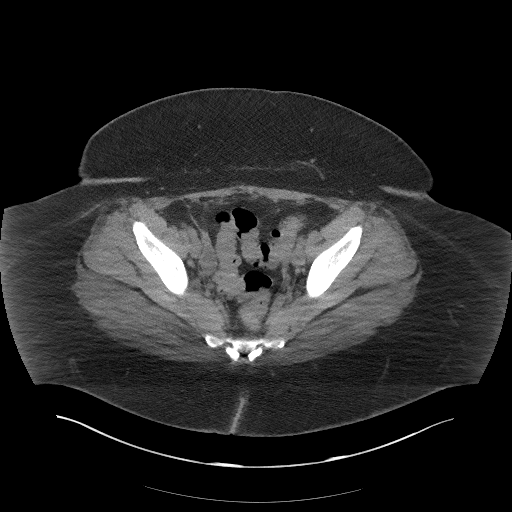
[im 37/105  soft-tissue]
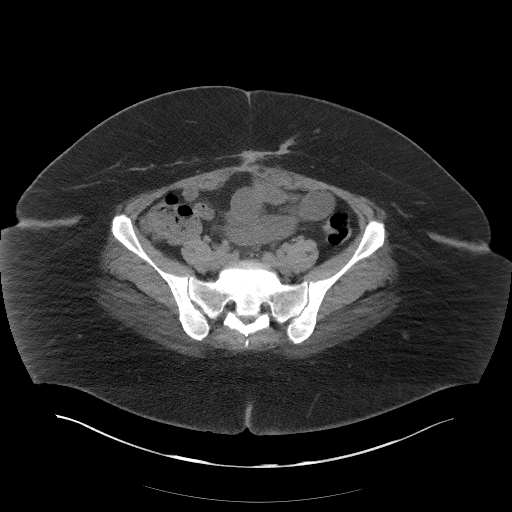
[im 46/105  soft-tissue]
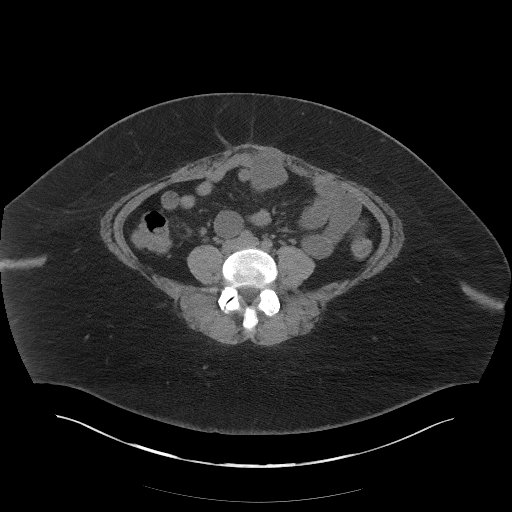
[im 55/105  soft-tissue]
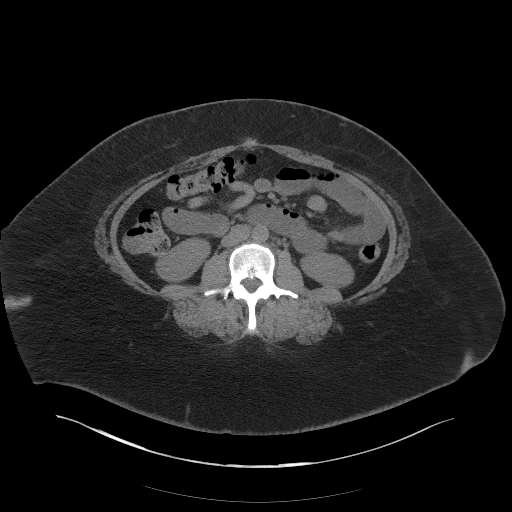
[im 59/105  soft-tissue]
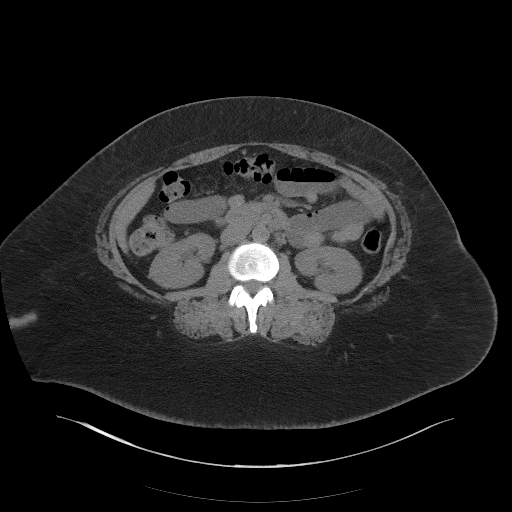
[im 68/105  soft-tissue]
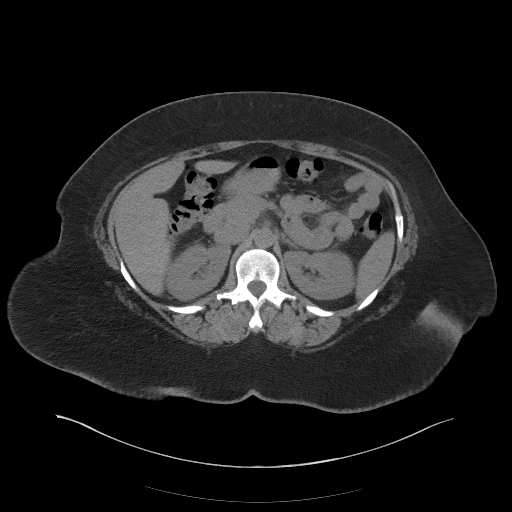
[im 68/105  bone]
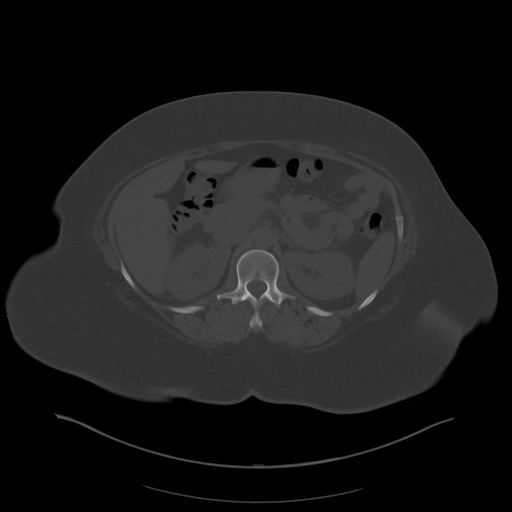
[im 77/105  soft-tissue]
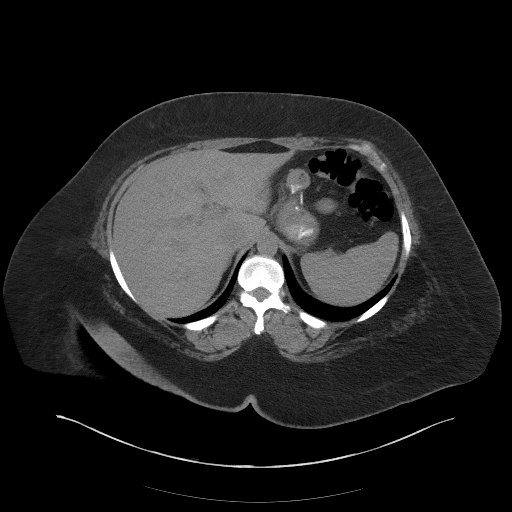
[im 82/105  soft-tissue]
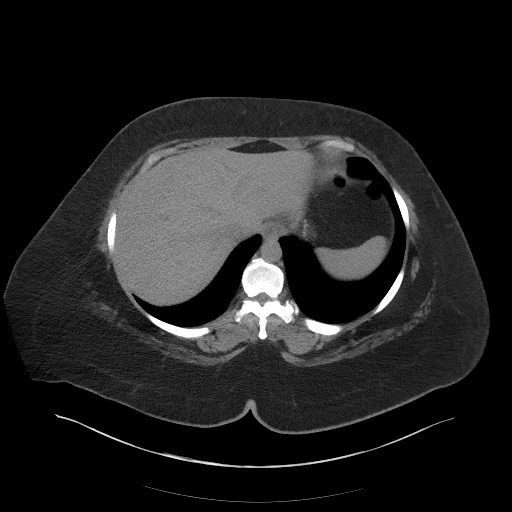
[im 91/105  soft-tissue]
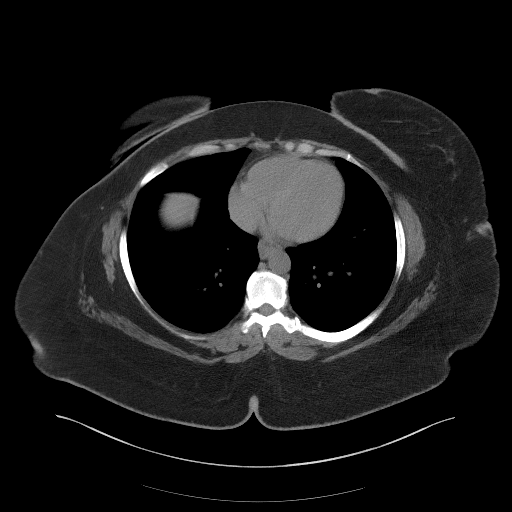
[im 100/105  soft-tissue]
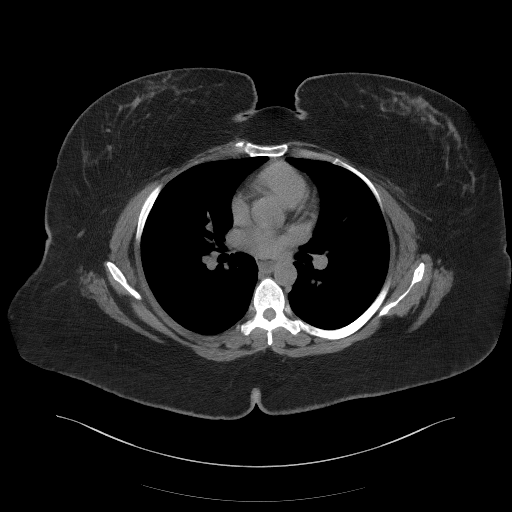

[Series 5: coronal st · coronal · 0.88mm/px · 3 of 83 slices shown]
[im 28/83  soft-tissue]
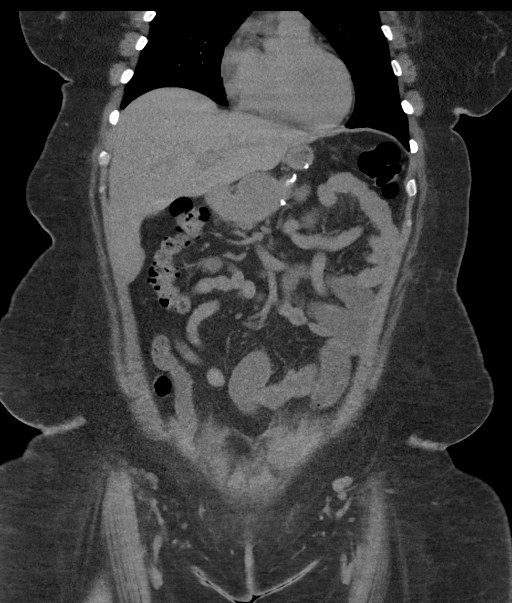
[im 37/83  soft-tissue]
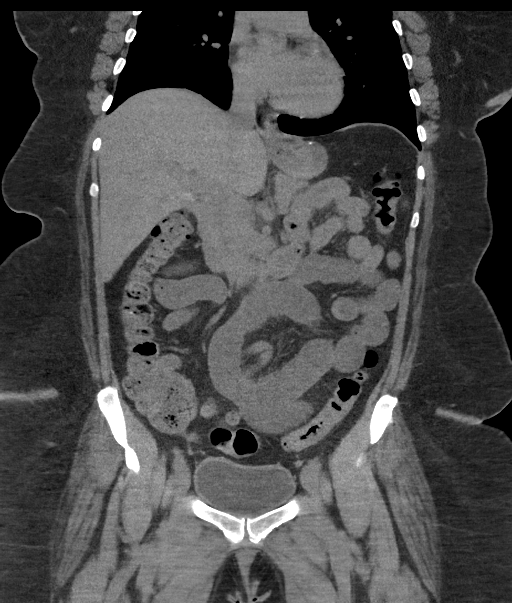
[im 46/83  soft-tissue]
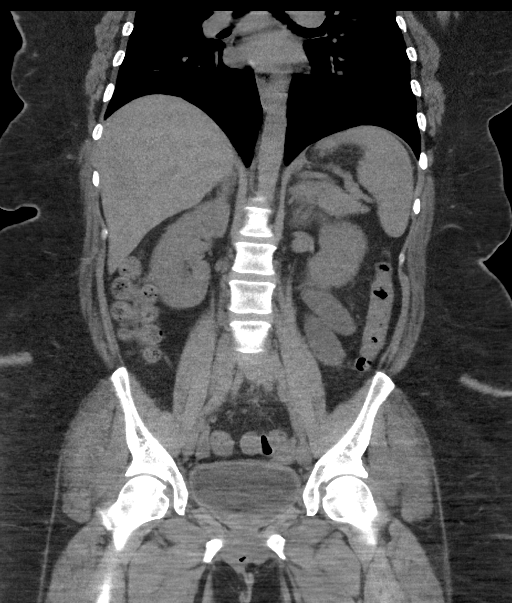

[16 of 46 positions shown; findings below may reference images not displayed]

FINDINGS: Lower chest: The lung bases are clear.

Hepatobiliary: No focal liver abnormality is seen. Status post
cholecystectomy. No biliary dilatation.

Pancreas: Unremarkable. No pancreatic ductal dilatation or
surrounding inflammatory changes.

Spleen: Normal in size without focal abnormality.

Adrenals/Urinary Tract: Adrenal glands are unremarkable. Kidneys are
normal, without renal calculi, focal lesion, or hydronephrosis.
Bladder is unremarkable.

Stomach/Bowel: Postoperative changes consistent with gastric sleeve.
No gastric distention. Proximal small bowel are fluid-filled and
mildly dilated with diameter measuring up to about 2.7 cm. Distal
small bowel and terminal ileum are decompressed. Changes are
nonspecific and could represent normal peristalsis or mild ileus.
Early small bowel obstruction is not excluded and follow-up is
recommended as clinically indicated. Colon is decompressed with
scattered stool. Appendix is normal.

Vascular/Lymphatic: No significant vascular findings are present. No
enlarged abdominal or pelvic lymph nodes.

Reproductive: Status post hysterectomy. No adnexal masses.

Other: Scarring in the anterior abdominal wall is likely
postoperative. No hernias. No free air or free fluid in the abdomen.

Musculoskeletal: No acute or significant osseous findings.
IMPRESSION: 1. Mildly dilated fluid-filled small bowel with decompressed
terminal ileum. Appearance is nonspecific but could indicate ileus
or early obstruction. Follow-up as clinically indicated.
2. Postoperative changes consistent with gastric sleeve.
3. Postoperative changes in the anterior abdominal wall.

## 2020-09-05 ENCOUNTER — Emergency Department (HOSPITAL_BASED_OUTPATIENT_CLINIC_OR_DEPARTMENT_OTHER)
Admission: EM | Admit: 2020-09-05 | Discharge: 2020-09-06 | Disposition: A | Discharge status: Home / Self Care | Payer: Self-pay | Attending: Emergency Medicine | Admitting: Emergency Medicine

## 2020-09-05 ENCOUNTER — Encounter (HOSPITAL_BASED_OUTPATIENT_CLINIC_OR_DEPARTMENT_OTHER): Payer: Self-pay | Admitting: *Deleted

## 2020-09-05 ENCOUNTER — Other Ambulatory Visit: Payer: Self-pay

## 2020-09-05 DIAGNOSIS — R4182 Altered mental status, unspecified: Secondary | ICD-10-CM | POA: Insufficient documentation

## 2020-09-05 DIAGNOSIS — Y900 Blood alcohol level of less than 20 mg/100 ml: Secondary | ICD-10-CM | POA: Insufficient documentation

## 2020-09-05 DIAGNOSIS — F12921 Cannabis use, unspecified with intoxication delirium: Secondary | ICD-10-CM | POA: Insufficient documentation

## 2020-09-05 DIAGNOSIS — Z8509 Personal history of malignant neoplasm of other digestive organs: Secondary | ICD-10-CM | POA: Insufficient documentation

## 2020-09-05 DIAGNOSIS — R0602 Shortness of breath: Secondary | ICD-10-CM | POA: Insufficient documentation

## 2020-09-05 NOTE — ED Provider Notes (Signed)
[place] DEPT [facility] Provider Note: Georgena Spurling, MD, FACEP  CSN: [id number] MRN: [id number] ARRIVAL: 09/05/20 at [place]: [id number]   CHIEF COMPLAINT  Shortness of Breath  Level 5 caveat: Altered mental status HISTORY OF PRESENT ILLNESS  09/05/20 11:54 PM Stacy Hill is a 49 y.o. female who drank a bottle of wine after 6 PM this evening.  She also raised some rice crispy "edibles" as well.  She subsequently started feeling short of breath and dizzy.  She called EMS for shortness of breath.  They found her to be in no distress with clear lungs and 100% saturation on room air.  She is not having any chest pain.  She is now somnolent though arousable.  She is able to deny shortness of breath at the present time.  CBG 175.  Past Medical History:  Diagnosis Date  . Anemia   . Anxiety    Last visit to [facility] 2008 gave birth to stillborn, pt teary today discussing  . Cancer ([place])   . Depression   . Fibroid   . 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 GERD (gastroesophageal reflux disease)    Takes Tums prn.  . Headache(784.0)   . Nausea and vomiting 09/15/2014  . Restless leg syndrome   . Seasonal allergies   . Shortness of breath    On exhertion,recently,with anemia per pt.  . Stromal tumor of digestive system [facility])     Past Surgical History:  Procedure Laterality Date  . ABDOMINAL HYSTERECTOMY N/A 02/13/2013   Procedure: HYSTERECTOMY ABDOMINAL WITH BILATERAL SALPINGECTOMY WITH EXTENSIVE LYSIS OF ADHESIONS;  Surgeon: Princess Bruins, MD;  Location: [facility] [facility];  Service: Gynecology;  Laterality: N/A;  . CESAREAN SECTION    . CHOLECYSTECTOMY    . GASTRECTOMY     partial  . LAPAROSCOPIC GASTRIC SLEEVE RESECTION      Family History  Problem Relation Age of Onset  . Cancer Mother        Bladder cancer  . Hypertension Mother        Living, 51  . COPD Mother   . Other Father        Died, 80s  . Hypertension Sister   . Heart attack Maternal Grandfather      Social History   Tobacco Use  . Smoking status: Never Smoker  . Smokeless tobacco: Never Used  Substance Use Topics  . Alcohol use: Yes    Alcohol/week: 0.0 standard drinks    Comment: occasional   . Drug use: No    Comment: thc edibles     Prior to Admission medications   Medication Sig Start Date End Date Taking? Authorizing Provider  omeprazole (PRILOSEC) 20 MG capsule Take 20 mg by mouth daily.  03/04/14 09/06/20  [provider]  prochlorperazine (COMPAZINE) 10 MG tablet Take 1 tablet by mouth every 6 (six) hours as needed for nausea or vomiting.  12/26/13 09/06/20  [provider]    Allergies Chocolate flavor, Ivp dye [iodinated diagnostic agents], Shellfish allergy, and Strawberry extract   REVIEW OF SYSTEMS  Cannot assess, altered mental status.   PHYSICAL EXAMINATION  Initial Vital Signs Blood pressure 115/79, pulse 90, temperature 97.6 F (36.4 C), temperature source Oral, resp. rate 14, height 5\' 4"  (1.626 m), weight 135.7 kg, last menstrual period 01/10/2013, SpO2 100 %.  Examination General: Well-developed, well-nourished female in no acute distress; appearance consistent with age of record HENT: normocephalic; atraumatic Eyes: Cosmetic contact lenses Neck: supple Heart: regular rate and  rhythm Lungs: clear to auscultation bilaterally Abdomen: soft; nondistended; nontender; bowel sounds present Extremities: No deformity; full range of motion; pulses normal Neurologic: Somnolent but briefly arousable and can answer simple questions; noted to move all extremities Skin: Warm and dry   RESULTS  Summary of this visit's results, reviewed and interpreted by myself:   EKG Interpretation  Date/Time:    Ventricular Rate:    PR Interval:    QRS Duration:   QT Interval:    QTC Calculation:   R Axis:     Text Interpretation:        Laboratory Studies: Results for orders placed or performed during the hospital encounter of 09/05/20  (from the past 24 hour(s))  Ethanol     Status: None   Collection Time: 09/05/20 11:19 PM  Result Value Ref Range   Alcohol, Ethyl (B) <10 <10 mg/dL  Rapid urine drug screen (hospital performed)     Status: Abnormal   Collection Time: 09/06/20  1:54 AM  Result Value Ref Range   Opiates NONE DETECTED NONE DETECTED   Cocaine NONE DETECTED NONE DETECTED   Benzodiazepines NONE DETECTED NONE DETECTED   Amphetamines NONE DETECTED NONE DETECTED   Tetrahydrocannabinol POSITIVE (A) NONE DETECTED   Barbiturates NONE DETECTED NONE DETECTED   Imaging Studies: No results found.  ED COURSE and MDM  Nursing notes, initial and subsequent vitals signs, including pulse oximetry, reviewed and interpreted by myself.  Vitals:   09/06/20 0100 09/06/20 0130 09/06/20 0205 09/06/20 0230  BP: 107/72 104/82 90/79 101/68  Pulse: 83 95 90 85  Resp: 12 13 13 12   Temp:      TempSrc:      SpO2: 100% 100% 100% 100%  Weight:      Height:       Medications - No data to display  2:31 AM Patient awake, alert and feeling better.  She is positive for cannabis but negative for alcohol.   PROCEDURES  Procedures   ED DIAGNOSES     ICD-10-CM   1. Cannabis intoxication with delirium ([place])  Z61.096        Shanon Rosser, MD 09/06/20 [phone]

## 2020-09-05 NOTE — ED Triage Notes (Addendum)
Pt arrived via [facility] with c/o of feeling sob. States she drank a bottle of wine after 6pm. States she also ate rice krispy edibles after 6pm as well. States she started feeling sob and dizzy. Pt is sleepy on arrival, but able to answer questions. O2 sats 100% on RA. Denies any chest pain. EMS reports a CBG of 175.

## 2020-09-05 NOTE — Progress Notes (Signed)
RT able to obtain blood return from attempted IV insertion but was unable to advance the catheter.

## 2020-09-06 LAB — RAPID URINE DRUG SCREEN, HOSP PERFORMED
Amphetamines: NOT DETECTED
Barbiturates: NOT DETECTED
Benzodiazepines: NOT DETECTED
Cocaine: NOT DETECTED
Opiates: NOT DETECTED
Tetrahydrocannabinol: POSITIVE — AB

## 2020-09-06 LAB — ETHANOL: Alcohol, Ethyl (B): <10 mg/dL (ref <10)

## 2020-09-06 NOTE — ED Notes (Signed)
Pt endorses feeling much better than when she arrived.

## 2021-12-22 ENCOUNTER — Other Ambulatory Visit: Payer: Self-pay

## 2021-12-22 ENCOUNTER — Emergency Department (HOSPITAL_BASED_OUTPATIENT_CLINIC_OR_DEPARTMENT_OTHER): Payer: BC Managed Care – PPO

## 2021-12-22 ENCOUNTER — Encounter (HOSPITAL_BASED_OUTPATIENT_CLINIC_OR_DEPARTMENT_OTHER): Payer: Self-pay

## 2021-12-22 ENCOUNTER — Emergency Department (HOSPITAL_BASED_OUTPATIENT_CLINIC_OR_DEPARTMENT_OTHER)
Admission: EM | Admit: 2021-12-22 | Discharge: 2021-12-22 | Disposition: A | Discharge status: Home / Self Care | Payer: BC Managed Care – PPO | Attending: Emergency Medicine | Admitting: Emergency Medicine

## 2021-12-22 DIAGNOSIS — M79672 Pain in left foot: Secondary | ICD-10-CM | POA: Diagnosis present

## 2021-12-22 MED ORDER — NAPROXEN 500 MG PO TABS: 500.0000 mg | ORAL_TABLET | Freq: Two times a day (BID) | ORAL | 0 refills | Status: DC

## 2021-12-22 MED ORDER — NAPROXEN 250 MG PO TABS
500.0000 mg | ORAL_TABLET | Freq: Once | ORAL | Status: AC
  Administered 2021-12-22: 500 mg via ORAL
  Filled 2021-12-22: qty 2

## 2021-12-22 NOTE — Discharge Instructions (Addendum)
A prescription for naproxen, an anti-inflammatory, has been sent to your [facility].  Please take this 1-2 times per day as indicated for pain relief.  Rehabilitation and stretching information has been provided in your discharge paperwork for you to read and practice. ? ?Please follow-up with your regular orthopedic specialist from [place] group as discussed in the next 3 to 5 days. ? ?Return to the ED for new or worsening symptoms as discussed. ?

## 2021-12-22 NOTE — ED Triage Notes (Signed)
Pt c/o left foot pain/ "a knot at the bottom" that started last night-denies injury-to triage in w/c-NAD ?

## 2021-12-22 NOTE — ED Provider Notes (Cosign Needed)
[facility] DEPARTMENT Provider Note   CSN: [id number] Arrival date & time: 12/22/21  1345     History  Chief Complaint  Patient presents with   Foot Pain    Stacy Hill is a 51 y.o. female presenting today for left foot pain that woke her up this morning.  Patient has been very active walking [place] and [place] in the [place] over the past few days and today.  History of bone spurs on the bottom of her right foot.  Pain of the left foot today is on the sole of the foot just distal to the heel.  Denies recent trauma to the area.  Patient states pressure makes it worse and resting makes it better.  Patient has not tried taking anything to help relieve the pain.  Denies numbness or tingling, or edema of the affected foot.  Currently wears orthotics and sees an orthopedic office with [facility].  The history is provided by the patient and medical records.  Foot Pain     Home Medications Prior to Admission medications   Medication Sig Start Date End Date Taking? Authorizing Provider  naproxen (NAPROSYN) 500 MG tablet Take 1 tablet (500 mg total) by mouth 2 (two) times daily. 12/22/21  Yes Prince Rome, PA-C  omeprazole (PRILOSEC) 20 MG capsule Take 20 mg by mouth daily.  03/04/14 09/06/20  [provider]  prochlorperazine (COMPAZINE) 10 MG tablet Take 1 tablet by mouth every 6 (six) hours as needed for nausea or vomiting.  12/26/13 09/06/20  [provider]      Allergies    Chocolate flavor, Ivp dye [iodinated contrast media], Shellfish allergy, and Strawberry extract    Review of Systems   Review of Systems  Musculoskeletal:        Left foot pain   Physical Exam Updated Vital Signs BP (!) 118/97 (BP Location: Left Arm)    Pulse 87    Temp 98.5 F (36.9 C) (Oral)    Resp 18    Ht '5\' 2"'$  (1.575 m)    Wt (!) 152.4 kg    LMP 01/10/2013    SpO2 100%    BMI 61.46 kg/m  Physical Exam Vitals and nursing note reviewed.   Constitutional:      General: She is not in acute distress.    Appearance: She is well-developed.  HENT:     Head: Normocephalic and atraumatic.  Eyes:     Conjunctiva/sclera: Conjunctivae normal.  Cardiovascular:     Rate and Rhythm: Normal rate and regular rhythm.  Pulmonary:     Effort: Pulmonary effort is normal. No respiratory distress.  Abdominal:     Palpations: Abdomen is soft.  Musculoskeletal:        General: No swelling.     Cervical back: Neck supple.     Right foot: Normal range of motion. No foot drop.     Left foot: Normal range of motion. No foot drop.       Feet:  Feet:     Right foot:     Skin integrity: Skin integrity normal.     Left foot:     Skin integrity: Skin integrity normal.     Comments: Tenderness only indicated as above DP 2+ bilaterally No observed erythema, edema, ecchymosis, or other skin changes Skin:    General: Skin is warm and dry.     Capillary Refill: Capillary refill takes less than 2 seconds.  Neurological:  Mental Status: She is alert and oriented to person, place, and time.  Psychiatric:        Mood and Affect: Mood normal.    ED Results / Procedures / Treatments   Labs (all labs ordered are listed, but only abnormal results are displayed) Labs Reviewed - No data to display  EKG None  Radiology DG Foot Complete Left  Result Date: 12/22/2021 CLINICAL DATA:  Heel pain EXAM: LEFT FOOT - COMPLETE 3+ VIEW COMPARISON:  None. FINDINGS: There is no evidence of fracture or dislocation. There is no evidence of arthropathy or other focal bone abnormality. Soft tissues are unremarkable. Mild calcaneal spurring IMPRESSION: Mild calcaneal spurring at the Achilles tendon and plantar fascia insertion. No acute skeletal abnormality. Electronically Signed   By: Franchot Gallo M.D.   On: 12/22/2021 15:40    Procedures Procedures    Medications Ordered in ED Medications  naproxen (NAPROSYN) tablet 500 mg (500 mg Oral Given 12/22/21  1610)    ED Course/ Medical Decision Making/ A&P                           Medical Decision Making Amount and/or Complexity of Data Reviewed External Data Reviewed: notes. Radiology: ordered and independent interpretation performed. Decision-making details documented in ED Course.  Risk Prescription drug management.   51 y.o. female presents to the ED for concern of Foot Pain.  This involves an extensive number of treatment options, and is a complaint that carries with it a high risk of complications and morbidity.  The differential diagnosis includes plantar fasciitis, bunion, lipoma, fracture  Comorbidities that complicate the patient evaluation include class III obesity, anemia, anxiety  Additional history obtained from internal/external records available via epic  Interpretation: I ordered imaging studies including x-ray of the left foot.  I independently visualized and interpreted imaging which showed no evidence of fracture, but did indicate mild spurring of the calcaneus at both the Achilles tendon and plantar fascia insertion sites.  I agree with the radiologist interpretation  Intervention: I ordered medication including naproxen for purpose of anti-inflammatory/pain relief.  Reevaluation of the patient after these medicines showed that the patient had mild improvement.  I have reviewed the patients home medicines and have made adjustments as needed  ED Course: Patient ambulatory into the ED.  Previous history of plantar fasciitis on the right foot.  Patient has been very active on her feet over the last 2 days.  BMI of 61.4.  Physical exam strongly supportive of plantar fasciitis.  This was confirmed with imaging.  Bunion or lipoma not noted on physical exam not supported by history.  Considered fracture, but imaging and physical exam not supportive of this.  Lower extremities neurovascularly intact.  No restriction to range of motion.  Recommended follow-up with orthopedics and  primary care for continued medical management/reevaluation.  Provided anti-inflammatory medication in the ED and provided prescription for more the anti-inflammatory to her pharmacy.  Patient may take this until she is able to follow-up with orthopedics as discussed.  Already has a night splint at home for treatment of the other foot this Plantar fasciitis.  Patient states she will use this tonight and does not need another night splint.  Patient already has orthopedic specialist and states she will follow-up with them.  Recommended use of frozen water bottle or tennis ball to roll her foot over to help stretch out and reduce the pain in that foot twice per day.  She already knows how to use the night splint.  Patient has no further questions  Disposition: I discussed the patient and their case with my attending, Dr. Almyra Free, who agreed with the proposed treatment course.  After consideration of the diagnostic results and the patient's response to treatment, I feel that the patent would benefit from outpatient follow-up with regular orthopedics and primary care.  Discussed course of treatment thoroughly with the patient and she demonstrated understanding.  Patient in agreement and has no further questions.        Final Clinical Impression(s) / ED Diagnoses Final diagnoses:  Foot pain, left    Rx / DC Orders ED Discharge Orders          Ordered    naproxen (NAPROSYN) 500 MG tablet  2 times daily        12/22/21 1603              Prince Rome, [place] [birth year] [phone]

## 2022-03-29 ENCOUNTER — Encounter: Payer: Self-pay | Admitting: Gastroenterology

## 2022-04-12 ENCOUNTER — Ambulatory Visit: Payer: BC Managed Care – PPO | Admitting: Gastroenterology

## 2022-04-12 ENCOUNTER — Encounter: Payer: Self-pay | Admitting: Gastroenterology

## 2022-04-12 VITALS — BP 130/70 | HR 90 | Ht 64.0 in | Wt 332.0 lb

## 2022-04-12 DIAGNOSIS — Z8509 Personal history of malignant neoplasm of other digestive organs: Secondary | ICD-10-CM | POA: Diagnosis not present

## 2022-04-12 DIAGNOSIS — Z1211 Encounter for screening for malignant neoplasm of colon: Secondary | ICD-10-CM

## 2022-04-12 DIAGNOSIS — K219 Gastro-esophageal reflux disease without esophagitis: Secondary | ICD-10-CM | POA: Diagnosis not present

## 2022-04-12 DIAGNOSIS — K259 Gastric ulcer, unspecified as acute or chronic, without hemorrhage or perforation: Secondary | ICD-10-CM

## 2022-04-12 MED ORDER — OMEPRAZOLE 40 MG PO CPDR: 40.0000 mg | DELAYED_RELEASE_CAPSULE | Freq: Two times a day (BID) | ORAL | 3 refills | Status: DC

## 2022-04-12 MED ORDER — SUCRALFATE 1 GM/10ML PO SUSP: 1.0000 g | Freq: Four times a day (QID) | ORAL | 1 refills | Status: DC

## 2022-04-12 NOTE — Progress Notes (Signed)
Chief Complaint: GI complaints  Referring Provider:  Coralee Rud, PA-C      ASSESSMENT AND PLAN;   #1. Refractory GERD. H/O gastric GIST, s/p lap gastric sleeve as below  #2. IBS with alt diarrhea/constipation  #3. Sis/mom with polyps. Pt due for screening colonoscopy.  Plan: -EGD/colon at [facility] d/t BMI>50.  -Has appt with Dr Waymon Amato [facility] onc). She will get CT AP at [facility] after IV contrast desensitization -Omeprazole 40mg  po BID -Carafate elixir 1g QID x 2 weeks. 2RF -Hold phentermine 1 week before EGD/colon.    I discussed EGD/Colonoscopy- the indications, risks, alternatives and potential complications including, but not limited to bleeding, infection, reaction to meds, damage to internal organs, cardiac and/or pulmonary problems, and perforation requiring surgery. The possibility that significant findings could be missed was explained. All ? were answered. Pt consents to proceed. HPI:    Stacy Hill is a 51 y.o. female  With gastric GIST s/p lap resection 2015, lap gastric sleeve 03/2017 (lost 405 to 210, then covid related wt gain), OSA, GERD, anxiety/depression, obesity  With worsening GERD without dysphagia or odynophagia. C/O heartburn, nausea, waterbrash most prominently since gastric sleeve surgery. No vomiting. Lately has been having epi pain especially after eating Symptoms despite omeprazole 40 BID Has tried Protonix in past without any significant benefit. Had EGD 4 to 5 years ago at [facility] not recall the results.  Reports not available.   Also has longstanding alternating constipation with diarrhea. Dx with IBS.  No melena or hematochezia.  Has associated Abdo bloating.  No unintentional weight loss.  Never had colonoscopy.    No jaundice dark urine or pale stools.  No sodas, chocolates, chewing gums, artificial sweeteners and candy. No NSAIDs.  Has made appt with Dr. Waymon Amato (oncology) at [facility].  She will get CT done at  [facility].   Review of previous records regarding Gastric GIST: ([facility]) A. Hysterectomy on 02/13/13 with evaluation of postoperative seroma by CT on 03/13/13 incidentally revealing a 7.7 x 5.4 x 4.8 cm mass immediately adjacent to greater curvature of stomach B. 04/10/13 FNA of stomach mass with pathology revealing gastrointestinal stromal tumor, CD117+ C. Evaluated by Dr. Donalynn Furlong at [facility] with recommendation of neoadjuvant imatinib to potentially shrink tumor to allow a laparoscopic surgery. D. 04/25/13 medical oncologist Dr. Victoriano Lain recommended neoadjuvant imatinib  E. Initiated imatinib 400 mg daily 05/02/2013, with plan to reimage in 3 months F. 07/03/13 evaluated as a new patient by Dr. Netta Corrigan. 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 CT abd/pelvis 07/25/13 reveals decrease in size of gastric mass I. CT abd/pelv from 10/30/13 revealing stable disease J. Status post laparoscopic partial gastrectomy 11/15/13 with pathology revealing 4.0 x 2.6 cm tumor, mitotic rate 1/50 hpf, negative margins K. 12/26/13 initiated adjuvant imatinib 400mg  once daily L. Serial imaging surveillance recommended M. 03/24/14: self d/ced Gleevec due to increased fatigue N. 04/02/14: CT A/P: interval resection of a stomach greater curvature mass and no change in retroperitoneal lymphadenopathy O. Attempted re-initiation of imatinib at 200 mg/day with titration to 400 mg daily P. Serial surveillance revealing no evidence of locally recurrent or metastatic disease. Imatinib discontinued ~ 07/2017 d/t non-compliance.  Past GI work-up: CT Abdo/pelvis with IV contrast at [facility] (after contrast desensitization) 11/2018. FU GIST -No evidence of metastatic disease within the abdomen or pelvis.   CT AP without contrast 07/2017 1. Mildly dilated fluid-filled small bowel with decompressed terminal ileum. Appearance is nonspecific but could indicate ileus or  early obstruction. Follow-up as clinically indicated. 2. Postoperative changes  consistent with gastric sleeve. 3. Postoperative changes in the anterior abdominal wall.  Past Medical History:  Diagnosis Date   Anemia    Anxiety    Last visit to [facility] 2008 gave birth to stillborn, pt teary today discussing   Bowel obstruction (HCC)    Cancer (HCC)    Depression    Fibroid    Gastrointestinal stromal tumor (GIST) (HCC)    GERD (gastroesophageal reflux disease)    Takes Tums prn.   Headache(784.0)    IBS (irritable bowel syndrome)    Nausea and vomiting 09/15/2014   Obesity    Restless leg syndrome    Seasonal allergies    Shortness of breath    On exhertion,recently,with anemia per pt.   Sleep apnea    Stromal tumor of digestive system [facility])     Past Surgical History:  Procedure Laterality Date   ABDOMINAL HYSTERECTOMY N/A 02/13/2013   Procedure: HYSTERECTOMY ABDOMINAL WITH BILATERAL SALPINGECTOMY WITH EXTENSIVE LYSIS OF ADHESIONS;  Surgeon: Genia Del, MD;  Location: [facility] OR[facility];  Service: Gynecology;  Laterality: N/A;   CESAREAN SECTION     CHOLECYSTECTOMY     GASTRECTOMY     partial   LAPAROSCOPIC GASTRIC SLEEVE RESECTION      Family History  Problem Relation Age of Onset   Cancer Mother        Bladder cancer   Hypertension Mother        Living, 80   COPD Mother    Esophageal cancer Mother    Stomach cancer Mother    Other Father        Died, 53s   Hypertension Sister    Irritable bowel syndrome Maternal Grandmother    Heart attack Maternal Grandfather    Irritable bowel syndrome Maternal Aunt        x multiple aunt's   Miscarriages / [place] Son     Social History   Tobacco Use   Smoking status: Never   Smokeless tobacco: Never  Vaping Use   Vaping Use: Never used  Substance Use Topics   Alcohol use: Yes    Alcohol/week: 0.0 standard drinks of alcohol    Comment: occasional    Drug use: Not Currently    Types: Marijuana    Current Outpatient Medications  Medication Sig Dispense Refill   diphenhydrAMINE (BENADRYL) 25  MG tablet Take 100 mg by mouth at bedtime as needed.     hydrOXYzine (ATARAX) 50 MG tablet Take 100 mg by mouth at bedtime.     omeprazole (PRILOSEC) 40 MG capsule Take 80 mg by mouth at bedtime.     ondansetron (ZOFRAN-ODT) 4 MG disintegrating tablet One as needed     phentermine 37.5 MG capsule Take 1 capsule by mouth daily.     No current facility-administered medications for this visit.    Allergies  Allergen Reactions   Chocolate Flavor Hives   Ivp Dye [Iodinated Contrast Media] Hives   Shellfish Allergy Hives   Strawberry Extract Hives    Review of Systems:  Constitutional: Denies fever, chills, diaphoresis, appetite change and has fatigue.  HEENT: Denies photophobia, eye pain, redness, hearing loss, ear pain, congestion, sore throat, rhinorrhea, sneezing, mouth sores, neck pain, neck stiffness and tinnitus.   Respiratory: Denies SOB, DOE, cough, chest tightness,  and wheezing.   Cardiovascular: Denies chest pain, palpitations and leg swelling.  Genitourinary: Denies dysuria, urgency, frequency, hematuria, flank pain and difficulty urinating.  Musculoskeletal:  Denies myalgias, has back pain, joint swelling, arthralgias and gait problem.  Skin: No rash.  Neurological: Denies dizziness, seizures, syncope, weakness, light-headedness, numbness and headaches.  Hematological: Denies adenopathy. Easy bruising, personal or family bleeding history  Psychiatric/Behavioral: Has anxiety or depression     Physical Exam:    Pulse 90   Ht 5\' 4"  (1.626 m)   Wt (!) 332 lb (150.6 kg)   LMP 01/10/2013   BMI 56.99 kg/m  Wt Readings from Last 3 Encounters:  04/12/22 (!) 332 lb (150.6 kg)  12/22/21 (!) 336 lb (152.4 kg)  09/05/20 299 lb 3.2 oz (135.7 kg)   Constitutional:  Well-developed, in no acute distress. Psychiatric: Normal mood and affect. Behavior is normal. HEENT: Pupils normal.  Conjunctivae are normal. No scleral icterus. Neck supple.  Cardiovascular: Normal rate, regular  rhythm. No edema Pulmonary/chest: Effort normal and breath sounds normal. No wheezing, rales or rhonchi. Abdominal: Soft, nondistended. Epi tender. Bowel sounds active throughout. There are no masses palpable. No hepatomegaly.  Well-healed surgical scars. Rectal: Deferred Neurological: Alert and oriented to person place and time. Skin: Skin is warm and dry. No rashes noted.    Edman Circle, MD 04/12/2022, 3:38 PM  Cc: Coralee Rud, PA-C

## 2022-04-13 ENCOUNTER — Telehealth: Payer: Self-pay | Admitting: Gastroenterology

## 2022-04-13 NOTE — Telephone Encounter (Signed)
Inbound call from patient stating that her insurance has denied CARAFATE, and they told her she could have it sent to [place]  if it was in pill form. Patient is requesting a new prescription be resent.  Patient also stated that she is supposed to be on a wait list to have a colonoscopy at [facility]. Patient is seeking advice if there is anyway she can have procedure done at a different hospital due to her mom just passing away there.  Please advise.

## 2022-04-14 NOTE — Telephone Encounter (Signed)
Dr.Gupta, do you approve the medication change form a slurry to a tablet. This patient is on your [facility] endo hospital wait list, she is requesting a different setting for her scope. Please advise.

## 2022-04-15 MED ORDER — SUCRALFATE 1 G PO TABS: 1.0000 g | ORAL_TABLET | Freq: Three times a day (TID) | ORAL | 6 refills | Status: DC

## 2022-04-15 NOTE — Telephone Encounter (Addendum)
Pt aware of new Rx changes and explained to to make a slurry. We discussed the hospital case and the reasoning for the hospital setting.

## 2022-04-15 NOTE — Telephone Encounter (Signed)
Please do Can call in tablets RG

## 2022-04-21 ENCOUNTER — Telehealth: Payer: Self-pay

## 2022-04-21 NOTE — Telephone Encounter (Signed)
LVM for pt to call back.   Looks like we have a [facility] date of September 28th if she would like to take it.

## 2022-04-25 NOTE — Telephone Encounter (Signed)
Pt scheduled 9-28 at [facility] with Dr Lyndel Safe Instructions mailed

## 2022-04-25 NOTE — Addendum Note (Signed)
Addended by: Curlene Labrum E on: 04/25/2022 01:59 PM   Modules accepted: Orders

## 2022-04-25 NOTE — Telephone Encounter (Signed)
Called pt and left a voicemail.

## 2022-04-26 NOTE — Telephone Encounter (Signed)
Patient said she will view instructions on mychart and call with any questions or concerns.

## 2022-07-07 ENCOUNTER — Encounter (HOSPITAL_COMMUNITY): Payer: Self-pay | Admitting: Gastroenterology

## 2022-07-07 NOTE — Progress Notes (Signed)
Attempted to obtain medical history via telephone, unable to reach at this time. HIPAA compliant voicemail message left requesting return call to pre surgical testing department. 

## 2022-07-14 ENCOUNTER — Encounter (HOSPITAL_COMMUNITY): Payer: Self-pay | Admitting: Gastroenterology

## 2022-07-14 ENCOUNTER — Ambulatory Visit (HOSPITAL_COMMUNITY): Payer: BC Managed Care – PPO | Admitting: Anesthesiology

## 2022-07-14 ENCOUNTER — Encounter (HOSPITAL_COMMUNITY)
Admission: RE | Disposition: A | Discharge status: Home / Self Care | Payer: Self-pay | Source: Home / Self Care | Attending: Gastroenterology

## 2022-07-14 ENCOUNTER — Other Ambulatory Visit: Payer: Self-pay

## 2022-07-14 ENCOUNTER — Ambulatory Visit (HOSPITAL_COMMUNITY)
Admission: RE | Admit: 2022-07-14 | Discharge: 2022-07-14 | Disposition: A | Discharge status: Home / Self Care | Payer: BC Managed Care – PPO | Attending: Gastroenterology | Admitting: Gastroenterology

## 2022-07-14 DIAGNOSIS — K648 Other hemorrhoids: Secondary | ICD-10-CM | POA: Insufficient documentation

## 2022-07-14 DIAGNOSIS — Z1211 Encounter for screening for malignant neoplasm of colon: Secondary | ICD-10-CM | POA: Diagnosis not present

## 2022-07-14 DIAGNOSIS — K319 Disease of stomach and duodenum, unspecified: Secondary | ICD-10-CM | POA: Diagnosis not present

## 2022-07-14 DIAGNOSIS — Z8371 Family history of colonic polyps: Secondary | ICD-10-CM | POA: Diagnosis not present

## 2022-07-14 DIAGNOSIS — J45909 Unspecified asthma, uncomplicated: Secondary | ICD-10-CM | POA: Diagnosis not present

## 2022-07-14 DIAGNOSIS — Z8509 Personal history of malignant neoplasm of other digestive organs: Secondary | ICD-10-CM

## 2022-07-14 DIAGNOSIS — K5730 Diverticulosis of large intestine without perforation or abscess without bleeding: Secondary | ICD-10-CM | POA: Diagnosis not present

## 2022-07-14 DIAGNOSIS — K2100 Gastro-esophageal reflux disease with esophagitis, without bleeding: Secondary | ICD-10-CM | POA: Insufficient documentation

## 2022-07-14 DIAGNOSIS — Z83719 Family history of colon polyps, unspecified: Secondary | ICD-10-CM

## 2022-07-14 DIAGNOSIS — K2950 Unspecified chronic gastritis without bleeding: Secondary | ICD-10-CM | POA: Diagnosis not present

## 2022-07-14 DIAGNOSIS — K259 Gastric ulcer, unspecified as acute or chronic, without hemorrhage or perforation: Secondary | ICD-10-CM | POA: Diagnosis not present

## 2022-07-14 DIAGNOSIS — Z6841 Body Mass Index (BMI) 40.0 and over, adult: Secondary | ICD-10-CM | POA: Insufficient documentation

## 2022-07-14 DIAGNOSIS — G4730 Sleep apnea, unspecified: Secondary | ICD-10-CM | POA: Diagnosis not present

## 2022-07-14 DIAGNOSIS — Z9884 Bariatric surgery status: Secondary | ICD-10-CM | POA: Diagnosis not present

## 2022-07-14 DIAGNOSIS — K219 Gastro-esophageal reflux disease without esophagitis: Secondary | ICD-10-CM

## 2022-07-14 HISTORY — PX: BIOPSY: SHX5522

## 2022-07-14 HISTORY — PX: ESOPHAGOGASTRODUODENOSCOPY (EGD) WITH PROPOFOL: SHX5813

## 2022-07-14 HISTORY — DX: Unspecified asthma, uncomplicated: J45.909

## 2022-07-14 HISTORY — PX: COLONOSCOPY WITH PROPOFOL: SHX5780

## 2022-07-14 SURGERY — COLONOSCOPY WITH PROPOFOL
Anesthesia: Monitor Anesthesia Care

## 2022-07-14 MED ORDER — PROPOFOL 10 MG/ML IV BOLUS
INTRAVENOUS | Status: AC
  Filled 2022-07-14: qty 20

## 2022-07-14 MED ORDER — LIDOCAINE 2% (20 MG/ML) 5 ML SYRINGE
INTRAMUSCULAR | Status: DC | PRN
  Administered 2022-07-14: 60 mg via INTRAVENOUS

## 2022-07-14 MED ORDER — PROPOFOL 500 MG/50ML IV EMUL
INTRAVENOUS | Status: DC | PRN
  Administered 2022-07-14: 125 ug/kg/min via INTRAVENOUS

## 2022-07-14 MED ORDER — LACTATED RINGERS IV SOLN: INTRAVENOUS | Status: DC

## 2022-07-14 MED ORDER — LACTATED RINGERS IV SOLN
INTRAVENOUS | Status: DC
  Administered 2022-07-14: 1000 mL via INTRAVENOUS

## 2022-07-14 MED ORDER — PROPOFOL 10 MG/ML IV BOLUS
INTRAVENOUS | Status: DC | PRN
  Administered 2022-07-14: 30 mg via INTRAVENOUS
  Administered 2022-07-14: 40 mg via INTRAVENOUS
  Administered 2022-07-14: 30 mg via INTRAVENOUS

## 2022-07-14 MED ORDER — PROPOFOL 500 MG/50ML IV EMUL
INTRAVENOUS | Status: AC
  Filled 2022-07-14: qty 50

## 2022-07-14 MED ORDER — PANTOPRAZOLE SODIUM 40 MG PO TBEC: 40.0000 mg | DELAYED_RELEASE_TABLET | Freq: Two times a day (BID) | ORAL | 3 refills | Status: AC

## 2022-07-14 MED ORDER — SODIUM CHLORIDE 0.9 % IV SOLN: INTRAVENOUS | Status: DC

## 2022-07-14 SURGICAL SUPPLY — 25 items

## 2022-07-14 NOTE — Anesthesia Procedure Notes (Signed)
Procedure Name: MAC Date/Time: 07/14/2022 7:41 AM  Performed by: Lollie Sails, CRNAPre-anesthesia Checklist: Patient identified, Emergency Drugs available, Suction available, Patient being monitored and Timeout performed Oxygen Delivery Method: Nasal cannula Placement Confirmation: positive ETCO2

## 2022-07-14 NOTE — Anesthesia Preprocedure Evaluation (Signed)
Anesthesia Evaluation  Patient identified by MRN, date of birth, ID band Patient awake    Reviewed: Allergy & Precautions, NPO status , Patient's Chart, lab work & pertinent test results  Airway Mallampati: II  TM Distance: >3 FB Neck ROM: Full    Dental no notable dental hx.    Pulmonary asthma , sleep apnea ,    Pulmonary exam normal breath sounds clear to auscultation       Cardiovascular negative cardio ROS Normal cardiovascular exam Rhythm:Regular Rate:Normal     Neuro/Psych negative neurological ROS  negative psych ROS   GI/Hepatic Neg liver ROS, GERD  ,  Endo/Other  Morbid obesity  Renal/GU negative Renal ROS  negative genitourinary   Musculoskeletal negative musculoskeletal ROS (+)   Abdominal   Peds negative pediatric ROS (+)  Hematology negative hematology ROS (+)   Anesthesia Other Findings   Reproductive/Obstetrics negative OB ROS                             Anesthesia Physical Anesthesia Plan  ASA: 3  Anesthesia Plan: MAC   Post-op Pain Management: Minimal or no pain anticipated   Induction: Intravenous  PONV Risk Score and Plan: 2 and Propofol infusion and Treatment may vary due to age or medical condition  Airway Management Planned: Simple Face Mask  Additional Equipment:   Intra-op Plan:   Post-operative Plan:   Informed Consent: I have reviewed the patients History and Physical, chart, labs and discussed the procedure including the risks, benefits and alternatives for the proposed anesthesia with the patient or authorized representative who has indicated his/her understanding and acceptance.     Dental advisory given  Plan Discussed with: CRNA and Surgeon  Anesthesia Plan Comments:         Anesthesia Quick Evaluation

## 2022-07-14 NOTE — Op Note (Signed)
[facility] Patient Name: Stacy Hill Procedure Date: 07/14/2022 MRN: [id number] Attending MD: Jackquline Denmark , MD Date of Birth: 29-Jun-1971 CSN: [id number] Age: 51 Admit Type: Outpatient Procedure:                Upper GI endoscopy Indications:              Epigastric abdominal pain. 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 Estimated Blood Loss:     Estimated blood loss: none. Procedure:                Pre-Anesthesia Assessment:                           - Prior to the procedure, a History and Physical                            was performed, and patient medications and                            allergies were reviewed. The patient's tolerance of                            previous anesthesia was also reviewed. The risks                            and benefits of the procedure and the sedation                            options and risks were discussed with the patient.                            All questions were answered, and informed consent                            was obtained. Prior Anticoagulants: The  patient has                            taken no previous anticoagulant or antiplatelet                            agents. ASA Grade Assessment: III - A patient with                            severe systemic disease. After reviewing the risks                            and benefits, the patient was deemed in                            satisfactory  condition to undergo the procedure.                           After obtaining informed consent, the endoscope was                            passed under direct vision. Throughout the                            procedure, the patient's blood pressure, pulse, and                            oxygen saturations were monitored continuously. The                            [id number]-H190 ([id number]) Olympus endoscope was introduced                            through the mouth, and advanced to the second part                            of duodenum. The upper GI endoscopy was                            accomplished without difficulty. The patient                            tolerated the procedure well. Scope In: Scope Out: Findings:      The examined esophagus was normal. Biopsies were obtained from the       proximal and distal esophagus with cold forceps for histology to r/o       eosinophilic esophagitis.      One non-bleeding linear gastric ulcer with black eschar with no stigmata       of bleeding was found in the gastric antrum. The lesion was 10 mm in       largest dimension. Biopsies were taken with a cold forceps for       histology. There was moderate surrounding gastritis. Multiple biopsies       were also obtained using Dominica protocol.  Evidence of a sleeve gastrectomy was found in the stomach. This was       characterized by healthy appearing mucosa. No submucosal lesions were       noted.      The examined duodenum was normal. Impression:               - Non-bleeding gastric ulcer with surrounding                            gastritis. Biopsied.                           - S/P sleeve gastrectomy. Moderate Sedation:      Not Applicable - Patient had care per Anesthesia. Recommendation:           - Patient has a contact number available for                            emergencies. The signs and symptoms of potential                            delayed complications were discussed with  the                            patient. Return to normal activities tomorrow.                            Written discharge instructions were provided to the                            patient.                           - Resume previous diet.                           - Start Protonix 40 mg p.o. twice daily                           - No aspirin, ibuprofen, naproxen, or other                            non-steroidal anti-inflammatory drugs.                           - Await pathology results.                           - Recommend repeating EGD in 12 weeks to ensure                            healing (given her previous history).                           - The findings and recommendations were discussed  with the patient's family. Procedure Code(s):        --- Professional ---                           [phone], Esophagogastroduodenoscopy, flexible,                            transoral; with biopsy, single or multiple Diagnosis Code(s):        --- Professional ---                           K25.9, Gastric ulcer, unspecified as acute or                            chronic, without hemorrhage or perforation                           Z98.84, Bariatric surgery status                           R10.13, Epigastric pain CPT copyright 2019 American Medical Association. All rights reserved. The codes documented in this report are preliminary and upon coder review may  be revised to meet current compliance requirements. Jackquline Denmark, MD 07/14/2022 8:22:49 AM This report has been signed electronically. Number of Addenda: 0

## 2022-07-14 NOTE — Op Note (Signed)
[facility] Patient Name: Stacy Hill Procedure Date: 07/14/2022 MRN: [id number] Attending MD: Jackquline Denmark , MD Date of Birth: [birth year] CSN: [id number] Age: 51 Admit Type: Outpatient Procedure:                Colonoscopy Indications:              Colon cancer screening in patient at increased                            risk: Family history of colon polyps in multiple                            1st-degree relatives (mom >60 and sis <60) Providers:                Jackquline Denmark, MD, Burtis Junes, RN, Luan Moore,                            Technician, Brien Mates, RNFA Referring MD:              Medicines:                Monitored Anesthesia Care Complications:            No immediate complications. Estimated Blood Loss:     Estimated blood loss: none. Procedure:                Pre-Anesthesia Assessment:                           - Prior to the procedure, a History and Physical                            was performed, and patient medications and                            allergies were reviewed. The patient's tolerance of                            previous anesthesia was also reviewed. The risks                            and benefits of the procedure and the sedation                            options and risks were discussed with the patient.                            All questions were answered, and informed consent                            was obtained. Prior Anticoagulants: The patient has                            taken no previous anticoagulant or antiplatelet  agents. ASA Grade Assessment: III - A patient with                            severe systemic disease. After reviewing the risks                            and benefits, the patient was deemed in                            satisfactory condition to undergo the procedure.                           After obtaining informed consent, the colonoscope                             was passed under direct vision. Throughout the                            procedure, the patient's blood pressure, pulse, and                            oxygen saturations were monitored continuously. The                            [id number]-HQ190L ([id number] Olympus colonoscope was                            introduced through the anus and advanced to the the                            cecum, identified by appendiceal orifice and                            ileocecal valve. The colonoscopy was performed                            without difficulty. The patient tolerated the                            procedure well. The quality of the bowel                            preparation was good except in some areas of the                            colon with adherent stool which could not be fully                            washed despite aggressive suctioning and                            aspiration. The colon was redundant and was  somewhat difficult to examine d/t body habitus. The                            ileocecal valve, appendiceal orifice, and rectum                            were photographed. Scope In: 7:57:10 AM Scope Out: 8:14:36 AM Scope Withdrawal Time: 0 hours 11 minutes 49 seconds  Total Procedure Duration: 0 hours 17 minutes 26 seconds  Findings:      The colon (entire examined portion) appeared normal.      A few medium-mouthed diverticula were found in the sigmoid colon,       transverse colon and ascending colon.      Non-bleeding internal hemorrhoids were found during retroflexion. The       hemorrhoids were small.      The exam was otherwise without abnormality on direct and retroflexion       views. Impression:               - Mild pancolonic diverticulosis.                           - Non-bleeding internal hemorrhoids.                           - The examination was otherwise normal on direct                            and  retroflexion views.                           - No specimens collected. Moderate Sedation:      Not Applicable - Patient had care per Anesthesia. Recommendation:           - Patient has a contact number available for                            emergencies. The signs and symptoms of potential                            delayed complications were discussed with the                            patient. Return to normal activities tomorrow.                            Written discharge instructions were provided to the                            patient.                           - High fiber diet.                           - Continue present medications.                           -  Repeat colonoscopy in 5 years for screening                            purposes with 2-day prep. Earlier, if with any new                            problems or change in family history.                           - The findings and recommendations were discussed                            with the patient's family. Procedure Code(s):        --- Professional ---                           G3875, Colorectal cancer screening; colonoscopy on                            individual at high risk Diagnosis Code(s):        --- Professional ---                           Z83.71, Family history of colonic polyps                           K64.8, Other hemorrhoids                           K57.30, Diverticulosis of large intestine without                            perforation or abscess without bleeding CPT copyright 2019 American Medical Association. All rights reserved. The codes documented in this report are preliminary and upon coder review may  be revised to meet current compliance requirements. Jackquline Denmark, MD 07/14/2022 8:27:15 AM This report has been signed electronically. Number of Addenda: 0

## 2022-07-14 NOTE — Anesthesia Postprocedure Evaluation (Signed)
Anesthesia Post Note  Patient: Neave Lenger  Procedure(s) Performed: COLONOSCOPY WITH PROPOFOL ESOPHAGOGASTRODUODENOSCOPY (EGD) WITH PROPOFOL BIOPSY     Patient location during evaluation: PACU Anesthesia Type: MAC Level of consciousness: awake and alert Pain management: pain level controlled Vital Signs Assessment: post-procedure vital signs reviewed and stable Respiratory status: spontaneous breathing, nonlabored ventilation, respiratory function stable and patient connected to nasal cannula oxygen Cardiovascular status: stable and blood pressure returned to baseline Postop Assessment: no apparent nausea or vomiting Anesthetic complications: no   No notable events documented.  Last Vitals:  Vitals:   07/14/22 0821 07/14/22 0836  BP: (!) 143/65 125/89  Pulse: 88 73  Resp: 15 10  Temp: (!) 36.2 C   SpO2: 95% 100%    Last Pain:  Vitals:   07/14/22 0836  TempSrc:   PainSc: 0-No pain                 Lexys Milliner S

## 2022-07-14 NOTE — Transfer of Care (Signed)
Immediate Anesthesia Transfer of Care Note  Patient: [name]  Procedure(s) Performed: COLONOSCOPY WITH PROPOFOL ESOPHAGOGASTRODUODENOSCOPY (EGD) WITH PROPOFOL BIOPSY  Patient Location: PACU and Endoscopy Unit  Anesthesia Type:MAC  Level of Consciousness: awake, alert  and oriented  Airway & Oxygen Therapy: Patient Spontanous Breathing and Patient connected to nasal cannula oxygen  Post-op Assessment: Report given to RN, Post -op Vital signs reviewed and stable and pulse ox 100%  Pulse 88  Post vital signs: Reviewed and stable  Last Vitals:  Vitals Value Taken Time  BP 143/65 07/14/22 0820  Temp    Pulse    Resp 13 07/14/22 0821  SpO2    Vitals shown include unvalidated device data.  Last Pain:  Vitals:   07/14/22 0712  TempSrc: Tympanic  PainSc: 0-No pain         Complications: No notable events documented.

## 2022-07-14 NOTE — H&P (Signed)
     Chief Complaint: GI complaints   Referring Provider:  Duran, Michael R, PA-C        ASSESSMENT AND PLAN;    #1. Refractory GERD. H/O gastric GIST, s/p lap gastric sleeve as below   #2. IBS with alt diarrhea/constipation   #3. Sis/mom with polyps. Pt due for screening colonoscopy.   Plan: -EGD/colon at [facility] d/t BMI>50.  -Has appt with Dr Riedel ([facility] onc). She will get CT AP at [facility] after IV contrast desensitization -Omeprazole 40mg po BID -Carafate elixir 1g QID x 2 weeks. 2RF -Hold phentermine 1 week before EGD/colon.       I discussed EGD/Colonoscopy- the indications, risks, alternatives and potential complications including, but not limited to bleeding, infection, reaction to meds, damage to internal organs, cardiac and/or pulmonary problems, and perforation requiring surgery. The possibility that significant findings could be missed was explained. All ? were answered. Pt consents to proceed.   For EGD/colon today Consent obtained CT AP 04/14/2022 at [facility]- NEG for recurrence HPI:     Stacy Hill is a 50 y.o. female  With gastric GIST s/p lap resection 2015, lap gastric sleeve 03/2017 (lost 405 to 210, then covid related wt gain), OSA, GERD, anxiety/depression, obesity   With worsening GERD without dysphagia or odynophagia. C/O heartburn, nausea, waterbrash most prominently since gastric sleeve surgery. No vomiting. Lately has been having epi pain especially after eating Symptoms despite omeprazole 40 BID Has tried Protonix in past without any significant benefit. Had EGD 4 to 5 years ago at [facility]-does not recall the results.  Reports not available.     Also has longstanding alternating constipation with diarrhea. Dx with IBS.  No melena or hematochezia.  Has associated Abdo bloating.  No unintentional weight loss.  Never had colonoscopy.     No jaundice dark urine or pale stools.   No sodas, chocolates, chewing gums, artificial sweeteners  and candy. No NSAIDs.   Has made appt with Dr. Riedel (oncology) at [facility].  She will get CT done at [facility].     Review of previous records regarding Gastric GIST: ([facility]) A. Hysterectomy on 02/13/13 with evaluation of postoperative seroma by CT on 03/13/13 incidentally revealing a 7.7 x 5.4 x 4.8 cm mass immediately adjacent to greater curvature of stomach B. 04/10/13 FNA of stomach mass with pathology revealing gastrointestinal stromal tumor, CD117+ C. Evaluated by Dr. Michael Meyers at [facility] with recommendation of neoadjuvant imatinib to potentially shrink tumor to allow a laparoscopic surgery. D. 04/25/13 medical oncologist Dr. Autumn McRee recommended neoadjuvant imatinib  E. Initiated imatinib 400 mg daily 05/02/2013, with plan to reimage in 3 months F. 07/03/13 evaluated as a new patient by Dr. Riedel G. 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 CT abd/pelvis 07/25/13 reveals decrease in size of gastric mass I. CT abd/pelv from 10/30/13 revealing stable disease J. Status post laparoscopic partial gastrectomy 11/15/13 with pathology revealing 4.0 x 2.6 cm tumor, mitotic rate 1/50 hpf, negative margins K. 12/26/13 initiated adjuvant imatinib 400mg once daily L. Serial imaging surveillance recommended M. 03/24/14: self d/ced Gleevec due to increased fatigue N. 04/02/14: CT A/P: interval resection of a stomach greater curvature mass and no change in retroperitoneal lymphadenopathy O. Attempted re-initiation of imatinib at 200 mg/day with titration to 400 mg daily P. Serial surveillance revealing no evidence of locally recurrent or metastatic disease. Imatinib discontinued ~ 07/2017 d/t non-compliance.   Past GI work-up: CT Abdo/pelvis with IV contrast at [facility] (after contrast desensitization)   11/2018. FU GIST -No evidence of metastatic disease within the abdomen or pelvis.    CT AP without contrast 07/2017 1. Mildly dilated fluid-filled small bowel with decompressed terminal ileum. Appearance is nonspecific but  could indicate ileus or early obstruction. Follow-up as clinically indicated. 2. Postoperative changes consistent with gastric sleeve. 3. Postoperative changes in the anterior abdominal wall.       Past Medical History:  Diagnosis Date   Anemia     Anxiety      Last visit to [facility] 2008 gave birth to stillborn, pt teary today discussing   Bowel obstruction ([place])     Cancer ([place])     Depression     Fibroid     Gastrointestinal stromal tumor (GIST) ([place])     GERD (gastroesophageal reflux disease)      Takes Tums prn.   Headache(784.0)     IBS (irritable bowel syndrome)     Nausea and vomiting 09/15/2014   Obesity     Restless leg syndrome     Seasonal allergies     Shortness of breath      On exhertion,recently,with anemia per pt.   Sleep apnea     Stromal tumor of digestive system [facility])             Past Surgical History:  Procedure Laterality Date   ABDOMINAL HYSTERECTOMY N/A 02/13/2013    Procedure: HYSTERECTOMY ABDOMINAL WITH BILATERAL SALPINGECTOMY WITH EXTENSIVE LYSIS OF ADHESIONS;  Surgeon: Princess Bruins, MD;  Location: [place] [facility];  Service: Gynecology;  Laterality: N/A;   CESAREAN SECTION       CHOLECYSTECTOMY       GASTRECTOMY        partial   LAPAROSCOPIC GASTRIC SLEEVE RESECTION               Family History  Problem Relation Age of Onset   Cancer Mother          Bladder cancer   Hypertension Mother          Living, 50   COPD Mother     Esophageal cancer Mother     Stomach cancer Mother     Other Father          Died, 41s   Hypertension Sister     Irritable bowel syndrome Maternal Grandmother     Heart attack Maternal Grandfather     Irritable bowel syndrome Maternal Aunt          x multiple aunt's   Miscarriages / [place] Son        Social History         Tobacco Use   Smoking status: Never   Smokeless tobacco: Never  Vaping Use   Vaping Use: Never used  Substance Use Topics   Alcohol use: Yes      Alcohol/week: 0.0 standard drinks of  alcohol      Comment: occasional    Drug use: Not Currently      Types: Marijuana            Current Outpatient Medications  Medication Sig Dispense Refill   diphenhydrAMINE (BENADRYL) 25 MG tablet Take 100 mg by mouth at bedtime as needed.       hydrOXYzine (ATARAX) 50 MG tablet Take 100 mg by mouth at bedtime.       omeprazole (PRILOSEC) 40 MG capsule Take 80 mg by mouth at bedtime.       ondansetron (ZOFRAN-ODT) 4 MG disintegrating tablet One as needed  phentermine 37.5 MG capsule Take 1 capsule by mouth daily.        No current facility-administered medications for this visit.          Allergies  Allergen Reactions   Chocolate Flavor Hives   Ivp Dye [Iodinated Contrast Media] Hives   Shellfish Allergy Hives   Strawberry Extract Hives      Review of Systems:  Constitutional: Denies fever, chills, diaphoresis, appetite change and has fatigue.  HEENT: Denies photophobia, eye pain, redness, hearing loss, ear pain, congestion, sore throat, rhinorrhea, sneezing, mouth sores, neck pain, neck stiffness and tinnitus.   Respiratory: Denies SOB, DOE, cough, chest tightness,  and wheezing.   Cardiovascular: Denies chest pain, palpitations and leg swelling.  Genitourinary: Denies dysuria, urgency, frequency, hematuria, flank pain and difficulty urinating.  Musculoskeletal: Denies myalgias, has back pain, joint swelling, arthralgias and gait problem.  Skin: No rash.  Neurological: Denies dizziness, seizures, syncope, weakness, light-headedness, numbness and headaches.  Hematological: Denies adenopathy. Easy bruising, personal or family bleeding history  Psychiatric/Behavioral: Has anxiety or depression       Physical Exam:     Pulse 90   Ht 5' 4" (1.626 m)   Wt (!) 332 lb (150.6 kg)   LMP 01/10/2013   BMI 56.99 kg/m     Wt Readings from Last 3 Encounters:  04/12/22 (!) 332 lb (150.6 kg)  12/22/21 (!) 336 lb (152.4 kg)  09/05/20 299 lb 3.2 oz (135.7 kg)     Constitutional:  Well-developed, in no acute distress. Psychiatric: Normal mood and affect. Behavior is normal. HEENT: Pupils normal.  Conjunctivae are normal. No scleral icterus. Neck supple.  Cardiovascular: Normal rate, regular rhythm. No edema Pulmonary/chest: Effort normal and breath sounds normal. No wheezing, rales or rhonchi. Abdominal: Soft, nondistended. Epi tender. Bowel sounds active throughout. There are no masses palpable. No hepatomegaly.  Well-healed surgical scars. Rectal: Deferred Neurological: Alert and oriented to person place and time. Skin: Skin is warm and dry. No rashes noted.    Carmell Austria, MD [facility] [phone]

## 2022-07-14 NOTE — Discharge Instructions (Signed)
YOU HAD AN ENDOSCOPIC PROCEDURE TODAY: Refer to the procedure report and other information in the discharge instructions given to you for any specific questions about what was found during the examination. If this information does not answer your questions, please call [place] office at [phone] to clarify.  ° °YOU SHOULD EXPECT: Some feelings of bloating in the abdomen. Passage of more gas than usual. Walking can help get rid of the air that was put into your GI tract during the procedure and reduce the bloating. If you had a lower endoscopy (such as a colonoscopy or flexible sigmoidoscopy) you may notice spotting of blood in your stool or on the toilet paper. Some abdominal soreness may be present for a day or two, also. ° °DIET: Your first meal following the procedure should be a light meal and then it is ok to progress to your normal diet. A half-sandwich or bowl of soup is an example of a good first meal. Heavy or fried foods are harder to digest and may make you feel nauseous or bloated. Drink plenty of fluids but you should avoid alcoholic beverages for 24 hours. If you had a esophageal dilation, please see attached instructions for diet.   ° °ACTIVITY: Your care partner should take you home directly after the procedure. You should plan to take it easy, moving slowly for the rest of the day. You can resume normal activity the day after the procedure however YOU SHOULD NOT DRIVE, use power tools, machinery or perform tasks that involve climbing or major physical exertion for 24 hours (because of the sedation medicines used during the test).  ° °SYMPTOMS TO REPORT IMMEDIATELY: °A gastroenterologist can be reached at any hour. Please call [phone]  for any of the following symptoms:  °Following lower endoscopy (colonoscopy, flexible sigmoidoscopy) °Excessive amounts of blood in the stool  °Significant tenderness, worsening of abdominal pains  °Swelling of the abdomen that is new, acute  °Fever of 100° or  higher  °Following upper endoscopy (EGD, EUS, ERCP, esophageal dilation) °Vomiting of blood or coffee ground material  °New, significant abdominal pain  °New, significant chest pain or pain under the shoulder blades  °Painful or persistently difficult swallowing  °New shortness of breath  °Black, tarry-looking or red, bloody stools ° °FOLLOW UP:  °If any biopsies were taken you will be contacted by phone or by letter within the next 1-3 weeks. Call [phone]  if you have not heard about the biopsies in 3 weeks.  °Please also call with any specific questions about appointments or follow up tests. ° °

## 2022-07-15 LAB — SURGICAL PATHOLOGY

## 2022-07-18 ENCOUNTER — Encounter (HOSPITAL_COMMUNITY): Payer: Self-pay | Admitting: Gastroenterology

## 2022-07-30 ENCOUNTER — Encounter: Payer: Self-pay | Admitting: Gastroenterology

## 2022-10-03 ENCOUNTER — Telehealth: Payer: Self-pay

## 2022-10-03 NOTE — Telephone Encounter (Signed)
Message Received: Today  Personal reminder Gillermina Hu, RN  Gillermina Hu, RN Personal reminder sent 08/01/2022 Dr. Lyndel Safe recommends repeat EGD to ensure healing of the ulcer:

## 2022-10-03 NOTE — Telephone Encounter (Signed)
Received reminder in Epic to call and schedule pt for an repeat EGD: Left message for pt to call back

## 2022-10-06 NOTE — Telephone Encounter (Signed)
Left message for pt to call back  °

## 2022-10-13 ENCOUNTER — Other Ambulatory Visit: Payer: Self-pay

## 2022-10-13 DIAGNOSIS — K259 Gastric ulcer, unspecified as acute or chronic, without hemorrhage or perforation: Secondary | ICD-10-CM

## 2022-10-13 NOTE — Telephone Encounter (Signed)
Pt was made aware of Dr. Lyndel Safe recommendations from prior EGD.  Pt was scheduled for an EGD with Dr. Lyndel Safe on 11/01/2022 at 10:00 AM. Pt to arrive at 9:00 AM; Pt made aware: Prep instructions were sent to pt via my chart. Pt made aware: Ambulatory referral to GI placed in Epic:  Pt verbalized understanding with all questions answered.

## 2022-10-27 ENCOUNTER — Telehealth: Payer: Self-pay | Admitting: *Deleted

## 2022-10-27 NOTE — Telephone Encounter (Signed)
Yesi,  This pt's BMI is greater than 50; their procedure will need to be performed at the hospital.  Thanks,  Kamarius Buckbee 

## 2022-10-27 NOTE — Telephone Encounter (Signed)
Dr. Lyndel Safe,  This pt is scheduled with you on January 16.  Her BMI is greater than 50; their procedure will need to be performed at the hospital.  Thanks,  Osvaldo Angst

## 2022-10-30 NOTE — Telephone Encounter (Signed)
Lets get her procedure done at [facility] due to elevated BMI RG

## 2022-10-31 NOTE — Telephone Encounter (Signed)
LVM for patient to call back. [facility] procedure is cancelled. She will need EGD at hospital

## 2022-10-31 NOTE — Telephone Encounter (Signed)
Patient called, advised she was on a wait list and would receive a call when an appointment opened up. Patient verbalized understanding.

## 2022-11-01 ENCOUNTER — Encounter: Payer: BC Managed Care – PPO | Admitting: Gastroenterology

## 2022-11-10 NOTE — Telephone Encounter (Signed)
Appointment cancelled. Will be rescheduled at the hospital.

## 2022-11-17 NOTE — Telephone Encounter (Signed)
LVM for patient to call back. Schedule is out for April and it is filling up in the hospital and need to see if she do the procedure. She will need nurse visit as well.

## 2022-11-17 NOTE — Addendum Note (Signed)
Addended by: Curlene Labrum E on: 11/17/2022 05:12 PM   Modules accepted: Orders

## 2022-11-18 NOTE — Telephone Encounter (Signed)
AMB referral done

## 2022-11-18 NOTE — Telephone Encounter (Signed)
Scheduled 01-02-2023 at [facility] and previsit on 11-24-2022 telephone

## 2022-11-21 ENCOUNTER — Telehealth: Payer: Self-pay | Admitting: *Deleted

## 2022-11-21 NOTE — Telephone Encounter (Signed)
Yesi,  This pt's BMI is greater than 50; their procedure will need to be performed at the hospital.  Thanks,  Araiya Tilmon 

## 2022-11-24 ENCOUNTER — Encounter: Payer: Self-pay | Admitting: Gastroenterology

## 2022-11-24 ENCOUNTER — Ambulatory Visit (AMBULATORY_SURGERY_CENTER): Payer: BC Managed Care – PPO

## 2022-11-24 VITALS — Ht 63.0 in | Wt 300.0 lb

## 2022-11-24 DIAGNOSIS — K219 Gastro-esophageal reflux disease without esophagitis: Secondary | ICD-10-CM

## 2022-11-24 DIAGNOSIS — K259 Gastric ulcer, unspecified as acute or chronic, without hemorrhage or perforation: Secondary | ICD-10-CM

## 2022-11-24 DIAGNOSIS — Z8509 Personal history of malignant neoplasm of other digestive organs: Secondary | ICD-10-CM

## 2022-11-24 NOTE — Progress Notes (Signed)
No egg or soy allergy known to patient  No issues known to pt with past sedation with any surgeries or procedures Patient denies ever being told they had issues or difficulty with intubation  No FH of Malignant Hyperthermia On diet pills Phentermine Pt is not on  home 02  Pt is not on blood thinners  Pt denies issues with constipation  No A fib or A flutter Have any cardiac testing pending--no Pt instructed to use Singlecare.com or GoodRx for a price reduction on prep  Patient's chart reviewed by Osvaldo Angst CNRA prior to previsit and patient appropriate for the [place].  Previsit completed and red dot placed by patient's name on their procedure day (on provider's schedule).

## 2022-12-02 NOTE — Telephone Encounter (Signed)
Procedure scheduled at hospital 01-02-23 w/Dr Lyndel Safe.

## 2022-12-26 ENCOUNTER — Encounter (HOSPITAL_COMMUNITY): Payer: Self-pay | Admitting: Gastroenterology

## 2023-01-02 ENCOUNTER — Encounter (HOSPITAL_COMMUNITY)
Admission: RE | Disposition: A | Discharge status: Home / Self Care | Payer: Self-pay | Source: Home / Self Care | Attending: Gastroenterology

## 2023-01-02 ENCOUNTER — Ambulatory Visit (HOSPITAL_COMMUNITY): Payer: BC Managed Care – PPO | Admitting: Anesthesiology

## 2023-01-02 ENCOUNTER — Ambulatory Visit (HOSPITAL_COMMUNITY)
Admission: RE | Admit: 2023-01-02 | Discharge: 2023-01-02 | Disposition: A | Discharge status: Home / Self Care | Payer: BC Managed Care – PPO | Attending: Gastroenterology | Admitting: Gastroenterology

## 2023-01-02 ENCOUNTER — Encounter (HOSPITAL_COMMUNITY): Payer: Self-pay | Admitting: Gastroenterology

## 2023-01-02 DIAGNOSIS — Z9884 Bariatric surgery status: Secondary | ICD-10-CM | POA: Insufficient documentation

## 2023-01-02 DIAGNOSIS — Z8711 Personal history of peptic ulcer disease: Secondary | ICD-10-CM | POA: Insufficient documentation

## 2023-01-02 DIAGNOSIS — J45909 Unspecified asthma, uncomplicated: Secondary | ICD-10-CM | POA: Insufficient documentation

## 2023-01-02 DIAGNOSIS — G4730 Sleep apnea, unspecified: Secondary | ICD-10-CM | POA: Insufficient documentation

## 2023-01-02 DIAGNOSIS — F418 Other specified anxiety disorders: Secondary | ICD-10-CM | POA: Insufficient documentation

## 2023-01-02 DIAGNOSIS — K319 Disease of stomach and duodenum, unspecified: Secondary | ICD-10-CM | POA: Insufficient documentation

## 2023-01-02 DIAGNOSIS — K317 Polyp of stomach and duodenum: Secondary | ICD-10-CM | POA: Insufficient documentation

## 2023-01-02 DIAGNOSIS — Z6841 Body Mass Index (BMI) 40.0 and over, adult: Secondary | ICD-10-CM | POA: Insufficient documentation

## 2023-01-02 DIAGNOSIS — K219 Gastro-esophageal reflux disease without esophagitis: Secondary | ICD-10-CM | POA: Insufficient documentation

## 2023-01-02 DIAGNOSIS — Z1211 Encounter for screening for malignant neoplasm of colon: Secondary | ICD-10-CM

## 2023-01-02 DIAGNOSIS — K2970 Gastritis, unspecified, without bleeding: Secondary | ICD-10-CM | POA: Insufficient documentation

## 2023-01-02 DIAGNOSIS — Z8509 Personal history of malignant neoplasm of other digestive organs: Secondary | ICD-10-CM

## 2023-01-02 HISTORY — PX: ESOPHAGOGASTRODUODENOSCOPY (EGD) WITH PROPOFOL: SHX5813

## 2023-01-02 HISTORY — PX: BIOPSY: SHX5522

## 2023-01-02 SURGERY — ESOPHAGOGASTRODUODENOSCOPY (EGD) WITH PROPOFOL
Anesthesia: Monitor Anesthesia Care

## 2023-01-02 MED ORDER — PROPOFOL 500 MG/50ML IV EMUL
INTRAVENOUS | Status: AC
  Filled 2023-01-02: qty 50

## 2023-01-02 MED ORDER — LACTATED RINGERS IV SOLN: INTRAVENOUS | Status: DC

## 2023-01-02 MED ORDER — PROPOFOL 10 MG/ML IV BOLUS
INTRAVENOUS | Status: DC | PRN
  Administered 2023-01-02 (×2): 30 mg via INTRAVENOUS
  Administered 2023-01-02: 20 mg via INTRAVENOUS

## 2023-01-02 MED ORDER — PROPOFOL 500 MG/50ML IV EMUL
INTRAVENOUS | Status: DC | PRN
  Administered 2023-01-02: 125 ug/kg/min via INTRAVENOUS

## 2023-01-02 MED ORDER — SODIUM CHLORIDE 0.9 % IV SOLN: INTRAVENOUS | Status: DC

## 2023-01-02 MED ORDER — LIDOCAINE 2% (20 MG/ML) 5 ML SYRINGE
INTRAMUSCULAR | Status: DC | PRN
  Administered 2023-01-02: 80 mg via INTRAVENOUS

## 2023-01-02 SURGICAL SUPPLY — 15 items

## 2023-01-02 NOTE — Anesthesia Preprocedure Evaluation (Addendum)
Anesthesia Evaluation  Patient identified by MRN, date of birth, ID band Patient awake    Reviewed: Allergy & Precautions, NPO status , Patient's Chart, lab work & pertinent test results  Airway Mallampati: II  TM Distance: >3 FB Neck ROM: Full    Dental  (+) Teeth Intact, Dental Advisory Given   Pulmonary asthma , sleep apnea    Pulmonary exam normal breath sounds clear to auscultation       Cardiovascular negative cardio ROS Normal cardiovascular exam Rhythm:Regular Rate:Normal     Neuro/Psych  Headaches PSYCHIATRIC DISORDERS Anxiety Depression       GI/Hepatic Neg liver ROS,GERD  Medicated,,Gerd gastric ulcer History of gastrointestinal stromal tumor   Endo/Other    Morbid obesity  Renal/GU negative Renal ROS     Musculoskeletal negative musculoskeletal ROS (+)    Abdominal   Peds  Hematology negative hematology ROS (+)   Anesthesia Other Findings Day of surgery medications reviewed with the patient.  Reproductive/Obstetrics                             Anesthesia Physical Anesthesia Plan  ASA: 3  Anesthesia Plan: MAC   Post-op Pain Management: Minimal or no pain anticipated   Induction: Intravenous  PONV Risk Score and Plan: 2 and TIVA and Treatment may vary due to age or medical condition  Airway Management Planned: Natural Airway and Simple Face Mask  Additional Equipment:   Intra-op Plan:   Post-operative Plan:   Informed Consent: I have reviewed the patients History and Physical, chart, labs and discussed the procedure including the risks, benefits and alternatives for the proposed anesthesia with the patient or authorized representative who has indicated his/her understanding and acceptance.     Dental advisory given  Plan Discussed with: CRNA and Anesthesiologist  Anesthesia Plan Comments:         Anesthesia Quick Evaluation

## 2023-01-02 NOTE — Transfer of Care (Signed)
Immediate Anesthesia Transfer of Care Note  Patient: Stacy Hill  Procedure(s) Performed: ESOPHAGOGASTRODUODENOSCOPY (EGD) WITH PROPOFOL BIOPSY  Patient Location: PACU  Anesthesia Type:MAC  Level of Consciousness: awake, alert , and oriented  Airway & Oxygen Therapy: Patient Spontanous Breathing and Patient connected to face mask oxygen  Post-op Assessment: Report given to RN and Post -op Vital signs reviewed and stable  Post vital signs: Reviewed and stable  Last Vitals:  Vitals Value Taken Time  BP    Temp    Pulse    Resp    SpO2      Last Pain:  Vitals:   01/02/23 0705  TempSrc: Temporal  PainSc: 4          Complications: No notable events documented.

## 2023-01-02 NOTE — Op Note (Signed)
[facility] Patient Name: Stacy Hill Procedure Date: 01/02/2023 MRN: [id number] Attending MD: Jackquline Denmark , MD, [id number] Date of Birth: Sep 12, 1971 CSN: [id number] Age: 52 Admit Type: Outpatient Procedure:                Upper GI endoscopy Indications:              1. 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 2. 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 Providers:                Jackquline Denmark, MD, Mikey College, RN, Brien Mates, Technician Referring MD:              Medicines:                Monitored Anesthesia Care Complications:            No immediate complications. Estimated Blood Loss:     Estimated blood loss: none. Procedure:                Pre-Anesthesia Assessment:                           - Prior to the procedure, a History and Physical                            was performed, and patient medications and                            allergies were reviewed. The patient's tolerance of                            previous anesthesia was also reviewed. The risks                            and benefits of the procedure and the sedation                            options and risks were discussed with the patient.                            All questions were answered, and informed consent                            was obtained. Prior Anticoagulants: The patient has                            taken no anticoagulant or antiplatelet agents. ASA                            Grade Assessment: III - A patient with severe  systemic disease. After reviewing the risks and                            benefits, the patient was deemed in satisfactory                            condition to undergo the procedure.                           After obtaining informed consent, the endoscope was                            passed under direct vision. Throughout the                            procedure,  the patient's blood pressure, pulse, and                            oxygen saturations were monitored continuously. The                            [id number]-[id number] Olympus endoscope was introduced                            through the mouth, and advanced to the second part                            of duodenum. The upper GI endoscopy was                            accomplished without difficulty. The patient                            tolerated the procedure well. Scope In: Scope Out: Findings:      The examined esophagus was normal.      Localized moderate inflammation characterized by erythema and friability       was found in the gastric antrum. Biopsies were taken with a cold forceps       for histology. Complete healing of gastric ulcer.      Evidence of a sleeve gastrectomy was found in the stomach. A small 6 mm       nodule/polyp was visualized in the body of the stomach at the site of       previous scar (likely granulation tissue, doubt recurrence of GIST).       Biopsies were taken with a cold forceps for histology using tunnel       technique.      The examined duodenum was normal. Impression:               - Mod Gastritis. Biopsied.                           - A sleeve gastrectomy was found, characterized by  healthy appearing mucosa. Small nodule/polyp                            visualized at the site of previous scar- biopsied.                           - Normal examined duodenum. Moderate Sedation:      Not Applicable - Patient had care per Anesthesia. Recommendation:           - Patient has a contact number available for                            emergencies. The signs and symptoms of potential                            delayed complications were discussed with the                            patient. Return to normal activities tomorrow.                            Written discharge instructions were provided to the                             patient.                           - Resume previous diet.                           - Continue present medications.                           - Await pathology results.                           - No aspirin, ibuprofen, naproxen, or other                            non-steroidal anti-inflammatory drugs.                           - The findings and recommendations were discussed                            with the patient's family. Procedure Code(s):        --- Professional ---                           [phone], Esophagogastroduodenoscopy, flexible,                            transoral; with biopsy, single or multiple Diagnosis Code(s):        --- Professional ---                           K29.70, Gastritis, unspecified, without  bleeding                           Z98.84, Bariatric surgery status                           R10.13, Epigastric pain CPT copyright 2022 American Medical Association. All rights reserved. The codes documented in this report are preliminary and upon coder review may  be revised to meet current compliance requirements. Jackquline Denmark, MD 01/02/2023 8:02:46 AM This report has been signed electronically. Number of Addenda: 0

## 2023-01-02 NOTE — H&P (Addendum)
[facility] History and Physical   Primary Care Physician:  Glendon Axe, MD   Reason for Procedure:   H/O GU, Refractory GERD. H/O gastric GIST, s/p lap gastric sleeve   Plan:    EGD to ensure healing.     HPI: Stacy Hill is a 52 y.o. female    Past Medical History:  Diagnosis Date   Anemia    Anxiety    Last visit to [facility] 2008 gave birth to stillborn, pt teary today discussing   Asthma    seasonal   Bowel obstruction ([place])    Cancer ([place]) 2015   gist removed 3 years chemo   Depression    Fibroid    Gastrointestinal stromal tumor (GIST) ([place])    GERD (gastroesophageal reflux disease)    Takes Tums prn.   Headache(784.0)    IBS (irritable bowel syndrome)    Nausea and vomiting 09/15/2014   Nausea vomiting and diarrhea    Obesity    Restless leg syndrome    Seasonal allergies    Shortness of breath    On exhertion,recently,with anemia per pt.   Sleep apnea    Stromal tumor of digestive system [facility])     Past Surgical History:  Procedure Laterality Date   ABDOMINAL HYSTERECTOMY N/A 02/13/2013   Procedure: HYSTERECTOMY ABDOMINAL WITH BILATERAL SALPINGECTOMY WITH EXTENSIVE LYSIS OF ADHESIONS;  Surgeon: Princess Bruins, MD;  Location: [facility];  Service: Gynecology;  Laterality: N/A;   BIOPSY  07/14/2022   Procedure: BIOPSY;  Surgeon: Jackquline Denmark, MD;  Location: [facility];  Service: Gastroenterology;;   CESAREAN SECTION     CHOLECYSTECTOMY     COLONOSCOPY WITH PROPOFOL N/A 07/14/2022   Procedure: COLONOSCOPY WITH PROPOFOL;  Surgeon: Jackquline Denmark, MD;  Location: [facility];  Service: Gastroenterology;  Laterality: N/A;   ESOPHAGOGASTRODUODENOSCOPY (EGD) WITH PROPOFOL N/A 07/14/2022   Procedure: ESOPHAGOGASTRODUODENOSCOPY (EGD) WITH PROPOFOL;  Surgeon: Jackquline Denmark, MD;  Location: [facility];  Service: Gastroenterology;  Laterality: N/A;   GASTRECTOMY     partial   LAPAROSCOPIC GASTRIC SLEEVE RESECTION      Prior to Admission medications    Medication Sig Start Date End Date Taking? Authorizing Provider  escitalopram (LEXAPRO) 20 MG tablet Take 20 mg by mouth at bedtime. 06/17/22  Yes [provider]  hydrOXYzine (ATARAX) 50 MG tablet Take 100 mg by mouth at bedtime as needed for itching. 01/15/22  Yes [provider]  omeprazole (PRILOSEC) 40 MG capsule Take 40 mg by mouth daily as needed (acid reflux). 03/29/22  Yes [provider]  pantoprazole (PROTONIX) 40 MG tablet Take 1 tablet (40 mg total) by mouth 2 (two) times daily. 07/14/22 07/14/23 Yes Jackquline Denmark, MD  albuterol (VENTOLIN HFA) 108 (90 Base) MCG/ACT inhaler Inhale 2 puffs into the lungs every 6 (six) hours as needed for wheezing or shortness of breath.    [provider]  cetirizine (ZYRTEC) 10 MG tablet Take 10 mg by mouth daily as needed for allergies.    [provider]  diphenhydrAMINE (BENADRYL) 25 MG tablet Take 75 mg by mouth at bedtime as needed for allergies.    [provider]  hydrocortisone cream 1 % Apply 1 Application topically 2 (two) times daily as needed for itching (eczema).    [provider]  ibuprofen (ADVIL) 200 MG tablet Take 800 mg by mouth every 8 (eight) hours as needed for moderate pain.    [provider]  Multiple Vitamins-Minerals (BARIATRIC FUSION) CHEW Chew 2 each by  mouth daily.    [provider]  ondansetron (ZOFRAN) 4 MG tablet Take 4 mg by mouth every 8 (eight) hours as needed for nausea or vomiting. Patient not taking: Reported on 01/02/2023    [provider]  phentermine 37.5 MG capsule Take 37.5 mg by mouth daily. 11/08/17   [provider]  Potassium 99 MG TABS Take 99 mg by mouth daily as needed (cramps).    [provider]  prochlorperazine (COMPAZINE) 10 MG tablet Take 1 tablet by mouth every 6 (six) hours as needed for nausea or vomiting.  12/26/13 09/06/20  [provider]    Current Facility-Administered  Medications  Medication Dose Route Frequency Provider Last Rate Last Admin   0.9 %  sodium chloride infusion   Intravenous Continuous Jackquline Denmark, MD       lactated ringers infusion   Intravenous Continuous Jackquline Denmark, MD 10 mL/hr at 01/02/23 0710 New Bag at 01/02/23 0710    Allergies as of 11/17/2022 - Review Complete 07/14/2022  Allergen Reaction Noted   Chocolate flavor Hives 05/13/2014   Ivp dye [iodinated contrast media] Hives 11/25/2011   Latex Hives 07/11/2022   Shellfish allergy Hives 11/25/2011   Strawberry extract Hives 05/13/2014    Family History  Problem Relation Age of Onset   Cancer Mother        Bladder cancer   Hypertension Mother        Living, 55   COPD Mother    Esophageal cancer Mother    Stomach cancer Mother    Other Father        Died, 39s   Colon polyps Sister    Hypertension Sister    Irritable bowel syndrome Maternal Aunt        x multiple aunt's   Irritable bowel syndrome Maternal Grandmother    Heart attack Maternal Grandfather    Miscarriages / [place] Son    Colon cancer Neg Hx     Social History   Socioeconomic History   Marital status: Married    Spouse name: Not on file   Number of children: 2   Years of education: Not on file   Highest education level: Not on file  Occupational History   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   Lives with 57 year old son.   Social Determinants of Health   Financial  Resource Strain: Not on file  Food Insecurity: Not on file  Transportation Needs: Not on file  Physical Activity: Not on file  Stress: Not on file  Social Connections: Not on file  Intimate Partner Violence: Not on file    Review of Systems: Positive for none All other review of systems negative except as mentioned in the HPI.  Physical Exam: Vital signs in last 24 hours: Temp:  [97.6 F (36.4 C)] 97.6 F (36.4 C) (03/18 0705) Pulse Rate:  [74] 74 (03/18 0705) Resp:  [  14] 14 (03/18 0705) BP: (109)/(61) 109/61 (03/18 0705) SpO2:  [14 %] 14 % (03/18 0705) Weight:  [140.6 kg] 140.6 kg (03/18 0705)   General:   Alert,  Well-developed, well-nourished, pleasant and cooperative in NAD Lungs:  Clear throughout to auscultation.   Heart:  Regular rate and rhythm; no murmurs, clicks, rubs,  or gallops. Abdomen:  Soft, nontender and nondistended. Normal bowel sounds.   Neuro/Psych:  Alert and cooperative. Normal mood and affect. A and O x 3    No significant changes were identified.  The patient continues to be an appropriate candidate for the planned procedure and anesthesia.   Carmell Austria, MD. [facility] 01/02/2023 7:32 AM@

## 2023-01-02 NOTE — Discharge Instructions (Signed)
YOU HAD AN ENDOSCOPIC PROCEDURE TODAY: Refer to the procedure report and other information in the discharge instructions given to you for any specific questions about what was found during the examination. If this information does not answer your questions, please call [place] office at [phone] to clarify.  ° °YOU SHOULD EXPECT: Some feelings of bloating in the abdomen. Passage of more gas than usual. Walking can help get rid of the air that was put into your GI tract during the procedure and reduce the bloating. If you had a lower endoscopy (such as a colonoscopy or flexible sigmoidoscopy) you may notice spotting of blood in your stool or on the toilet paper. Some abdominal soreness may be present for a day or two, also. ° °DIET: Your first meal following the procedure should be a light meal and then it is ok to progress to your normal diet. A half-sandwich or bowl of soup is an example of a good first meal. Heavy or fried foods are harder to digest and may make you feel nauseous or bloated. Drink plenty of fluids but you should avoid alcoholic beverages for 24 hours. If you had a esophageal dilation, please see attached instructions for diet.   ° °ACTIVITY: Your care partner should take you home directly after the procedure. You should plan to take it easy, moving slowly for the rest of the day. You can resume normal activity the day after the procedure however YOU SHOULD NOT DRIVE, use power tools, machinery or perform tasks that involve climbing or major physical exertion for 24 hours (because of the sedation medicines used during the test).  ° °SYMPTOMS TO REPORT IMMEDIATELY: °A gastroenterologist can be reached at any hour. Please call [phone]  for any of the following symptoms:  °Following lower endoscopy (colonoscopy, flexible sigmoidoscopy) °Excessive amounts of blood in the stool  °Significant tenderness, worsening of abdominal pains  °Swelling of the abdomen that is new, acute  °Fever of 100° or  higher  °Following upper endoscopy (EGD, EUS, ERCP, esophageal dilation) °Vomiting of blood or coffee ground material  °New, significant abdominal pain  °New, significant chest pain or pain under the shoulder blades  °Painful or persistently difficult swallowing  °New shortness of breath  °Black, tarry-looking or red, bloody stools ° °FOLLOW UP:  °If any biopsies were taken you will be contacted by phone or by letter within the next 1-3 weeks. Call [phone]  if you have not heard about the biopsies in 3 weeks.  °Please also call with any specific questions about appointments or follow up tests. ° °

## 2023-01-03 ENCOUNTER — Encounter (HOSPITAL_COMMUNITY): Payer: Self-pay | Admitting: Gastroenterology

## 2023-01-03 LAB — SURGICAL PATHOLOGY

## 2023-01-03 NOTE — Anesthesia Postprocedure Evaluation (Signed)
Anesthesia Post Note  Patient: Cyaira Brunett  Procedure(s) Performed: ESOPHAGOGASTRODUODENOSCOPY (EGD) WITH PROPOFOL BIOPSY     Patient location during evaluation: Endoscopy Anesthesia Type: MAC Level of consciousness: oriented, awake and alert and awake Pain management: pain level controlled Vital Signs Assessment: post-procedure vital signs reviewed and stable Respiratory status: spontaneous breathing, nonlabored ventilation, respiratory function stable and patient connected to nasal cannula oxygen Cardiovascular status: blood pressure returned to baseline and stable Postop Assessment: no headache, no backache and no apparent nausea or vomiting Anesthetic complications: no   No notable events documented.  Last Vitals:  Vitals:   01/02/23 0820 01/02/23 0827  BP: 117/65 124/73  Pulse: 73 73  Resp: 16 16  Temp:    SpO2: 99% 100%    Last Pain:  Vitals:   01/02/23 0827  TempSrc:   PainSc: 0-No pain   Pain Goal:                   Santa Lighter

## 2023-01-07 ENCOUNTER — Encounter: Payer: Self-pay | Admitting: Gastroenterology

## 2023-08-21 ENCOUNTER — Other Ambulatory Visit: Payer: Self-pay

## 2023-08-21 ENCOUNTER — Encounter (HOSPITAL_BASED_OUTPATIENT_CLINIC_OR_DEPARTMENT_OTHER): Payer: Self-pay

## 2023-08-21 ENCOUNTER — Emergency Department (HOSPITAL_BASED_OUTPATIENT_CLINIC_OR_DEPARTMENT_OTHER)
Admission: EM | Admit: 2023-08-21 | Discharge: 2023-08-21 | Disposition: A | Discharge status: Home / Self Care | Payer: 59 | Attending: Emergency Medicine | Admitting: Emergency Medicine

## 2023-08-21 DIAGNOSIS — R059 Cough, unspecified: Secondary | ICD-10-CM | POA: Diagnosis present

## 2023-08-21 DIAGNOSIS — J069 Acute upper respiratory infection, unspecified: Secondary | ICD-10-CM | POA: Diagnosis not present

## 2023-08-21 DIAGNOSIS — Z91040 Latex allergy status: Secondary | ICD-10-CM | POA: Insufficient documentation

## 2023-08-21 MED ORDER — ONDANSETRON 4 MG PO TBDP: ORAL_TABLET | ORAL | 0 refills | Status: DC

## 2023-08-21 MED ORDER — DEXAMETHASONE 4 MG PO TABS
10.0000 mg | ORAL_TABLET | Freq: Once | ORAL | Status: AC
  Administered 2023-08-21: 10 mg via ORAL
  Filled 2023-08-21: qty 3

## 2023-08-21 MED ORDER — BENZONATATE 100 MG PO CAPS: 100.0000 mg | ORAL_CAPSULE | Freq: Three times a day (TID) | ORAL | 0 refills | Status: AC

## 2023-08-21 NOTE — ED Provider Notes (Signed)
[place] [facility] Provider Note   CSN: [id number] Arrival date & time: 08/21/23  0235     History  Chief Complaint  Patient presents with   Cough    Stacy Hill is a 52 y.o. female.  52 yo F with a chief concern of cough congestion subjective fevers and chills.  This been going on for a couple days.  Someone at work is also sick.  Has had some loose stools.  Denies nausea or vomiting.   Cough      Home Medications Prior to Admission medications   Medication Sig Start Date End Date Taking? Authorizing Provider  benzonatate (TESSALON) 100 MG capsule Take 1 capsule (100 mg total) by mouth every 8 (eight) hours. 08/21/23  Yes Melene Plan, DO  ondansetron (ZOFRAN-ODT) 4 MG disintegrating tablet 4mg  ODT q4 hours prn nausea/vomit 08/21/23  Yes Melene Plan, DO  albuterol (VENTOLIN HFA) 108 (90 Base) MCG/ACT inhaler Inhale 2 puffs into the lungs every 6 (six) hours as needed for wheezing or shortness of breath.    [provider]  cetirizine (ZYRTEC) 10 MG tablet Take 10 mg by mouth daily as needed for allergies.    [provider]  diphenhydrAMINE (BENADRYL) 25 MG tablet Take 75 mg by mouth at bedtime as needed for allergies.    [provider]  escitalopram (LEXAPRO) 20 MG tablet Take 20 mg by mouth at bedtime. 06/17/22   [provider]  hydrocortisone cream 1 % Apply 1 Application topically 2 (two) times daily as needed for itching (eczema).    [provider]  hydrOXYzine (ATARAX) 50 MG tablet Take 100 mg by mouth at bedtime as needed for itching. 01/15/22   [provider]  Multiple Vitamins-Minerals (BARIATRIC FUSION) CHEW Chew 2 each by mouth daily.    [provider]  ondansetron (ZOFRAN) 4 MG tablet Take 4 mg by mouth every 8 (eight) hours as needed for nausea or vomiting. Patient not taking: Reported on 01/02/2023    [provider]  pantoprazole (PROTONIX) 40 MG  tablet Take 1 tablet (40 mg total) by mouth 2 (two) times daily. 07/14/22 07/14/23  Lynann Bologna, MD  phentermine 37.5 MG capsule Take 37.5 mg by mouth daily. 11/08/17   [provider]  Potassium 99 MG TABS Take 99 mg by mouth daily as needed (cramps).    [provider]  prochlorperazine (COMPAZINE) 10 MG tablet Take 1 tablet by mouth every 6 (six) hours as needed for nausea or vomiting.  12/26/13 09/06/20  [provider]      Allergies    Chocolate flavor, Ivp dye [iodinated contrast media], Latex, Shellfish allergy, and Strawberry extract    Review of Systems   Review of Systems  Respiratory:  Positive for cough.     Physical Exam Updated Vital Signs BP 108/77 (BP Location: Right Arm)   Pulse 96   Temp 98.1 F (36.7 C) (Oral)   Resp (!) 22   Ht 5\' 3"  (1.6 m)   Wt (!) 140.6 kg   LMP 01/10/2013   SpO2 98%   BMI 54.91 kg/m  Physical Exam Vitals and nursing note reviewed.  Constitutional:      General: She is not in acute distress.    Appearance: She is well-developed. She is not diaphoretic.     Comments: BMI 55  HENT:     Head: Normocephalic and atraumatic.     Comments: Swollen turbinates, posterior nasal drip, no noted  sinus ttp, tm normal bilaterally.   Eyes:     Pupils: Pupils are equal, round, and reactive to light.  Cardiovascular:     Rate and Rhythm: Normal rate and regular rhythm.     Heart sounds: No murmur heard.    No friction rub. No gallop.  Pulmonary:     Effort: Pulmonary effort is normal.     Breath sounds: No wheezing or rales.  Abdominal:     General: There is no distension.     Palpations: Abdomen is soft.     Tenderness: There is no abdominal tenderness.  Musculoskeletal:        General: No tenderness.     Cervical back: Normal range of motion and neck supple.  Skin:    General: Skin is warm and dry.  Neurological:     Mental Status: She is alert and oriented to person, place, and time.  Psychiatric:         Behavior: Behavior normal.     ED Results / Procedures / Treatments   Labs (all labs ordered are listed, but only abnormal results are displayed) Labs Reviewed - No data to display  EKG None  Radiology No results found.  Procedures Procedures    Medications Ordered in ED Medications  dexamethasone (DECADRON) tablet 10 mg (has no administration in time range)    ED Course/ Medical Decision Making/ A&P                                 Medical Decision Making Risk Prescription drug management.   52 yo F with a chief complaints of cough congestion subjective fevers and chills.  This been going on for couple days.  Someone at work is also sick with something similar.  She has had a couple loose stools with this.  She has tried her inhaler at home without significant improvement.  She tells me she came to make sure that she was not contagious.  I had a discussion with her about the typical way that viruses are passed.  Will have her follow-up with her family doctor in the office.  2:57 AM:  I have discussed the diagnosis/risks/treatment options with the patient.  Evaluation and diagnostic testing in the emergency department does not suggest an emergent condition requiring admission or immediate intervention beyond what has been performed at this time.  They will follow up with PCP. We also discussed returning to the ED immediately if new or worsening sx occur. We discussed the sx which are most concerning (e.g., sudden worsening pain, fever, inability to tolerate by mouth) that necessitate immediate return. Medications administered to the patient during their visit and any new prescriptions provided to the patient are listed below.  Medications given during this visit Medications  dexamethasone (DECADRON) tablet 10 mg (has no administration in time range)     The patient appears reasonably screen and/or stabilized for discharge and I doubt any other medical condition or other [facility]  requiring further screening, evaluation, or treatment in the ED at this time prior to discharge.          Final Clinical Impression(s) / ED Diagnoses Final diagnoses:  Viral URI with cough    Rx / DC Orders ED Discharge Orders          Ordered    benzonatate (TESSALON) 100 MG capsule  Every 8 hours        08/21/23 0254  ondansetron (ZOFRAN-ODT) 4 MG disintegrating tablet        08/21/23 0254              Melene Plan, DO 08/21/23 [id number]

## 2023-08-21 NOTE — Discharge Instructions (Signed)
Take tylenol 2 pills 4 times a day and motrin 4 pills 3 times a day.  Drink plenty of fluids.  Return for worsening shortness of breath, headache, confusion. Follow up with your family doctor.   

## 2023-08-21 NOTE — ED Triage Notes (Signed)
Pt started to feel unwell Saturday; woke up Sunday with feeling like someone was sitting on her chest. Pt reports non-productive cough. Pt endorses seasonal asthma.

## 2024-01-06 ENCOUNTER — Emergency Department (HOSPITAL_BASED_OUTPATIENT_CLINIC_OR_DEPARTMENT_OTHER)
Admission: EM | Admit: 2024-01-06 | Discharge: 2024-01-06 | Disposition: A | Discharge status: Home / Self Care | Attending: Emergency Medicine | Admitting: Emergency Medicine

## 2024-01-06 ENCOUNTER — Encounter (HOSPITAL_BASED_OUTPATIENT_CLINIC_OR_DEPARTMENT_OTHER): Payer: Self-pay | Admitting: Urology

## 2024-01-06 DIAGNOSIS — Z91040 Latex allergy status: Secondary | ICD-10-CM | POA: Diagnosis not present

## 2024-01-06 DIAGNOSIS — K529 Noninfective gastroenteritis and colitis, unspecified: Secondary | ICD-10-CM | POA: Insufficient documentation

## 2024-01-06 DIAGNOSIS — Z85028 Personal history of other malignant neoplasm of stomach: Secondary | ICD-10-CM | POA: Insufficient documentation

## 2024-01-06 DIAGNOSIS — R112 Nausea with vomiting, unspecified: Secondary | ICD-10-CM | POA: Diagnosis present

## 2024-01-06 LAB — RESP PANEL BY RT-PCR (RSV, FLU A&B, COVID)  RVPGX2
Influenza A by PCR: NEGATIVE
Influenza B by PCR: NEGATIVE
Resp Syncytial Virus by PCR: NEGATIVE
SARS Coronavirus 2 by RT PCR: NEGATIVE

## 2024-01-06 LAB — COMPREHENSIVE METABOLIC PANEL
ALT: 19 U/L (ref 0–44)
AST: 22 U/L (ref 15–41)
Albumin: 3.5 g/dL (ref 3.5–5.0)
Alkaline Phosphatase: 105 U/L (ref 38–126)
Anion gap: 10 (ref 5–15)
BUN: 14 mg/dL (ref 6–20)
CO2: 21 mmol/L — ABNORMAL LOW (ref 22–32)
Calcium: 8.8 mg/dL — ABNORMAL LOW (ref 8.9–10.3)
Chloride: 106 mmol/L (ref 98–111)
Creatinine, Ser: 0.66 mg/dL (ref 0.44–1.00)
GFR, Estimated: >60 mL/min (ref >60)
Glucose, Bld: 154 mg/dL — ABNORMAL HIGH (ref 70–99)
Potassium: 3.4 mmol/L — ABNORMAL LOW (ref 3.5–5.1)
Sodium: 137 mmol/L (ref 135–145)
Total Bilirubin: 0.4 mg/dL (ref 0.0–1.2)
Total Protein: 7.9 g/dL (ref 6.5–8.1)

## 2024-01-06 LAB — C DIFFICILE QUICK SCREEN W PCR REFLEX
C Diff antigen: POSITIVE — AB
C Diff toxin: NEGATIVE

## 2024-01-06 LAB — CBC WITH DIFFERENTIAL/PLATELET
Abs Immature Granulocytes: 0.04 10*3/uL (ref 0.00–0.07)
Basophils Absolute: 0 10*3/uL (ref 0.0–0.1)
Basophils Relative: 0 %
Eosinophils Absolute: 0 10*3/uL (ref 0.0–0.5)
Eosinophils Relative: 0 %
HCT: 38.3 % (ref 36.0–46.0)
Hemoglobin: 12.5 g/dL (ref 12.0–15.0)
Immature Granulocytes: 1 %
Lymphocytes Relative: 6 %
Lymphs Abs: 0.5 10*3/uL — ABNORMAL LOW (ref 0.7–4.0)
MCH: 28.1 pg (ref 26.0–34.0)
MCHC: 32.6 g/dL (ref 30.0–36.0)
MCV: 86.1 fL (ref 80.0–100.0)
Monocytes Absolute: 0.2 10*3/uL (ref 0.1–1.0)
Monocytes Relative: 3 %
Neutro Abs: 7.3 10*3/uL (ref 1.7–7.7)
Neutrophils Relative %: 90 %
Platelets: 278 10*3/uL (ref 150–400)
RBC: 4.45 MIL/uL (ref 3.87–5.11)
RDW: 14.4 % (ref 11.5–15.5)
WBC: 8.1 10*3/uL (ref 4.0–10.5)
nRBC: 0 % (ref 0.0–0.2)

## 2024-01-06 LAB — CLOSTRIDIUM DIFFICILE BY PCR, REFLEXED: Toxigenic C. Difficile by PCR: NEGATIVE

## 2024-01-06 LAB — LIPASE, BLOOD: Lipase: 21 U/L (ref 11–51)

## 2024-01-06 MED ORDER — ONDANSETRON HCL 4 MG/2ML IJ SOLN
4.0000 mg | Freq: Once | INTRAMUSCULAR | Status: AC
  Administered 2024-01-06: 4 mg via INTRAVENOUS
  Filled 2024-01-06: qty 2

## 2024-01-06 MED ORDER — HYOSCYAMINE SULFATE 0.5 MG/ML IJ SOLN: 0.1250 mg | Freq: Once | INTRAMUSCULAR | Status: DC

## 2024-01-06 MED ORDER — ONDANSETRON 4 MG PO TBDP: 4.0000 mg | ORAL_TABLET | Freq: Three times a day (TID) | ORAL | 0 refills | Status: DC | PRN

## 2024-01-06 MED ORDER — SODIUM CHLORIDE 0.9 % IV BOLUS
1000.0000 mL | Freq: Once | INTRAVENOUS | Status: AC
  Administered 2024-01-06: 1000 mL via INTRAVENOUS

## 2024-01-06 MED ORDER — HYOSCYAMINE SULFATE 0.125 MG SL SUBL: 0.1250 mg | SUBLINGUAL_TABLET | Freq: Once | SUBLINGUAL | Status: DC

## 2024-01-06 MED ORDER — DIPHENOXYLATE-ATROPINE 2.5-0.025 MG PO TABS
2.0000 | ORAL_TABLET | Freq: Once | ORAL | Status: AC
  Administered 2024-01-06: 2 via ORAL
  Filled 2024-01-06: qty 2

## 2024-01-06 MED ORDER — HYOSCYAMINE SULFATE 0.125 MG SL SUBL
0.1250 mg | SUBLINGUAL_TABLET | Freq: Once | SUBLINGUAL | Status: AC
  Administered 2024-01-06: 0.125 mg via SUBLINGUAL
  Filled 2024-01-06: qty 1

## 2024-01-06 MED ORDER — MORPHINE SULFATE (PF) 4 MG/ML IV SOLN
4.0000 mg | Freq: Once | INTRAVENOUS | Status: AC
  Administered 2024-01-06: 4 mg via INTRAVENOUS
  Filled 2024-01-06: qty 1

## 2024-01-06 NOTE — ED Notes (Signed)
 Pt unable to provide urine sample.

## 2024-01-06 NOTE — ED Triage Notes (Signed)
 Pt states N/V/D since 0400 this am  Denies fever  States generalized abd pain

## 2024-01-06 NOTE — Discharge Instructions (Signed)
 As we discussed, drink lots of fluids to avoid dehydration. Advance your diet as tolerated. Take Zofran for nausea every 8 hours as needed.   A prescription for Lomotil will be called in when a negative C. Diff test is resulted. If that test is positive, you will receive a call to discuss appropriate treatment.

## 2024-01-06 NOTE — ED Provider Notes (Signed)
 [place] [facility] Provider Note   CSN: [id number] Arrival date & time: 01/06/24  1337     History {Add pertinent medical, surgical, social history, OB history to HPI:1} Chief Complaint  Patient presents with   Emesis    Stacy Hill is a 53 y.o. female.  Patient to ED for evaluation of nausea, vomiting and diarrhea that started this morning around 4:00 am. Since onset she reports multiple episodes of nonbloody emesis and loose stools. No fever. She has abdominal pain that comes and goes, worse just prior to a bowel movement.   The history is provided by the patient. No language interpreter was used.  Emesis      Home Medications Prior to Admission medications   Medication Sig Start Date End Date Taking? Authorizing Provider  ondansetron (ZOFRAN-ODT) 4 MG disintegrating tablet Take 1 tablet (4 mg total) by mouth every 8 (eight) hours as needed for nausea or vomiting. 01/06/24  Yes Deejay Koppelman, PA-C  albuterol (VENTOLIN HFA) 108 (90 Base) MCG/ACT inhaler Inhale 2 puffs into the lungs every 6 (six) hours as needed for wheezing or shortness of breath.    [provider]  benzonatate (TESSALON) 100 MG capsule Take 1 capsule (100 mg total) by mouth every 8 (eight) hours. 08/21/23   Melene Plan, DO  cetirizine (ZYRTEC) 10 MG tablet Take 10 mg by mouth daily as needed for allergies.    [provider]  diphenhydrAMINE (BENADRYL) 25 MG tablet Take 75 mg by mouth at bedtime as needed for allergies.    [provider]  escitalopram (LEXAPRO) 20 MG tablet Take 20 mg by mouth at bedtime. 06/17/22   [provider]  hydrocortisone cream 1 % Apply 1 Application topically 2 (two) times daily as needed for itching (eczema).    [provider]  hydrOXYzine (ATARAX) 50 MG tablet Take 100 mg by mouth at bedtime as needed for itching. 01/15/22   [provider]  Multiple Vitamins-Minerals (BARIATRIC FUSION)  CHEW Chew 2 each by mouth daily.    [provider]  ondansetron (ZOFRAN) 4 MG tablet Take 4 mg by mouth every 8 (eight) hours as needed for nausea or vomiting. Patient not taking: Reported on 01/02/2023    [provider]  pantoprazole (PROTONIX) 40 MG tablet Take 1 tablet (40 mg total) by mouth 2 (two) times daily. 07/14/22 07/14/23  Lynann Bologna, MD  phentermine 37.5 MG capsule Take 37.5 mg by mouth daily. 11/08/17   [provider]  Potassium 99 MG TABS Take 99 mg by mouth daily as needed (cramps).    [provider]  prochlorperazine (COMPAZINE) 10 MG tablet Take 1 tablet by mouth every 6 (six) hours as needed for nausea or vomiting.  12/26/13 09/06/20  [provider]      Allergies    Chocolate flavoring agent (non-screening), Ivp dye [iodinated contrast media], Latex, Shellfish allergy, and Strawberry extract    Review of Systems   Review of Systems  Gastrointestinal:  Positive for vomiting.    Physical Exam Updated Vital Signs BP 112/63   Pulse 96   Temp (!) 97.4 F (36.3 C) (Oral)   Resp 20   Ht 5\' 3"  (1.6 m)   Wt (!) 140.6 kg   LMP 01/10/2013   SpO2 100%   BMI 54.91 kg/m  Physical Exam Vitals and nursing note reviewed.  Constitutional:      General: She is not in acute distress.    Appearance:  She is well-developed. She is obese. She is not ill-appearing.  Pulmonary:     Effort: Pulmonary effort is normal.  Abdominal:     Palpations: Abdomen is soft.     Tenderness: There is no guarding or rebound.     Comments: Periumbilical tenderness to soft abdomen.   Musculoskeletal:        General: Normal range of motion.     Cervical back: Normal range of motion.  Skin:    General: Skin is warm and dry.  Neurological:     Mental Status: She is alert and oriented to person, place, and time.     ED Results / Procedures / Treatments   Labs (all labs ordered are listed, but only abnormal results are displayed) Labs Reviewed   CBC WITH DIFFERENTIAL/PLATELET - Abnormal; Notable for the following components:      Result Value   Lymphs Abs 0.5 (*)    All other components within normal limits  COMPREHENSIVE METABOLIC PANEL - Abnormal; Notable for the following components:   Potassium 3.4 (*)    CO2 21 (*)    Glucose, Bld 154 (*)    Calcium 8.8 (*)    All other components within normal limits  RESP PANEL BY RT-PCR (RSV, FLU A&B, COVID)  [id number]  C DIFFICILE QUICK SCREEN W PCR REFLEX    LIPASE, BLOOD  URINALYSIS, ROUTINE W REFLEX MICROSCOPIC   Results for orders placed or performed during the hospital encounter of 01/06/24  Resp panel by RT-PCR (RSV, Flu A&B, Covid) Anterior Nasal Swab   Collection Time: 01/06/24  1:49 PM   Specimen: Anterior Nasal Swab  Result Value Ref Range   SARS Coronavirus 2 by RT PCR NEGATIVE NEGATIVE   Influenza A by PCR NEGATIVE NEGATIVE   Influenza B by PCR NEGATIVE NEGATIVE   Resp Syncytial Virus by PCR NEGATIVE NEGATIVE  CBC with Differential   Collection Time: 01/06/24  2:15 PM  Result Value Ref Range   WBC 8.1 4.0 - 10.5 K/uL   RBC 4.45 3.87 - 5.11 MIL/uL   Hemoglobin 12.5 12.0 - 15.0 g/dL   HCT 82.9 56.2 - 13.0 %   MCV 86.1 80.0 - 100.0 fL   MCH 28.1 26.0 - 34.0 pg   MCHC 32.6 30.0 - 36.0 g/dL   RDW 86.5 78.4 - 69.6 %   Platelets 278 150 - 400 K/uL   nRBC 0.0 0.0 - 0.2 %   Neutrophils Relative % 90 %   Neutro Abs 7.3 1.7 - 7.7 K/uL   Lymphocytes Relative 6 %   Lymphs Abs 0.5 (L) 0.7 - 4.0 K/uL   Monocytes Relative 3 %   Monocytes Absolute 0.2 0.1 - 1.0 K/uL   Eosinophils Relative 0 %   Eosinophils Absolute 0.0 0.0 - 0.5 K/uL   Basophils Relative 0 %   Basophils Absolute 0.0 0.0 - 0.1 K/uL   Immature Granulocytes 1 %   Abs Immature Granulocytes 0.04 0.00 - 0.07 K/uL  Comprehensive metabolic panel   Collection Time: 01/06/24  2:15 PM  Result Value Ref Range   Sodium 137 135 - 145 mmol/L   Potassium 3.4 (L) 3.5 - 5.1 mmol/L   Chloride 106 98 - 111 mmol/L   CO2  21 (L) 22 - 32 mmol/L   Glucose, Bld 154 (H) 70 - 99 mg/dL   BUN 14 6 - 20 mg/dL   Creatinine, Ser 2.95 0.44 - 1.00 mg/dL   Calcium 8.8 (L) 8.9 - 10.3 mg/dL  Total Protein 7.9 6.5 - 8.1 g/dL   Albumin 3.5 3.5 - 5.0 g/dL   AST 22 15 - 41 U/L   ALT 19 0 - 44 U/L   Alkaline Phosphatase 105 38 - 126 U/L   Total Bilirubin 0.4 0.0 - 1.2 mg/dL   GFR, Estimated >16 >10 mL/min   Anion gap 10 5 - 15  Lipase, blood   Collection Time: 01/06/24  2:15 PM  Result Value Ref Range   Lipase 21 11 - 51 U/L    EKG None  Radiology No results found.  Procedures Procedures  {Document cardiac monitor, telemetry assessment procedure when appropriate:1}  Medications Ordered in ED Medications  sodium chloride 0.9 % bolus 1,000 mL (0 mLs Intravenous Stopped 01/06/24 1535)  ondansetron (ZOFRAN) injection 4 mg (4 mg Intravenous Given 01/06/24 1433)  diphenoxylate-atropine (LOMOTIL) 2.5-0.025 MG per tablet 2 tablet (2 tablets Oral Given 01/06/24 1602)  hyoscyamine (LEVSIN SL) SL tablet 0.125 mg (0.125 mg Sublingual Given 01/06/24 1602)  sodium chloride 0.9 % bolus 1,000 mL (0 mLs Intravenous Stopped 01/06/24 1846)  morphine (PF) 4 MG/ML injection 4 mg (4 mg Intravenous Given 01/06/24 1657)    ED Course/ Medical Decision Making/ A&P   {   Click here for ABCD2, HEART and other calculatorsREFRESH Note before signing :1}                              Medical Decision Making This patient presents to the ED for concern of nausea, vomiting, diarrhea, this involves an extensive number of treatment options, and is a complaint that carries with it a high risk of complications and morbidity.  The differential diagnosis includes viral gastroenteritis, obstruction, bacterial/infectious diarrhea   Co morbidities that complicate the patient evaluation  Multiple abdominal surgeries with history of cancer, gastric sleeve   Additional history obtained:  Additional history and/or information obtained from chart  review, notable for n/a   Lab Tests:  I Ordered, and personally interpreted labs.  The pertinent results include:  No significant electrolyte abnormalities; no leukocytosis, normal hemoglobin; Viral panel is negative    Imaging Studies ordered:  I ordered imaging studies including n/a I independently visualized and interpreted imaging which showed n/a I agree with the radiologist interpretation   Cardiac Monitoring:  The patient was maintained on a cardiac monitor.  I personally viewed and interpreted the cardiac monitored which showed an underlying rhythm of: n/a   Medicines ordered and prescription drug management:  I ordered medication including Zofran, Lomotil  for nausea, diarrhea Reevaluation of the patient after these medicines showed that the patient improved I have reviewed the patients home medicines and have made adjustments as needed   Test Considered:  Abdominal imaging - symptoms/presentation felt to be viral gastroenteritis with no concern for obstruction. Imaging not felt indicated.   Critical Interventions:  N/a   Consultations Obtained:  I requested consultation with the n/a,  and discussed lab and imaging findings as well as pertinent plan - they recommend: n/a   Problem List / ED Course:  Vomiting and diarrhea for the past 12 hours. No fever. No sick contacts. Nonbloody emesis and stool She has had no vomiting in ED, Zofran with relief of nausea. IV fluids provided She feels the diarrhea has slowed since receiving the Lomotil She is stable for discharge.    Reevaluation:  After the interventions noted above, I reevaluated the patient and found that they have :improved  Social Determinants of Health:  N/a   Disposition:  After consideration of the diagnostic results and the patients response to treatment, I feel that the patient would benefit from discharge home. .   Amount and/or Complexity of Data Reviewed Labs:  ordered.  Risk Prescription drug management.     {Document critical care time when appropriate:1} {Document review of labs and clinical decision tools ie heart score, Chads2Vasc2 etc:1}  {Document your independent review of radiology images, and any outside records:1} {Document your discussion with family members, caretakers, and with consultants:1} {Document social determinants of health affecting pt's care:1} {Document your decision making why or why not admission, treatments were needed:1} Final Clinical Impression(s) / ED Diagnoses Final diagnoses:  Gastroenteritis    Rx / DC Orders ED Discharge Orders          Ordered    ondansetron (ZOFRAN-ODT) 4 MG disintegrating tablet  Every 8 hours PRN        01/06/24 1935

## 2024-02-24 ENCOUNTER — Emergency Department (HOSPITAL_BASED_OUTPATIENT_CLINIC_OR_DEPARTMENT_OTHER)
Admission: EM | Admit: 2024-02-24 | Discharge: 2024-02-24 | Disposition: A | Discharge status: Home / Self Care | Attending: Emergency Medicine | Admitting: Emergency Medicine

## 2024-02-24 ENCOUNTER — Emergency Department (HOSPITAL_BASED_OUTPATIENT_CLINIC_OR_DEPARTMENT_OTHER)

## 2024-02-24 ENCOUNTER — Encounter (HOSPITAL_BASED_OUTPATIENT_CLINIC_OR_DEPARTMENT_OTHER): Payer: Self-pay | Admitting: Emergency Medicine

## 2024-02-24 ENCOUNTER — Other Ambulatory Visit: Payer: Self-pay

## 2024-02-24 DIAGNOSIS — J45909 Unspecified asthma, uncomplicated: Secondary | ICD-10-CM | POA: Insufficient documentation

## 2024-02-24 DIAGNOSIS — R1013 Epigastric pain: Secondary | ICD-10-CM | POA: Insufficient documentation

## 2024-02-24 DIAGNOSIS — Z91040 Latex allergy status: Secondary | ICD-10-CM | POA: Insufficient documentation

## 2024-02-24 DIAGNOSIS — Z7951 Long term (current) use of inhaled steroids: Secondary | ICD-10-CM | POA: Diagnosis not present

## 2024-02-24 LAB — COMPREHENSIVE METABOLIC PANEL WITH GFR
ALT: 24 U/L (ref 0–44)
ALT: 21 U/L (ref 0–44)
AST: 31 U/L (ref 15–41)
AST: 22 U/L (ref 15–41)
Albumin: 4.1 g/dL (ref 3.5–5.0)
Albumin: 4 g/dL (ref 3.5–5.0)
Alkaline Phosphatase: 136 U/L — ABNORMAL HIGH (ref 38–126)
Alkaline Phosphatase: 134 U/L — ABNORMAL HIGH (ref 38–126)
Anion gap: 12 (ref 5–15)
Anion gap: 10 (ref 5–15)
BUN: 10 mg/dL (ref 6–20)
BUN: 10 mg/dL (ref 6–20)
CO2: 23 mmol/L (ref 22–32)
CO2: 25 mmol/L (ref 22–32)
Calcium: 9.6 mg/dL (ref 8.9–10.3)
Calcium: 9.7 mg/dL (ref 8.9–10.3)
Chloride: 103 mmol/L (ref 98–111)
Chloride: 104 mmol/L (ref 98–111)
Creatinine, Ser: 0.54 mg/dL (ref 0.44–1.00)
Creatinine, Ser: 0.53 mg/dL (ref 0.44–1.00)
GFR, Estimated: >60 mL/min (ref >60)
GFR, Estimated: >60 mL/min (ref >60)
Glucose, Bld: 87 mg/dL (ref 70–99)
Glucose, Bld: 81 mg/dL (ref 70–99)
Potassium: 4.6 mmol/L (ref 3.5–5.1)
Potassium: 4.2 mmol/L (ref 3.5–5.1)
Sodium: 138 mmol/L (ref 135–145)
Sodium: 139 mmol/L (ref 135–145)
Total Bilirubin: 0.2 mg/dL (ref 0.0–1.2)
Total Bilirubin: <0.2 mg/dL (ref 0.0–1.2)
Total Protein: 8 g/dL (ref 6.5–8.1)
Total Protein: 7.8 g/dL (ref 6.5–8.1)

## 2024-02-24 LAB — CBC WITH DIFFERENTIAL/PLATELET
Abs Immature Granulocytes: 0.04 10*3/uL (ref 0.00–0.07)
Basophils Absolute: 0 10*3/uL (ref 0.0–0.1)
Basophils Relative: 0 %
Eosinophils Absolute: 0.5 10*3/uL (ref 0.0–0.5)
Eosinophils Relative: 5 %
HCT: 35.7 % — ABNORMAL LOW (ref 36.0–46.0)
Hemoglobin: 11.7 g/dL — ABNORMAL LOW (ref 12.0–15.0)
Immature Granulocytes: 0 %
Lymphocytes Relative: 36 %
Lymphs Abs: 3.3 10*3/uL (ref 0.7–4.0)
MCH: 27.9 pg (ref 26.0–34.0)
MCHC: 32.8 g/dL (ref 30.0–36.0)
MCV: 85.2 fL (ref 80.0–100.0)
Monocytes Absolute: 0.4 10*3/uL (ref 0.1–1.0)
Monocytes Relative: 4 %
Neutro Abs: 5.1 10*3/uL (ref 1.7–7.7)
Neutrophils Relative %: 55 %
Platelets: 321 10*3/uL (ref 150–400)
RBC: 4.19 MIL/uL (ref 3.87–5.11)
RDW: 14.7 % (ref 11.5–15.5)
WBC: 9.4 10*3/uL (ref 4.0–10.5)
nRBC: 0 % (ref 0.0–0.2)

## 2024-02-24 LAB — LIPASE, BLOOD
Lipase: 13 U/L (ref 11–51)
Lipase: 12 U/L (ref 11–51)

## 2024-02-24 MED ORDER — ONDANSETRON HCL 4 MG/2ML IJ SOLN
4.0000 mg | Freq: Once | INTRAMUSCULAR | Status: AC
  Administered 2024-02-24: 4 mg via INTRAVENOUS
  Filled 2024-02-24: qty 2

## 2024-02-24 MED ORDER — SODIUM CHLORIDE 0.9 % IV BOLUS
500.0000 mL | Freq: Once | INTRAVENOUS | Status: AC
  Administered 2024-02-24: 500 mL via INTRAVENOUS

## 2024-02-24 MED ORDER — ALUM & MAG HYDROXIDE-SIMETH 200-200-20 MG/5ML PO SUSP
15.0000 mL | Freq: Once | ORAL | Status: AC
  Administered 2024-02-24: 15 mL via ORAL
  Filled 2024-02-24: qty 30

## 2024-02-24 MED ORDER — ONDANSETRON 4 MG PO TBDP: 4.0000 mg | ORAL_TABLET | Freq: Three times a day (TID) | ORAL | 0 refills | Status: AC | PRN

## 2024-02-24 NOTE — ED Provider Notes (Signed)
 [place] [facility] Provider Note   CSN: [id number] Arrival date & time: 02/24/24  1239     History {Add pertinent medical, surgical, social history, OB history to HPI:1} Chief Complaint  Patient presents with   Abdominal Pain    Stacy Hill is a 53 y.o. female.   Abdominal Pain Patient presents abdominal pain.  Began earlier today.  States she normally gets up and eats and has immediately go to the bathroom but states she did not have to do that today.  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  Has had previous bowel obstruction.  Does have chronic irritable bowel however.  Now pain in upper abdomen.  No fevers or chills.  Nausea but not vomiting.    Past Medical History:  Diagnosis Date   Anemia    Anxiety    Last visit to [facility] 2008 gave birth to stillborn, pt teary today discussing   Asthma    seasonal   Bowel obstruction (HCC)    Cancer (HCC) 2015   gist removed 3 years chemo   Depression    Fibroid    Gastrointestinal stromal tumor (GIST) (HCC)    GERD (gastroesophageal reflux disease)    Takes Tums prn.   Headache(784.0)    IBS (irritable bowel syndrome)    Nausea and vomiting 09/15/2014   Nausea vomiting and diarrhea    Obesity    Restless leg syndrome    Seasonal allergies    Shortness of breath    On exhertion,recently,with anemia per pt.   Sleep apnea    Stromal tumor of digestive system [facility])    Past Surgical History:  Procedure Laterality Date   ABDOMINAL HYSTERECTOMY N/A 02/13/2013   Procedure: HYSTERECTOMY ABDOMINAL WITH BILATERAL SALPINGECTOMY WITH EXTENSIVE LYSIS OF ADHESIONS;  Surgeon: Marie-Lyne Lavoie, MD;  Location: [facility];  Service: Gynecology;  Laterality: N/A;   BIOPSY  07/14/2022   Procedure: BIOPSY;  Surgeon: Lajuan Pila, MD;  Location: [facility];  Service: Gastroenterology;;   BIOPSY  01/02/2023   Procedure: BIOPSY;  Surgeon: Lajuan Pila, MD;  Location: [facility];  Service: Gastroenterology;;   CESAREAN SECTION     CHOLECYSTECTOMY     COLONOSCOPY WITH PROPOFOL  N/A 07/14/2022   Procedure: COLONOSCOPY WITH PROPOFOL ;  Surgeon: Lajuan Pila, MD;  Location: [facility];  Service: Gastroenterology;  Laterality: N/A;   ESOPHAGOGASTRODUODENOSCOPY (EGD) WITH PROPOFOL  N/A 07/14/2022   Procedure: ESOPHAGOGASTRODUODENOSCOPY (EGD) WITH PROPOFOL ;  Surgeon: Lajuan Pila, MD;  Location: [facility];  Service: Gastroenterology;  Laterality: N/A;   ESOPHAGOGASTRODUODENOSCOPY (EGD) WITH PROPOFOL  N/A 01/02/2023   Procedure: ESOPHAGOGASTRODUODENOSCOPY (EGD) WITH PROPOFOL ;  Surgeon: Lajuan Pila, MD;  Location: [facility];  Service: Gastroenterology;  Laterality: N/A;   GASTRECTOMY     partial   LAPAROSCOPIC GASTRIC SLEEVE RESECTION       Home Medications Prior to Admission medications   Medication Sig Start Date End Date Taking? Authorizing Provider  albuterol  (VENTOLIN  HFA) 108 (90 Base) MCG/ACT inhaler Inhale 2 puffs into the lungs every 6 (six) hours as needed for wheezing or shortness of breath.    [provider]  benzonatate  (TESSALON ) 100 MG capsule Take 1 capsule (100 mg total) by mouth every 8 (eight) hours. 08/21/23   Albertus Hughs, DO  cetirizine (ZYRTEC) 10 MG tablet Take 10 mg by mouth daily as needed for allergies.    [provider]  diphenhydrAMINE  (BENADRYL ) 25 MG tablet Take 75  mg by mouth at bedtime as needed for allergies.    [provider]  escitalopram (LEXAPRO) 20 MG tablet Take 20 mg by mouth at bedtime. 06/17/22   [provider]  hydrocortisone cream 1 % Apply 1 Application topically 2 (two) times daily as needed for itching (eczema).    [provider]  hydrOXYzine (ATARAX) 50 MG tablet Take 100 mg by mouth at bedtime as needed for itching. 01/15/22   [provider]  Multiple Vitamins-Minerals (BARIATRIC FUSION) CHEW Chew 2 each by mouth daily.    [provider]  ondansetron  (ZOFRAN ) 4 MG tablet Take 4 mg by mouth every 8 (eight) hours as needed for nausea or vomiting. Patient not taking: Reported on 01/02/2023    [provider]  ondansetron  (ZOFRAN -ODT) 4 MG disintegrating tablet Take 1 tablet (4 mg total) by mouth every 8 (eight) hours as needed for nausea or vomiting. 01/06/24   Scarlette Currier, MD  pantoprazole  (PROTONIX ) 40 MG tablet Take 1 tablet (40 mg total) by mouth 2 (two) times daily. 07/14/22 07/14/23  Lajuan Pila, MD  phentermine 37.5 MG capsule Take 37.5 mg by mouth daily. 11/08/17   [provider]  Potassium 99 MG TABS Take 99 mg by mouth daily as needed (cramps).    [provider]  prochlorperazine (COMPAZINE) 10 MG tablet Take 1 tablet by mouth every 6 (six) hours as needed for nausea or vomiting.  12/26/13 09/06/20  [provider]      Allergies    Chocolate flavoring agent (non-screening), Ivp dye [iodinated contrast media], Latex, Shellfish allergy, and Strawberry extract    Review of Systems   Review of Systems  Gastrointestinal:  Positive for abdominal pain.    Physical Exam Updated Vital Signs BP (!) 122/92 (BP Location: Left Wrist)   Pulse 93   Temp 97.8 F (36.6 C) (Oral)   Resp 20   Ht 5\' 3"  (1.6 m)   Wt (!) 140.6 kg   LMP 01/10/2013   SpO2 100%   BMI 54.91 kg/m  Physical Exam Vitals and nursing note reviewed.  Constitutional:      Appearance: She is obese.  Cardiovascular:     Rate and Rhythm: Normal rate.  Abdominal:     Tenderness: There is abdominal tenderness.     Comments: Tenderness upper abdomen.  No hernia palpated.  Skin:    General: Skin is warm.  Neurological:     Mental Status: She is alert.     ED Results / Procedures / Treatments   Labs (all labs ordered are listed, but only abnormal results are displayed) Labs Reviewed  CBC WITH DIFFERENTIAL/PLATELET - Abnormal; Notable for the following components:      Result Value   Hemoglobin  11.7 (*)    HCT 35.7 (*)    All other components within normal limits  LIPASE, BLOOD  COMPREHENSIVE METABOLIC PANEL WITH GFR  URINALYSIS, ROUTINE W REFLEX MICROSCOPIC    EKG None  Radiology No results found.  Procedures Procedures  {Document cardiac monitor, telemetry assessment procedure when appropriate:1}  Medications Ordered in ED Medications  sodium chloride  0.9 % bolus 500 mL (has no administration in time range)  ondansetron  (ZOFRAN ) injection 4 mg (has no administration in time range)    ED Course/ Medical Decision Making/ A&P   {   Click here for ABCD2, HEART and other calculatorsREFRESH Note before signing :1}  Medical Decision Making Amount and/or Complexity of Data Reviewed Labs: ordered. Radiology: ordered.  Risk Prescription drug management.   Patient upper abdominal pain.  Nausea and decreased bowel movements.  Has had previous ileus's but also bowel obstruction.  Will get CT scan to evaluate.  Does have IV dye allergy.  Can get scans with premedication we will do noncontrast at this time.  Will also get basic blood work and give antiemetics.  Care turned over to oncoming provider.  {Document critical care time when appropriate:1} {Document review of labs and clinical decision tools ie heart score, Chads2Vasc2 etc:1}  {Document your independent review of radiology images, and any outside records:1} {Document your discussion with family members, caretakers, and with consultants:1} {Document social determinants of health affecting pt's care:1} {Document your decision making why or why not admission, treatments were needed:1} Final Clinical Impression(s) / ED Diagnoses Final diagnoses:  None    Rx / DC Orders ED Discharge Orders     None

## 2024-02-24 NOTE — Discharge Instructions (Addendum)
 We evaluated you for your abdominal pain.  Your CT scan did not show any dangerous findings.  Your symptoms could be due to inflammation in your stomach, I would recommend continuing your omeprazole .  Please follow-up closely with your primary doctor.  If you have any new or worsening symptoms, such as worsening pain, uncontrolled vomiting, fevers or chills, or any other new symptoms, please return to the emergency department for reassessment.

## 2024-02-24 NOTE — ED Triage Notes (Signed)
 Pt c/o middle upper abd pain since this morning; took 2 ES Tylenol  Arthritis w/ no relief; hx of ileus; denies NVD

## 2024-02-24 NOTE — ED Notes (Signed)

## 2024-02-24 NOTE — ED Provider Notes (Signed)
  ED Course / MDM   Clinical Course as of 02/24/24 1743  Sat Feb 24, 2024  1502 Received sign out from Dr. Val Garin pending CT scan. Presenting with abdominal pain.  [WS]  1743 CT scan without evidence of any acute process.  Discussed findings with the patient.  She reports that she does feel better after receiving medication.  Patient received Maalox and thinks this helped some.  She does note she has been off her omeprazole  recently and has just restarted it.  Suspect most likely causes gastritis, recommended follow-up with primary doctor.  Will prescribe Zofran . Will discharge patient to home. All questions answered. Patient comfortable with plan of discharge. Return precautions discussed with patient and specified on the after visit summary.  [WS]    Clinical Course User Index [WS] Mordecai Applebaum, MD   Medical Decision Making Amount and/or Complexity of Data Reviewed Labs: ordered. Radiology: ordered.  Risk OTC drugs. Prescription drug management.         Mordecai Applebaum, MD 02/24/24 [id number]

## 2024-02-24 NOTE — ED Notes (Signed)
 Need additional urine for u/a per lab

## 2024-02-24 NOTE — ED Notes (Signed)
Attempted IV x 2 labs obtained.

## 2024-02-24 NOTE — ED Notes (Signed)
 ED Provider at bedside.

## 2024-02-24 NOTE — ED Notes (Signed)
 Pt unable to urinate at this time. Aware a specimen is needed.

## 2024-05-09 ENCOUNTER — Emergency Department (HOSPITAL_BASED_OUTPATIENT_CLINIC_OR_DEPARTMENT_OTHER)
Admission: EM | Admit: 2024-05-09 | Discharge: 2024-05-09 | Disposition: A | Discharge status: Home / Self Care | Attending: Emergency Medicine | Admitting: Emergency Medicine

## 2024-05-09 ENCOUNTER — Encounter (HOSPITAL_BASED_OUTPATIENT_CLINIC_OR_DEPARTMENT_OTHER): Payer: Self-pay

## 2024-05-09 ENCOUNTER — Other Ambulatory Visit: Payer: Self-pay

## 2024-05-09 DIAGNOSIS — Z91040 Latex allergy status: Secondary | ICD-10-CM | POA: Insufficient documentation

## 2024-05-09 DIAGNOSIS — T7840XA Allergy, unspecified, initial encounter: Secondary | ICD-10-CM | POA: Insufficient documentation

## 2024-05-09 LAB — BASIC METABOLIC PANEL WITH GFR
Anion gap: 11 (ref 5–15)
BUN: 12 mg/dL (ref 6–20)
CO2: 24 mmol/L (ref 22–32)
Calcium: 9.3 mg/dL (ref 8.9–10.3)
Chloride: 106 mmol/L (ref 98–111)
Creatinine, Ser: 0.66 mg/dL (ref 0.44–1.00)
GFR, Estimated: >60 mL/min
Glucose, Bld: 92 mg/dL (ref 70–99)
Potassium: 3.9 mmol/L (ref 3.5–5.1)
Sodium: 141 mmol/L (ref 135–145)

## 2024-05-09 LAB — CBC WITH DIFFERENTIAL/PLATELET
Abs Immature Granulocytes: 0.03 K/uL (ref 0.00–0.07)
Basophils Absolute: 0 K/uL (ref 0.0–0.1)
Basophils Relative: 0 %
Eosinophils Absolute: 0.4 K/uL (ref 0.0–0.5)
Eosinophils Relative: 5 %
HCT: 34.4 % — ABNORMAL LOW (ref 36.0–46.0)
Hemoglobin: 11.3 g/dL — ABNORMAL LOW (ref 12.0–15.0)
Immature Granulocytes: 0 %
Lymphocytes Relative: 38 %
Lymphs Abs: 3.2 K/uL (ref 0.7–4.0)
MCH: 27.9 pg (ref 26.0–34.0)
MCHC: 32.8 g/dL (ref 30.0–36.0)
MCV: 84.9 fL (ref 80.0–100.0)
Monocytes Absolute: 0.4 K/uL (ref 0.1–1.0)
Monocytes Relative: 4 %
Neutro Abs: 4.4 K/uL (ref 1.7–7.7)
Neutrophils Relative %: 53 %
Platelets: 315 K/uL (ref 150–400)
RBC: 4.05 MIL/uL (ref 3.87–5.11)
RDW: 14.6 % (ref 11.5–15.5)
WBC: 8.4 K/uL (ref 4.0–10.5)
nRBC: 0 % (ref 0.0–0.2)

## 2024-05-09 LAB — HCG, SERUM, QUALITATIVE: Preg, Serum: NEGATIVE

## 2024-05-09 MED ORDER — DIPHENHYDRAMINE HCL 50 MG/ML IJ SOLN
25.0000 mg | Freq: Once | INTRAMUSCULAR | Status: AC
  Administered 2024-05-09: 25 mg via INTRAVENOUS
  Filled 2024-05-09: qty 1

## 2024-05-09 MED ORDER — EPINEPHRINE 0.3 MG/0.3ML IJ SOAJ: 0.3000 mg | INTRAMUSCULAR | 0 refills | Status: AC | PRN

## 2024-05-09 MED ORDER — FAMOTIDINE IN NACL 20-0.9 MG/50ML-% IV SOLN
INTRAVENOUS | Status: AC
  Filled 2024-05-09: qty 50

## 2024-05-09 MED ORDER — EPINEPHRINE 0.3 MG/0.3ML IJ SOAJ
0.3000 mg | Freq: Once | INTRAMUSCULAR | Status: AC
  Administered 2024-05-09: 0.3 mg via INTRAMUSCULAR
  Filled 2024-05-09: qty 0.3

## 2024-05-09 MED ORDER — METHYLPREDNISOLONE SODIUM SUCC 125 MG IJ SOLR
125.0000 mg | Freq: Once | INTRAMUSCULAR | Status: AC
  Administered 2024-05-09: 125 mg via INTRAVENOUS
  Filled 2024-05-09: qty 2

## 2024-05-09 NOTE — ED Notes (Addendum)
 Pt is resting at this time with no complaints.

## 2024-05-09 NOTE — Discharge Instructions (Addendum)
 You were seen in the emergency department for an allergic reaction We gave you dose of an EpiPen  and steroids and Benadryl  here We watched you for several hours and your symptoms did not recur We have called in a prescription for an EpiPen  for you to pick up in your pharmacy in the event you have severe allergic symptoms again Remember that if you use the EpiPen  you need to call 911 or come to the emergency department immediately Follow-up with your primary care doctor within 1 week for reevaluation

## 2024-05-09 NOTE — ED Provider Notes (Signed)
 [place] [facility] Provider Note   CSN: [id number] Arrival date & time: 05/09/24  1758     Patient presents with: Allergic Reaction   Stacy Hill is a 53 y.o. female.  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  No clear trigger today.  Time of onset approximately 4 PM.  Symptoms include lip swelling, scratchy throat diffuse itching.  Has been hospitalized for severe allergic reaction as a child.  No nausea vomiting shortness of breath.  Took hydroxyzine prior to arrival.  No other medications prior to arrival.  Is never required intubation    Allergic Reaction      Prior to Admission medications   Medication Sig Start Date End Date Taking? Authorizing Provider  EPINEPHrine  0.3 mg/0.3 mL IJ SOAJ injection Inject 0.3 mg into the muscle as needed for anaphylaxis. 05/09/24  Yes Pamella Ozell LABOR, DO  albuterol  (VENTOLIN  HFA) 108 (90 Base) MCG/ACT inhaler Inhale 2 puffs into the lungs every 6 (six) hours as needed for wheezing or shortness of breath.    [provider]  benzonatate  (TESSALON ) 100 MG capsule Take 1 capsule (100 mg total) by mouth every 8 (eight) hours. 08/21/23   Emil Share, DO  cetirizine (ZYRTEC) 10 MG tablet Take 10 mg by mouth daily as needed for allergies.    [provider]  diphenhydrAMINE  (BENADRYL ) 25 MG tablet Take 75 mg by mouth at bedtime as needed for allergies.    [provider]  escitalopram (LEXAPRO) 20 MG tablet Take 20 mg by mouth at bedtime. 06/17/22   [provider]  hydrocortisone cream 1 % Apply 1 Application topically 2 (two) times daily as needed for itching (eczema).    [provider]  hydrOXYzine (ATARAX) 50 MG tablet Take 100 mg by mouth at bedtime as needed for itching. 01/15/22   [provider]  Multiple  Vitamins-Minerals (BARIATRIC FUSION) CHEW Chew 2 each by mouth daily.    [provider]  ondansetron  (ZOFRAN -ODT) 4 MG disintegrating tablet Take 1 tablet (4 mg total) by mouth every 8 (eight) hours as needed for nausea or vomiting. 02/24/24   Francesca Elsie CROME, MD  pantoprazole  (PROTONIX ) 40 MG tablet Take 1 tablet (40 mg total) by mouth 2 (two) times daily. 07/14/22 07/14/23  Charlanne Groom, MD  phentermine 37.5 MG capsule Take 37.5 mg by mouth daily. 11/08/17   [provider]  Potassium 99 MG TABS Take 99 mg by mouth daily as needed (cramps).    [provider]  prochlorperazine (COMPAZINE) 10 MG tablet Take 1 tablet by mouth every 6 (six) hours as needed for nausea or vomiting.  12/26/13 09/06/20  [provider]    Allergies: Chocolate flavoring agent (non-screening), Ivp dye [iodinated contrast media], Latex, Shellfish allergy, and Strawberry extract    Review of Systems  Updated Vital Signs BP 132/83   Pulse 92   Temp 98 F (36.7 C)   Resp 18   LMP 01/10/2013   SpO2 98%   Physical Exam Vitals and nursing note reviewed.  HENT:     Head: Normocephalic and atraumatic.     Comments: Swelling of the upper lip Uvula midline No airway swelling or tongue swelling Eyes:     Pupils: Pupils are equal, round, and reactive to light.  Cardiovascular:     Rate and Rhythm: Normal rate and regular rhythm.  Pulmonary:     Effort: Pulmonary effort is normal.     Breath sounds: Normal breath sounds.  Abdominal:     Palpations: Abdomen is soft.     Tenderness: There is no abdominal tenderness.  Skin:    General: Skin is warm and dry.     Findings: No rash.  Neurological:     Mental Status: She is alert.  Psychiatric:        Mood and Affect: Mood normal.     (all labs ordered are listed, but only abnormal results are displayed) Labs Reviewed  CBC WITH DIFFERENTIAL/PLATELET - Abnormal; Notable for the following components:      Result Value    Hemoglobin 11.3 (*)    HCT 34.4 (*)    All other components within normal limits  BASIC METABOLIC PANEL WITH GFR  HCG, SERUM, QUALITATIVE    EKG: None  Radiology: No results found.   Procedures   Medications Ordered in the ED  EPINEPHrine  (EPI-PEN) injection 0.3 mg (0.3 mg Intramuscular Given 05/09/24 1833)  diphenhydrAMINE  (BENADRYL ) injection 25 mg (25 mg Intravenous Given 05/09/24 1830)  methylPREDNISolone  sodium succinate (SOLU-MEDROL ) 125 mg/2 mL injection 125 mg (125 mg Intravenous Given 05/09/24 1830)  diphenhydrAMINE  (BENADRYL ) injection 25 mg (25 mg Intravenous Given 05/09/24 2200)    Clinical Course as of 05/09/24 2305  Thu May 09, 2024  2303 Patient reports resolution in her symptoms.  Lip swelling has gone down.  Will discharge with EpiPen  and instruction for PCP follow-up.  Return precautions that would be worrisome for severe allergic reaction were discussed in detail. [MP]    Clinical Course User Index [MP] Pamella Ozell LABOR, DO                                 Medical Decision Making 53 year old female with history as above presenting after suspected allergic reaction to unknown trigger.  Lip swelling from scratching and itching feels similar to prior allergic reactions.  No rash.  No GI symptoms.  Airway intact without respiratory distress.  Given lip swelling sensation of scratchy throat will give epi Solu-Medrol  and Benadryl  here continue to observe.  Amount and/or Complexity of Data Reviewed Labs: ordered.  Risk Prescription drug management.        Final diagnoses:  Allergic reaction, initial encounter    ED Discharge Orders          Ordered    EPINEPHrine  0.3 mg/0.3 mL IJ SOAJ injection  As needed        05/09/24 2304               Pamella Ozell LABOR, DO 05/09/24 2305

## 2024-05-09 NOTE — ED Notes (Signed)
 ED Provider at bedside.

## 2024-05-09 NOTE — ED Triage Notes (Signed)
 Pt presents with lip swelling and generalized itching. No airway compromised as this time. States she had a sandwich earlier but unsure if that is cause.
